# Patient Record
Sex: Male | Born: 1955 | Race: White | Hispanic: No | Marital: Single | Smoking: Former smoker
Health system: Southern US, Community
[De-identification: ages and names within clinical notes are randomized; demographics above are authoritative.]

## PROBLEM LIST (undated history)

## (undated) DIAGNOSIS — M25569 Pain in unspecified knee: Secondary | ICD-10-CM

## (undated) DIAGNOSIS — F410 Panic disorder [episodic paroxysmal anxiety] without agoraphobia: Secondary | ICD-10-CM

## (undated) DIAGNOSIS — T783XXA Angioneurotic edema, initial encounter: Secondary | ICD-10-CM

## (undated) DIAGNOSIS — J069 Acute upper respiratory infection, unspecified: Secondary | ICD-10-CM

## (undated) DIAGNOSIS — F329 Major depressive disorder, single episode, unspecified: Secondary | ICD-10-CM

## (undated) DIAGNOSIS — F32A Depression, unspecified: Secondary | ICD-10-CM

## (undated) DIAGNOSIS — L309 Dermatitis, unspecified: Secondary | ICD-10-CM

## (undated) DIAGNOSIS — M549 Dorsalgia, unspecified: Secondary | ICD-10-CM

## (undated) DIAGNOSIS — J45909 Unspecified asthma, uncomplicated: Secondary | ICD-10-CM

## (undated) DIAGNOSIS — M1990 Unspecified osteoarthritis, unspecified site: Secondary | ICD-10-CM

## (undated) DIAGNOSIS — F988 Other specified behavioral and emotional disorders with onset usually occurring in childhood and adolescence: Secondary | ICD-10-CM

## (undated) DIAGNOSIS — Z87442 Personal history of urinary calculi: Secondary | ICD-10-CM

## (undated) DIAGNOSIS — G4700 Insomnia, unspecified: Secondary | ICD-10-CM

## (undated) DIAGNOSIS — K649 Unspecified hemorrhoids: Secondary | ICD-10-CM

## (undated) DIAGNOSIS — I38 Endocarditis, valve unspecified: Secondary | ICD-10-CM

## (undated) DIAGNOSIS — J449 Chronic obstructive pulmonary disease, unspecified: Secondary | ICD-10-CM

## (undated) HISTORY — DX: Unspecified osteoarthritis, unspecified site: M19.90

## (undated) HISTORY — DX: Dermatitis, unspecified: L30.9

## (undated) HISTORY — DX: Panic disorder (episodic paroxysmal anxiety): F41.0

## (undated) HISTORY — DX: Unspecified hemorrhoids: K64.9

## (undated) HISTORY — DX: Endocarditis, valve unspecified: I38

## (undated) HISTORY — DX: Insomnia, unspecified: G47.00

## (undated) HISTORY — DX: Other specified behavioral and emotional disorders with onset usually occurring in childhood and adolescence: F98.8

## (undated) HISTORY — DX: Dorsalgia, unspecified: M54.9

## (undated) HISTORY — DX: Acute upper respiratory infection, unspecified: J06.9

## (undated) HISTORY — DX: Chronic obstructive pulmonary disease, unspecified: J44.9

## (undated) HISTORY — DX: Pain in unspecified knee: M25.569

## (undated) HISTORY — DX: Depression, unspecified: F32.A

## (undated) HISTORY — DX: Major depressive disorder, single episode, unspecified: F32.9

## (undated) HISTORY — DX: Angioneurotic edema, initial encounter: T78.3XXA

---

## 2014-02-10 ENCOUNTER — Ambulatory Visit (HOSPITAL_COMMUNITY)
Admission: RE | Admit: 2014-02-10 | Discharge: 2014-02-10 | Disposition: A | Discharge status: Home / Self Care | Payer: Medicaid Other | Source: Ambulatory Visit | Attending: Internal Medicine | Admitting: Internal Medicine

## 2014-02-10 ENCOUNTER — Other Ambulatory Visit (HOSPITAL_COMMUNITY): Payer: Self-pay | Admitting: Internal Medicine

## 2014-02-10 DIAGNOSIS — F17200 Nicotine dependence, unspecified, uncomplicated: Secondary | ICD-10-CM | POA: Insufficient documentation

## 2014-02-10 DIAGNOSIS — J449 Chronic obstructive pulmonary disease, unspecified: Secondary | ICD-10-CM

## 2014-02-10 DIAGNOSIS — J4489 Other specified chronic obstructive pulmonary disease: Secondary | ICD-10-CM | POA: Insufficient documentation

## 2015-09-26 ENCOUNTER — Telehealth: Payer: Self-pay

## 2015-09-26 NOTE — Telephone Encounter (Signed)
Tried to call pt to make appt. He was referred by Dr. Legrand Rams for colonoscopy. See med list, needs OV.

## 2015-09-27 NOTE — Telephone Encounter (Signed)
Pt called and he does have some problems with hemorrhoids and also needs ov due to med list. OV with Walden Field, NP for 10/16/2015 at 8:30 Am.

## 2015-10-16 ENCOUNTER — Ambulatory Visit (INDEPENDENT_AMBULATORY_CARE_PROVIDER_SITE_OTHER): Payer: Medicaid Other | Admitting: Nurse Practitioner

## 2015-10-16 ENCOUNTER — Other Ambulatory Visit: Payer: Self-pay

## 2015-10-16 ENCOUNTER — Encounter: Payer: Self-pay | Admitting: Nurse Practitioner

## 2015-10-16 VITALS — BP 109/72 | HR 76 | Temp 97.6°F | Ht 73.0 in | Wt 266.0 lb

## 2015-10-16 DIAGNOSIS — Z1211 Encounter for screening for malignant neoplasm of colon: Secondary | ICD-10-CM

## 2015-10-16 DIAGNOSIS — K649 Unspecified hemorrhoids: Secondary | ICD-10-CM | POA: Diagnosis not present

## 2015-10-16 HISTORY — PX: OTHER SURGICAL HISTORY: SHX169

## 2015-10-16 MED ORDER — PEG 3350-KCL-NA BICARB-NACL 420 G PO SOLR: 4000.0000 mL | ORAL | Status: DC

## 2015-10-16 MED ORDER — HYDROCORTISONE 2.5 % RE CREA: 1.0000 "application " | TOPICAL_CREAM | Freq: Two times a day (BID) | RECTAL | Status: DC

## 2015-10-16 NOTE — Patient Instructions (Signed)
1. We will schedule your procedure for you. 2. I sent in a prescription to your pharmacy for Anusol cream. Apply this twice a day for hemorrhoid symptoms for up to 10 days at a time. 3. Return for follow-up as needed for any new or worsening stomach or colon symptoms.

## 2015-10-16 NOTE — Progress Notes (Signed)
Primary Care Physician:  [name], MD Primary Gastroenterologist:  Dr. Gala Romney  Chief Complaint  Patient presents with  . set up TCS    HPI:   59 year old male presents for evaluation for screening colonoscopy. PCP notes reviewed. Patient brought in for office visit due to hemorrhoids symptoms as well as medication list which may necessitate additional sedation for his procedure. No record of colonoscopy found in our system nor provided by PCP. Today he states he has never had a colonoscopy before. Denies abdominal pain, N/V. Has hemorrhoids without hematochezia. Denies melena. Has some anal leakage with gas for the past 8-10 years. Denies fever, chills, unintentional weight loss. Has hemorrhoids which bother him occasionally with rectal irritation/pain occasionally. Denies chest pain, dyspnea, dizziness, lightheadedness, syncope, near syncope. Denies any other upper or lower GI symptoms.  Past Medical History  Diagnosis Date  . Hemorrhoids   . COPD (chronic obstructive pulmonary disease) ([place])   . ADD (attention deficit disorder)   . Arthritis   . Back pain   . Knee pain   . 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 Depression   . Panic attacks   . Insomnia     Past Surgical History  Procedure Laterality Date  . None to date  10/16/15    Current Outpatient Prescriptions  Medication Sig Dispense Refill  . albuterol (PROVENTIL) (2.5 MG/3ML) 0.083% nebulizer solution Take 2.5 mg by nebulization every 6 (six) hours as needed for wheezing or shortness of breath.    . citalopram (CELEXA) 20 MG tablet Take 20 mg by mouth daily.    . cyclobenzaprine (FLEXERIL) 10 MG tablet Take 10 mg by mouth 3 (three) times daily.    . fluticasone (FLONASE) 50 MCG/ACT nasal spray Place 1 spray into both nostrils daily.    . Fluticasone-Salmeterol (ADVAIR) 250-50 MCG/DOSE AEPB Inhale 2 puffs into the lungs 2 (two) times daily.    . Influenza Virus Vacc  Split PF (AFLURIA PRESERVATIVE FREE) 0.5 ML SUSY Inject into the muscle.    . Influenza Virus Vaccine Split (AFLURIA) SUSP Inject 0.5 mLs into the muscle.    . loratadine (CLARITIN) 10 MG tablet Take 10 mg by mouth daily.    . meloxicam (MOBIC) 15 MG tablet Take 15 mg by mouth daily.    Marland Kitchen tiotropium (SPIRIVA) 18 MCG inhalation capsule Place 18 mcg into inhaler and inhale daily.    . traZODone (DESYREL) 150 MG tablet Take 100 mg by mouth at bedtime as needed for sleep.     No current facility-administered medications for this visit.    Allergies as of 10/16/2015 - Review Complete 10/16/2015  Allergen Reaction Noted  . Iodine  10/16/2015  . Shellfish allergy Swelling 10/16/2015    Family History  Problem Relation Age of Onset  . Colon cancer Neg Hx     Social History   Social History  . Marital Status: Single    Spouse Name: N/A  . Number of Children: N/A  . Years of Education: N/A   Occupational History  . Not on file.   Social History Main Topics  . Smoking status: Current Every Day Smoker -- 1.00 packs/day for 45 years    Types: Cigarettes  . Smokeless tobacco: Never Used  . 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 Drug  Use: No  . Sexual Activity: Not on file   Other Topics Concern  . Not on file   Social History Narrative  . No narrative on file    Review of Systems: General: Negative for anorexia, weight loss, fever, chills, fatigue, weakness. Eyes: Negative for vision changes.  ENT: Negative for hoarseness, difficulty swallowing. CV: Negative for chest pain, angina, palpitations, peripheral edema.  Respiratory: Negative for dyspnea at rest, worsening dyspnea on exertion, cough, sputum, wheezing (has baseline COPD).  GI: See history of present illness. Derm: Negative for rash or itching.  Endo: Negative for unusual weight change.  Heme:  Negative for bruising or bleeding. Allergy: Negative for rash or hives.    Physical Exam: BP 109/72 mmHg  Pulse 76  Temp(Src) 97.6 F (36.4 C)  Ht 6\' 1"  (1.854 m)  Wt 266 lb (120.657 kg)  BMI 35.10 kg/m2 General:   Morbidly obese male, alert and oriented. Pleasant and cooperative. Well-nourished and well-developed.  Head:  Normocephalic and atraumatic. Eyes:  Without icterus, sclera clear and conjunctiva pink.  Ears:  Normal auditory acuity. Cardiovascular:  S1, S2 present without murmurs appreciated. Extremities without clubbing or edema. Respiratory:  Clear to auscultation bilaterally. Bilateral wheezes, no rales or rhonchi. No distress.  Gastrointestinal:  +BS, large but soft, non-tender and non-distended. No HSM noted. No guarding or rebound. No masses appreciated.  Rectal:  Deferred  Neurologic:  Alert and oriented x4;  grossly normal neurologically. Psych:  Alert and cooperative. Normal mood and affect. Heme/Lymph/Immune: No excessive bruising noted.    10/16/2015 9:04 AM

## 2015-10-17 NOTE — Progress Notes (Signed)
cc'ed to pcp °

## 2015-10-17 NOTE — Assessment & Plan Note (Signed)
Patient with hemorrhoids and occasional flareups. We'll provide for Anusol cream to use as needed for symptoms. Return for follow-up as needed.

## 2015-10-17 NOTE — Assessment & Plan Note (Signed)
Patient due for colonoscopy, is generally asymptomatic from a GI standpoint. We'll move forward with screening colonoscopy in the OR/propofol due to polypharmacy  Proceed with TCS in the OR with propofol/MAC with Dr. Gala Romney in near future: the risks, benefits, and alternatives have been discussed with the patient in detail. The patient states understanding and desires to proceed.  Patient is not on any anticoagulants. The patient is on trazodone, Flexeril, and Celexa. We'll provide for the procedure and the OR with propofol/MAC to promote adequate sedation.

## 2015-10-23 NOTE — Patient Instructions (Signed)
Stephen Warren  10/23/2015     @PREFPERIOPPHARMACY @   Your procedure is scheduled on 10/30/15.  Report to [facility] at 8:30 A.M.  Call this number if you have problems the morning of surgery:  [phone]   Remember:  Do not eat food or drink liquids after midnight.  Take these medicines the morning of surgery with A SIP OF WATER Albuterol (bring to hospital), Celexa, Flexeril, Flonase, Ativan, Claritin, Mobic, Spiriva   Do not wear jewelry, make-up or nail polish.  Do not wear lotions, powders, or perfumes.  You may wear deodorant.  Do not shave 48 hours prior to surgery.  Men may shave face and neck.  Do not bring valuables to the hospital.  [facility] is not responsible for any belongings or valuables.  Contacts, dentures or bridgework may not be worn into surgery.  Leave your suitcase in the car.  After surgery it may be brought to your room.  For patients admitted to the hospital, discharge time will be determined by your treatment team.  Patients discharged the day of surgery will not be allowed to drive home.    Please read over the following fact sheets that you were given. Anesthesia Post-op Instructions     PATIENT INSTRUCTIONS POST-ANESTHESIA  IMMEDIATELY FOLLOWING SURGERY:  Do not drive or operate machinery for the first twenty four hours after surgery.  Do not make any important decisions for twenty four hours after surgery or while taking narcotic pain medications or sedatives.  If you develop intractable nausea and vomiting or a severe headache please notify your doctor immediately.  FOLLOW-UP:  Please make an appointment with your surgeon as instructed. You do not need to follow up with anesthesia unless specifically instructed to do so.  WOUND CARE INSTRUCTIONS (if applicable):  Keep a dry clean dressing on the anesthesia/puncture wound site if there is drainage.  Once the wound has quit draining you may leave it open to air.  Generally you should leave  the bandage intact for twenty four hours unless there is drainage.  If the epidural site drains for more than 36-48 hours please call the anesthesia department.  QUESTIONS?:  Please feel free to call your physician or the hospital operator if you have any questions, and they will be happy to assist you.      Colonoscopy A colonoscopy is an exam to look at the entire large intestine (colon). This exam can help find problems such as tumors, polyps, inflammation, and areas of bleeding. The exam takes about 1 hour.  LET [facility] CARE PROVIDER KNOW ABOUT:   Any allergies you have.  All medicines you are taking, including vitamins, herbs, eye drops, creams, and over-the-counter medicines.  Previous problems you or members of your family have had with the use of anesthetics.  Any blood disorders you have.  Previous surgeries you have had.  Medical conditions you have. RISKS AND COMPLICATIONS  Generally, this is a safe procedure. However, as with any procedure, complications can occur. Possible complications include:  Bleeding.  Tearing or rupture of the colon wall.  Reaction to medicines given during the exam.  Infection (rare). BEFORE THE PROCEDURE   Ask your health care provider about changing or stopping your regular medicines.  You may be prescribed an oral bowel prep. This involves drinking a large amount of medicated liquid, starting the day before your procedure. The liquid will cause you to have multiple loose stools until your stool is almost clear or light  green. This cleans out your colon in preparation for the procedure.  Do not eat or drink anything else once you have started the bowel prep, unless your health care provider tells you it is safe to do so.  Arrange for someone to drive you home after the procedure. PROCEDURE   You will be given medicine to help you relax (sedative).  You will lie on your side with your knees bent.  A long, flexible tube with a  light and camera on the end (colonoscope) will be inserted through the rectum and into the colon. The camera sends video back to a computer screen as it moves through the colon. The colonoscope also releases carbon dioxide gas to inflate the colon. This helps your health care provider see the area better.  During the exam, your health care provider may take a small tissue sample (biopsy) to be examined under a microscope if any abnormalities are found.  The exam is finished when the entire colon has been viewed. AFTER THE PROCEDURE   Do not drive for 24 hours after the exam.  You may have a small amount of blood in your stool.  You may pass moderate amounts of gas and have mild abdominal cramping or bloating. This is caused by the gas used to inflate your colon during the exam.  Ask when your test results will be ready and how you will get your results. Make sure you get your test results.   This information is not intended to replace advice given to you by your health care provider. Make sure you discuss any questions you have with your health care provider.   Document Released: 10/25/2000 Document Revised: 08/18/2013 Document Reviewed: 07/05/2013 Elsevier Interactive Patient Education [employer].

## 2015-10-25 ENCOUNTER — Encounter (HOSPITAL_COMMUNITY): Payer: Self-pay

## 2015-10-25 ENCOUNTER — Encounter (HOSPITAL_COMMUNITY)
Admission: RE | Admit: 2015-10-25 | Discharge: 2015-10-25 | Disposition: A | Discharge status: Home / Self Care | Payer: Medicaid Other | Source: Ambulatory Visit | Attending: Internal Medicine | Admitting: Internal Medicine

## 2015-10-25 ENCOUNTER — Other Ambulatory Visit: Payer: Self-pay

## 2015-10-25 DIAGNOSIS — Z01818 Encounter for other preprocedural examination: Secondary | ICD-10-CM | POA: Insufficient documentation

## 2015-10-25 LAB — BASIC METABOLIC PANEL
ANION GAP: 7 (ref 5–15)
BUN: 19 mg/dL (ref 6–20)
CO2: 26 mmol/L (ref 22–32)
Calcium: 8.9 mg/dL (ref 8.9–10.3)
Chloride: 103 mmol/L (ref 101–111)
Creatinine, Ser: 0.88 mg/dL (ref 0.61–1.24)
GFR calc Af Amer: >60 mL/min (ref >60)
GFR calc non Af Amer: >60 mL/min (ref >60)
GLUCOSE: 129 mg/dL — AB (ref 65–99)
Potassium: 3.9 mmol/L (ref 3.5–5.1)
Sodium: 136 mmol/L (ref 135–145)

## 2015-10-25 LAB — CBC
HEMATOCRIT: 40.8 % (ref 39.0–52.0)
Hemoglobin: 13.9 g/dL (ref 13.0–17.0)
MCH: 31.9 pg (ref 26.0–34.0)
MCHC: 34.1 g/dL (ref 30.0–36.0)
MCV: 93.6 fL (ref 78.0–100.0)
Platelets: 223 10*3/uL (ref 150–400)
RBC: 4.36 MIL/uL (ref 4.22–5.81)
RDW: 12 % (ref 11.5–15.5)
WBC: 8.7 10*3/uL (ref 4.0–10.5)

## 2015-10-30 ENCOUNTER — Ambulatory Visit (HOSPITAL_COMMUNITY): Payer: Medicaid Other | Admitting: Anesthesiology

## 2015-10-30 ENCOUNTER — Ambulatory Visit (HOSPITAL_COMMUNITY)
Admission: RE | Admit: 2015-10-30 | Discharge: 2015-10-30 | Disposition: A | Discharge status: Home / Self Care | Payer: Medicaid Other | Source: Ambulatory Visit | Attending: Internal Medicine | Admitting: Internal Medicine

## 2015-10-30 ENCOUNTER — Encounter (HOSPITAL_COMMUNITY)
Admission: RE | Disposition: A | Discharge status: Home / Self Care | Payer: Self-pay | Source: Ambulatory Visit | Attending: Internal Medicine

## 2015-10-30 ENCOUNTER — Encounter (HOSPITAL_COMMUNITY): Payer: Self-pay | Admitting: *Deleted

## 2015-10-30 DIAGNOSIS — M545 Low back pain: Secondary | ICD-10-CM | POA: Insufficient documentation

## 2015-10-30 DIAGNOSIS — K6389 Other specified diseases of intestine: Secondary | ICD-10-CM | POA: Insufficient documentation

## 2015-10-30 DIAGNOSIS — D125 Benign neoplasm of sigmoid colon: Secondary | ICD-10-CM | POA: Insufficient documentation

## 2015-10-30 DIAGNOSIS — F410 Panic disorder [episodic paroxysmal anxiety] without agoraphobia: Secondary | ICD-10-CM | POA: Diagnosis not present

## 2015-10-30 DIAGNOSIS — Z1211 Encounter for screening for malignant neoplasm of colon: Secondary | ICD-10-CM | POA: Diagnosis present

## 2015-10-30 DIAGNOSIS — G4700 Insomnia, unspecified: Secondary | ICD-10-CM | POA: Diagnosis not present

## 2015-10-30 DIAGNOSIS — Z91041 Radiographic dye allergy status: Secondary | ICD-10-CM | POA: Diagnosis not present

## 2015-10-30 DIAGNOSIS — K633 Ulcer of intestine: Secondary | ICD-10-CM | POA: Diagnosis not present

## 2015-10-30 DIAGNOSIS — F988 Other specified behavioral and emotional disorders with onset usually occurring in childhood and adolescence: Secondary | ICD-10-CM | POA: Diagnosis not present

## 2015-10-30 DIAGNOSIS — K5730 Diverticulosis of large intestine without perforation or abscess without bleeding: Secondary | ICD-10-CM | POA: Diagnosis not present

## 2015-10-30 DIAGNOSIS — K639 Disease of intestine, unspecified: Secondary | ICD-10-CM | POA: Diagnosis not present

## 2015-10-30 DIAGNOSIS — F17210 Nicotine dependence, cigarettes, uncomplicated: Secondary | ICD-10-CM | POA: Diagnosis not present

## 2015-10-30 DIAGNOSIS — Z860100 Personal history of colon polyps, unspecified: Secondary | ICD-10-CM | POA: Insufficient documentation

## 2015-10-30 DIAGNOSIS — J449 Chronic obstructive pulmonary disease, unspecified: Secondary | ICD-10-CM | POA: Diagnosis not present

## 2015-10-30 DIAGNOSIS — Z800 Family history of malignant neoplasm of digestive organs: Secondary | ICD-10-CM | POA: Diagnosis not present

## 2015-10-30 DIAGNOSIS — Z91013 Allergy to seafood: Secondary | ICD-10-CM | POA: Insufficient documentation

## 2015-10-30 DIAGNOSIS — I38 Endocarditis, valve unspecified: Secondary | ICD-10-CM | POA: Diagnosis not present

## 2015-10-30 DIAGNOSIS — K648 Other hemorrhoids: Secondary | ICD-10-CM | POA: Diagnosis not present

## 2015-10-30 DIAGNOSIS — Q438 Other specified congenital malformations of intestine: Secondary | ICD-10-CM | POA: Insufficient documentation

## 2015-10-30 DIAGNOSIS — Z79899 Other long term (current) drug therapy: Secondary | ICD-10-CM | POA: Insufficient documentation

## 2015-10-30 DIAGNOSIS — M25569 Pain in unspecified knee: Secondary | ICD-10-CM | POA: Insufficient documentation

## 2015-10-30 DIAGNOSIS — F329 Major depressive disorder, single episode, unspecified: Secondary | ICD-10-CM | POA: Diagnosis not present

## 2015-10-30 DIAGNOSIS — Z7951 Long term (current) use of inhaled steroids: Secondary | ICD-10-CM | POA: Insufficient documentation

## 2015-10-30 HISTORY — PX: POLYPECTOMY: SHX5525

## 2015-10-30 HISTORY — PX: BIOPSY: SHX5522

## 2015-10-30 HISTORY — PX: COLONOSCOPY WITH PROPOFOL: SHX5780

## 2015-10-30 SURGERY — COLONOSCOPY WITH PROPOFOL
Anesthesia: Monitor Anesthesia Care

## 2015-10-30 MED ORDER — DEXAMETHASONE SODIUM PHOSPHATE 4 MG/ML IJ SOLN
4.0000 mg | Freq: Once | INTRAMUSCULAR | Status: AC
  Administered 2015-10-30: 4 mg via INTRAVENOUS

## 2015-10-30 MED ORDER — PROPOFOL 10 MG/ML IV BOLUS
INTRAVENOUS | Status: AC
  Filled 2015-10-30: qty 40

## 2015-10-30 MED ORDER — FENTANYL CITRATE (PF) 100 MCG/2ML IJ SOLN
25.0000 ug | INTRAMUSCULAR | Status: AC
  Administered 2015-10-30 (×2): 25 ug via INTRAVENOUS

## 2015-10-30 MED ORDER — MIDAZOLAM HCL 2 MG/2ML IJ SOLN
1.0000 mg | INTRAMUSCULAR | Status: DC | PRN
  Administered 2015-10-30: 2 mg via INTRAVENOUS

## 2015-10-30 MED ORDER — MIDAZOLAM HCL 2 MG/2ML IJ SOLN
INTRAMUSCULAR | Status: AC
  Filled 2015-10-30: qty 2

## 2015-10-30 MED ORDER — MIDAZOLAM HCL 5 MG/5ML IJ SOLN
INTRAMUSCULAR | Status: DC | PRN
  Administered 2015-10-30: 2 mg via INTRAVENOUS

## 2015-10-30 MED ORDER — DEXAMETHASONE SODIUM PHOSPHATE 4 MG/ML IJ SOLN
INTRAMUSCULAR | Status: AC
  Filled 2015-10-30: qty 1

## 2015-10-30 MED ORDER — METOPROLOL TARTRATE 1 MG/ML IV SOLN
INTRAVENOUS | Status: DC | PRN
  Administered 2015-10-30: 2 mg via INTRAVENOUS

## 2015-10-30 MED ORDER — ONDANSETRON HCL 4 MG/2ML IJ SOLN: 4.0000 mg | Freq: Once | INTRAMUSCULAR | Status: DC | PRN

## 2015-10-30 MED ORDER — METOPROLOL TARTRATE 1 MG/ML IV SOLN
INTRAVENOUS | Status: AC
  Filled 2015-10-30: qty 5

## 2015-10-30 MED ORDER — LACTATED RINGERS IV SOLN
INTRAVENOUS | Status: DC
  Administered 2015-10-30: 10:00:00 via INTRAVENOUS

## 2015-10-30 MED ORDER — FENTANYL CITRATE (PF) 100 MCG/2ML IJ SOLN: 25.0000 ug | INTRAMUSCULAR | Status: DC | PRN

## 2015-10-30 MED ORDER — FENTANYL CITRATE (PF) 100 MCG/2ML IJ SOLN
INTRAMUSCULAR | Status: AC
  Filled 2015-10-30: qty 2

## 2015-10-30 MED ORDER — PROPOFOL 500 MG/50ML IV EMUL
INTRAVENOUS | Status: DC | PRN
  Administered 2015-10-30: 125 ug/kg/min via INTRAVENOUS

## 2015-10-30 NOTE — H&P (View-Only) (Signed)
Primary Care Physician:  [name], MD Primary Gastroenterologist:  Dr. Gala Romney  Chief Complaint  Patient presents with  . set up TCS    HPI:   59 year old male presents for evaluation for screening colonoscopy. PCP notes reviewed. Patient brought in for office visit due to hemorrhoids symptoms as well as medication list which may necessitate additional sedation for his procedure. No record of colonoscopy found in our system nor provided by PCP. Today he states he has never had a colonoscopy before. Denies abdominal pain, N/V. Has hemorrhoids without hematochezia. Denies melena. Has some anal leakage with gas for the past 8-10 years. Denies fever, chills, unintentional weight loss. Has hemorrhoids which bother him occasionally with rectal irritation/pain occasionally. Denies chest pain, dyspnea, dizziness, lightheadedness, syncope, near syncope. Denies any other upper or lower GI symptoms.  Past Medical History  Diagnosis Date  . Hemorrhoids   . COPD (chronic obstructive pulmonary disease) ([place])   . ADD (attention deficit disorder)   . Arthritis   . Back pain   . Knee pain   . 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 Depression   . Panic attacks   . Insomnia     Past Surgical History  Procedure Laterality Date  . None to date  10/16/15    Current Outpatient Prescriptions  Medication Sig Dispense Refill  . albuterol (PROVENTIL) (2.5 MG/3ML) 0.083% nebulizer solution Take 2.5 mg by nebulization every 6 (six) hours as needed for wheezing or shortness of breath.    . citalopram (CELEXA) 20 MG tablet Take 20 mg by mouth daily.    . cyclobenzaprine (FLEXERIL) 10 MG tablet Take 10 mg by mouth 3 (three) times daily.    . fluticasone (FLONASE) 50 MCG/ACT nasal spray Place 1 spray into both nostrils daily.    . Fluticasone-Salmeterol (ADVAIR) 250-50 MCG/DOSE AEPB Inhale 2 puffs into the lungs 2 (two) times daily.    . Influenza Virus Vacc  Split PF (AFLURIA PRESERVATIVE FREE) 0.5 ML SUSY Inject into the muscle.    . Influenza Virus Vaccine Split (AFLURIA) SUSP Inject 0.5 mLs into the muscle.    . loratadine (CLARITIN) 10 MG tablet Take 10 mg by mouth daily.    . meloxicam (MOBIC) 15 MG tablet Take 15 mg by mouth daily.    Marland Kitchen tiotropium (SPIRIVA) 18 MCG inhalation capsule Place 18 mcg into inhaler and inhale daily.    . traZODone (DESYREL) 150 MG tablet Take 100 mg by mouth at bedtime as needed for sleep.     No current facility-administered medications for this visit.    Allergies as of 10/16/2015 - Review Complete 10/16/2015  Allergen Reaction Noted  . Iodine  10/16/2015  . Shellfish allergy Swelling 10/16/2015    Family History  Problem Relation Age of Onset  . Colon cancer Neg Hx     Social History   Social History  . Marital Status: Single    Spouse Name: N/A  . Number of Children: N/A  . Years of Education: N/A   Occupational History  . Not on file.   Social History Main Topics  . Smoking status: Current Every Day Smoker -- 1.00 packs/day for 45 years    Types: Cigarettes  . Smokeless tobacco: Never Used  . 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 Drug  Use: No  . Sexual Activity: Not on file   Other Topics Concern  . Not on file   Social History Narrative  . No narrative on file    Review of Systems: General: Negative for anorexia, weight loss, fever, chills, fatigue, weakness. Eyes: Negative for vision changes.  ENT: Negative for hoarseness, difficulty swallowing. CV: Negative for chest pain, angina, palpitations, peripheral edema.  Respiratory: Negative for dyspnea at rest, worsening dyspnea on exertion, cough, sputum, wheezing (has baseline COPD).  GI: See history of present illness. Derm: Negative for rash or itching.  Endo: Negative for unusual weight change.  Heme:  Negative for bruising or bleeding. Allergy: Negative for rash or hives.    Physical Exam: BP 109/72 mmHg  Pulse 76  Temp(Src) 97.6 F (36.4 C)  Ht 6\' 1"  (1.854 m)  Wt 266 lb (120.657 kg)  BMI 35.10 kg/m2 General:   Morbidly obese male, alert and oriented. Pleasant and cooperative. Well-nourished and well-developed.  Head:  Normocephalic and atraumatic. Eyes:  Without icterus, sclera clear and conjunctiva pink.  Ears:  Normal auditory acuity. Cardiovascular:  S1, S2 present without murmurs appreciated. Extremities without clubbing or edema. Respiratory:  Clear to auscultation bilaterally. Bilateral wheezes, no rales or rhonchi. No distress.  Gastrointestinal:  +BS, large but soft, non-tender and non-distended. No HSM noted. No guarding or rebound. No masses appreciated.  Rectal:  Deferred  Neurologic:  Alert and oriented x4;  grossly normal neurologically. Psych:  Alert and cooperative. Normal mood and affect. Heme/Lymph/Immune: No excessive bruising noted.    10/16/2015 9:04 AM

## 2015-10-30 NOTE — Anesthesia Preprocedure Evaluation (Signed)
Anesthesia Evaluation  Patient identified by MRN, date of birth, ID band Patient awake    Reviewed: Allergy & Precautions, NPO status , Patient's Chart, lab work & pertinent test results  Airway Mallampati: II  TM Distance: >3 FB     Dental  (+) Edentulous Upper, Partial Lower   Pulmonary COPD, Current Smoker,    breath sounds clear to auscultation       Cardiovascular + Valvular Problems/Murmurs  Rhythm:Regular Rate:Normal     Neuro/Psych PSYCHIATRIC DISORDERS (ADD, Panic attacks) Anxiety Depression    GI/Hepatic   Endo/Other    Renal/GU      Musculoskeletal   Abdominal   Peds  Hematology   Anesthesia Other Findings   Reproductive/Obstetrics                             Anesthesia Physical Anesthesia Plan  ASA: III  Anesthesia Plan: MAC   Post-op Pain Management:    Induction: Intravenous  Airway Management Planned: Simple Face Mask  Additional Equipment:   Intra-op Plan:   Post-operative Plan:   Informed Consent: I have reviewed the patients History and Physical, chart, labs and discussed the procedure including the risks, benefits and alternatives for the proposed anesthesia with the patient or authorized representative who has indicated his/her understanding and acceptance.     Plan Discussed with:   Anesthesia Plan Comments:         Anesthesia Quick Evaluation

## 2015-10-30 NOTE — Anesthesia Postprocedure Evaluation (Signed)
Anesthesia Post Note  Patient: Stephen Warren  Procedure(s) Performed: Procedure(s) (LRB): COLONOSCOPY WITH PROPOFOL (N/A) POLYPECTOMY BIOPSY  Patient location during evaluation: PACU Anesthesia Type: MAC Level of consciousness: awake and patient cooperative Pain management: pain level controlled Vital Signs Assessment: post-procedure vital signs reviewed and stable Respiratory status: nonlabored ventilation and patient connected to face mask oxygen Cardiovascular status: blood pressure returned to baseline Postop Assessment: no signs of nausea or vomiting Anesthetic complications: no    Last Vitals:  Filed Vitals:   10/30/15 1015 10/30/15 1020  BP: 118/74 117/61  Temp:    Resp: 20 0    Last Pain: There were no vitals filed for this visit.               Eldwin Volkov J

## 2015-10-30 NOTE — Interval H&P Note (Signed)
History and Physical Interval Note:  10/30/2015 10:20 AM  Stephen Warren  has presented today for surgery, with the diagnosis of SCREENING  The various methods of treatment have been discussed with the patient and family. After consideration of risks, benefits and other options for treatment, the patient has consented to  Procedure(s) with comments: COLONOSCOPY WITH PROPOFOL (N/A) - 1000  as a surgical intervention .  The patient's history has been reviewed, patient examined, no change in status, stable for surgery.  I have reviewed the patient's chart and labs.  Questions were answered to the patient's satisfaction.     Peg Fifer  No change. First ever screening colonoscopy per plan.  The risks, benefits, limitations, alternatives and imponderables have been reviewed with the patient. Questions have been answered. All parties are agreeable.

## 2015-10-30 NOTE — Transfer of Care (Signed)
Immediate Anesthesia Transfer of Care Note  Patient: Stephen Warren  Procedure(s) Performed: Procedure(s) with comments: COLONOSCOPY WITH PROPOFOL (N/A) - 1000  POLYPECTOMY - sigmoid polypectomy  x3 BIOPSY - ileocecal valve ulcer  Patient Location: PACU  Anesthesia Type:MAC  Level of Consciousness: awake and patient cooperative  Airway & Oxygen Therapy: Patient Spontanous Breathing and Patient connected to face mask oxygen  Post-op Assessment: Report given to RN, Post -op Vital signs reviewed and stable and Patient moving all extremities  Post vital signs: Reviewed and stable  Last Vitals:  Filed Vitals:   10/30/15 1015 10/30/15 1020  BP: 118/74 117/61  Temp:    Resp: 20 0    Complications: No apparent anesthesia complications

## 2015-10-30 NOTE — Discharge Instructions (Signed)
°Colonoscopy °Discharge Instructions ° °Read the instructions outlined below and refer to this sheet in the next few weeks. These discharge instructions provide you with general information on caring for yourself after you leave the hospital. Your doctor may also give you specific instructions. While your treatment has been planned according to the most current medical practices available, unavoidable complications occasionally occur. If you have any problems or questions after discharge, call Dr. Rourk at [phone]. °ACTIVITY °· You may resume your regular activity, but move at a slower pace for the next 24 hours.  °· Take frequent rest periods for the next 24 hours.  °· Walking will help get rid of the air and reduce the bloated feeling in your belly (abdomen).  °· No driving for 24 hours (because of the medicine (anesthesia) used during the test).   °· Do not sign any important legal documents or operate any machinery for 24 hours (because of the anesthesia used during the test).  °NUTRITION °· Drink plenty of fluids.  °· You may resume your normal diet as instructed by your doctor.  °· Begin with a light meal and progress to your normal diet. Heavy or fried foods are harder to digest and may make you feel sick to your stomach (nauseated).  °· Avoid alcoholic beverages for 24 hours or as instructed.  °MEDICATIONS °· You may resume your normal medications unless your doctor tells you otherwise.  °WHAT YOU CAN EXPECT TODAY °· Some feelings of bloating in the abdomen.  °· Passage of more gas than usual.  °· Spotting of blood in your stool or on the toilet paper.  °IF YOU HAD POLYPS REMOVED DURING THE COLONOSCOPY: °· No aspirin products for 7 days or as instructed.  °· No alcohol for 7 days or as instructed.  °· Eat a soft diet for the next 24 hours.  °FINDING OUT THE RESULTS OF YOUR TEST °Not all test results are available during your visit. If your test results are not back during the visit, make an appointment  with your caregiver to find out the results. Do not assume everything is normal if you have not heard from your caregiver or the medical facility. It is important for you to follow up on all of your test results.  °SEEK IMMEDIATE MEDICAL ATTENTION IF: °· You have more than a spotting of blood in your stool.  °· Your belly is swollen (abdominal distention).  °· You are nauseated or vomiting.  °· You have a temperature over 101.  °· You have abdominal pain or discomfort that is severe or gets worse throughout the day.  ° ° °Diverticulosis and colon polyp information provided ° °Further recommendations to follow pending review of pathology report ° ° °Diverticulosis °Diverticulosis is the condition that develops when small pouches (diverticula) form in the wall of your colon. Your colon, or large intestine, is where water is absorbed and stool is formed. The pouches form when the inside layer of your colon pushes through weak spots in the outer layers of your colon. °CAUSES  °No one knows exactly what causes diverticulosis. °RISK FACTORS °· Being older than 50. Your risk for this condition increases with age. Diverticulosis is rare in people younger than 40 years. By age 80, almost everyone has it. °· Eating a low-fiber diet. °· Being frequently constipated. °· Being overweight. °· Not getting enough exercise. °· Smoking. °· Taking over-the-counter pain medicines, like aspirin and ibuprofen. °SYMPTOMS  °Most people with diverticulosis do not have symptoms. °DIAGNOSIS  °  Because diverticulosis often has no symptoms, health care providers often discover the condition during an exam for other colon problems. In many cases, a health care provider will diagnose diverticulosis while using a flexible scope to examine the colon (colonoscopy). °TREATMENT  °If you have never developed an infection related to diverticulosis, you may not need treatment. If you have had an infection before, treatment may include: °· Eating more  fruits, vegetables, and grains. °· Taking a fiber supplement. °· Taking a live bacteria supplement (probiotic). °· Taking medicine to relax your colon. °HOME CARE INSTRUCTIONS  °· Drink at least 6-8 glasses of water each day to prevent constipation. °· Try not to strain when you have a bowel movement. °· Keep all follow-up appointments. °If you have had an infection before:  °· Increase the fiber in your diet as directed by your health care provider or dietitian. °· Take a dietary fiber supplement if your health care provider approves. °· Only take medicines as directed by your health care provider. °SEEK MEDICAL CARE IF:  °· You have abdominal pain. °· You have bloating. °· You have cramps. °· You have not gone to the bathroom in 3 days. °SEEK IMMEDIATE MEDICAL CARE IF:  °· Your pain gets worse. °· Your bloating becomes very bad. °· You have a fever or chills, and your symptoms suddenly get worse. °· You begin vomiting. °· You have bowel movements that are bloody or black. °MAKE SURE YOU: °· Understand these instructions. °· Will watch your condition. °· Will get help right away if you are not doing well or get worse. °  °This information is not intended to replace advice given to you by your health care provider. Make sure you discuss any questions you have with your health care provider. °  °Document Released: 07/25/2004 Document Revised: 11/02/2013 Document Reviewed: 09/22/2013 °Elsevier Interactive Patient Education ©2016 Elsevier Inc[employer] °Colon Polyps °Polyps are lumps of extra tissue growing inside the body. Polyps can grow in the large intestine (colon). Most colon polyps are noncancerous (benign). However, some colon polyps can become cancerous over time. Polyps that are larger than a pea may be harmful. To be safe, caregivers remove and test all polyps. °CAUSES  °Polyps form when mutations in the genes cause your cells to grow and divide even though no more tissue is needed. °RISK FACTORS °There are a number  of risk factors that can increase your chances of getting colon polyps. They include: °· Being older than 50 years. °· Family history of colon polyps or colon cancer. °· Long-term colon diseases, such as colitis or Crohn disease. °· Being overweight. °· Smoking. °· Being inactive. °· Drinking too much alcohol. °SYMPTOMS  °Most small polyps do not cause symptoms. If symptoms are present, they may include: °· Blood in the stool. The stool may look dark red or black. °· Constipation or diarrhea that lasts longer than 1 week. °DIAGNOSIS °People often do not know they have polyps until their caregiver finds them during a regular checkup. Your caregiver can use 4 tests to check for polyps: °· Digital rectal exam. The caregiver wears gloves and feels inside the rectum. This test would find polyps only in the rectum. °· Barium enema. The caregiver puts a liquid called barium into your rectum before taking X-rays of your colon. Barium makes your colon look white. Polyps are dark, so they are easy to see in the X-ray pictures. °· Sigmoidoscopy. A thin, flexible tube (sigmoidoscope) is placed into your rectum. The sigmoidoscope has a   light and tiny camera in it. The caregiver uses the sigmoidoscope to look at the last third of your colon. °· Colonoscopy. This test is like sigmoidoscopy, but the caregiver looks at the entire colon. This is the most common method for finding and removing polyps. °TREATMENT  °Any polyps will be removed during a sigmoidoscopy or colonoscopy. The polyps are then tested for cancer. °PREVENTION  °To help lower your risk of getting more colon polyps: °· Eat plenty of fruits and vegetables. Avoid eating fatty foods. °· Do not smoke. °· Avoid drinking alcohol. °· Exercise every day. °· Lose weight if recommended by your caregiver. °· Eat plenty of calcium and folate. Foods that are rich in calcium include milk, cheese, and broccoli. Foods that are rich in folate include chickpeas, kidney beans, and  spinach. °HOME CARE INSTRUCTIONS °Keep all follow-up appointments as directed by your caregiver. You may need periodic exams to check for polyps. °SEEK MEDICAL CARE IF: °You notice bleeding during a bowel movement. °  °This information is not intended to replace advice given to you by your health care provider. Make sure you discuss any questions you have with your health care provider. °  °Document Released: 07/24/2004 Document Revised: 11/18/2014 Document Reviewed: 01/07/2012 °Elsevier Interactive Patient Education ©2016 Elsevier Inc. ° °

## 2015-10-30 NOTE — Op Note (Signed)
[facility] [address] [place], [address]   COLONOSCOPY PROCEDURE REPORT  PATIENT: Stephen Warren, Stephen Warren  MR#: [id number] BIRTHDATE: Mar 05, 1956 , 86  yrs. old GENDER: male ENDOSCOPIST: R.  Garfield Cornea, MD FACP [facility] REFERRED [id number] Legrand Rams, M.D. PROCEDURE DATE:  2015/11/04 PROCEDURE:   Ileo-colonoscopy with snare polypectomy and with biopsy  INDICATIONS:First ever average risk screening colonoscopy. MEDICATIONS: Deep sedation per Dr.  [facility] ASA CLASS:       Class II  CONSENT: The risks, benefits, alternatives and imponderables including but not limited to bleeding, perforation as well as the possibility of a missed lesion have been reviewed.  The potential for biopsy, lesion removal, etc. have also been discussed. Questions have been answered.  All parties agreeable.  Please see the history and physical in the medical record for more information.  DESCRIPTION OF PROCEDURE:   After the risks benefits and alternatives of the procedure were thoroughly explained, informed consent was obtained.  The digital rectal exam revealed no abnormalities of the rectum.   The [id number]  endoscope was introduced through the anus and advanced to the terminal ileum which was intubated for a short distance. No adverse events experienced.   The quality of the prep was adequate  The instrument was then slowly withdrawn as the colon was fully examined. Estimated blood loss is zero unless otherwise noted in this procedure report.      COLON FINDINGS: Prominent internal hemorrhoids; otherwise, normal-appearing rectal mucosa.  Extensive left-sided diverticula Redundant colon.  (1) 1.25 cm angry appearing pedunculated polyp in the proximal sigmoid segment and (2) 5 mm polyps in the distal sigmoid segment; 1 area of ulceration on the distal side of the ileocecal valve.otherwise, the remainder of the colonic mucosa appeared normal except for melanosis  coli.  The distal 10 cm of terminal ileal mucosa appeared normal.  The above-mentioned polyps were hot and cold snare removed, respectively.  Retroflexion was performed. .  Withdrawal time=25 minutes 0 seconds.  The scope was withdrawn and the procedure completed. COMPLICATIONS: EBL 2 mL ENDOSCOPIC IMPRESSION: Internal hemorrhoids. Multiple colonic polyps?"removed as described above. Redundant colon. Colonic diverticulosis. Melanosis coli.      Ulcer on ileocecal valve?"biopsy. Likely NSAID effect.  RECOMMENDATIONS: Follow-up pathology. Further recommendations to follow.  eSigned:  R. Garfield Cornea, MD Rosalita Chessman [facility] 04-Nov-2015 11:20 AM   cc:  CPT CODES: ICD CODES:  The ICD and CPT codes recommended by this software are interpretations from the data that the clinical staff has captured with the software.  The verification of the translation of this report to the ICD and CPT codes and modifiers is the sole responsibility of the health care institution and practicing physician where this report was generated.  [employer] will not be held responsible for the validity of the ICD and CPT codes included on this report.  AMA assumes no liability for data contained or not contained herein. CPT is a Designer, television/film set of the [employer].  PATIENT NAME:  Stephen Warren, Stephen Warren MR#: [id number]

## 2015-11-02 ENCOUNTER — Encounter: Payer: Self-pay | Admitting: Internal Medicine

## 2015-11-08 ENCOUNTER — Encounter (HOSPITAL_COMMUNITY): Payer: Self-pay | Admitting: Internal Medicine

## 2017-01-07 ENCOUNTER — Other Ambulatory Visit (HOSPITAL_COMMUNITY): Payer: Self-pay | Admitting: Internal Medicine

## 2017-01-07 DIAGNOSIS — F17200 Nicotine dependence, unspecified, uncomplicated: Secondary | ICD-10-CM

## 2017-01-16 ENCOUNTER — Ambulatory Visit (HOSPITAL_COMMUNITY)
Admission: RE | Admit: 2017-01-16 | Discharge: 2017-01-16 | Disposition: A | Discharge status: Home / Self Care | Payer: Medicaid Other | Source: Ambulatory Visit | Attending: Internal Medicine | Admitting: Internal Medicine

## 2017-01-16 DIAGNOSIS — F17210 Nicotine dependence, cigarettes, uncomplicated: Secondary | ICD-10-CM | POA: Insufficient documentation

## 2017-01-16 DIAGNOSIS — F17200 Nicotine dependence, unspecified, uncomplicated: Secondary | ICD-10-CM

## 2018-04-01 ENCOUNTER — Other Ambulatory Visit (HOSPITAL_COMMUNITY): Payer: Self-pay | Admitting: Internal Medicine

## 2018-04-01 DIAGNOSIS — R911 Solitary pulmonary nodule: Secondary | ICD-10-CM

## 2018-04-22 ENCOUNTER — Ambulatory Visit (HOSPITAL_COMMUNITY)
Admission: RE | Admit: 2018-04-22 | Discharge: 2018-04-22 | Disposition: A | Discharge status: Home / Self Care | Payer: Medicaid Other | Source: Ambulatory Visit | Attending: Internal Medicine | Admitting: Internal Medicine

## 2018-04-22 DIAGNOSIS — R911 Solitary pulmonary nodule: Secondary | ICD-10-CM | POA: Insufficient documentation

## 2018-04-22 DIAGNOSIS — J439 Emphysema, unspecified: Secondary | ICD-10-CM | POA: Diagnosis not present

## 2018-05-07 ENCOUNTER — Ambulatory Visit (INDEPENDENT_AMBULATORY_CARE_PROVIDER_SITE_OTHER): Payer: Medicaid Other | Admitting: Otolaryngology

## 2018-05-07 DIAGNOSIS — J310 Chronic rhinitis: Secondary | ICD-10-CM

## 2018-05-07 DIAGNOSIS — J342 Deviated nasal septum: Secondary | ICD-10-CM | POA: Diagnosis not present

## 2018-05-07 DIAGNOSIS — J343 Hypertrophy of nasal turbinates: Secondary | ICD-10-CM | POA: Diagnosis not present

## 2018-05-12 ENCOUNTER — Other Ambulatory Visit: Payer: Self-pay | Admitting: Otolaryngology

## 2018-05-20 ENCOUNTER — Telehealth: Payer: Self-pay | Admitting: Acute Care

## 2018-05-20 NOTE — Telephone Encounter (Signed)
This patient is being screened independently of the screening program by Dr. Legrand Rams, [facility]nick in [place],  [place]. While reviewing abnormal scans for the month of June 2019, I did not see telephone notification to the patient of results, or an order placed for  6 month follow up Lung Cancer Screening. I have called the office. I spoke with Morrison. I asked her to make sure the patient is  called with the results, and that an order be placed for 6 month follow up scan per radiology recommendations. I did ask to speak with a nurse, but there was not one available at the time of the call.Doroteo Bradford documented the information, and the need for 6 month follow up. Marland Kitchen She will make sure this information is relayed to the physician and that the patient is called with the results, as well as insuring the order is placed.  This phone call was made as a courtesy as this patient is not followed by the [facility], but is being Independently screened by Dr. Legrand Rams.

## 2018-06-22 NOTE — Pre-Procedure Instructions (Signed)
Stephen Warren  06/22/2018      [name] - [place], [place], [place] [address] [name] [address] Phone: [phone] Fax: [phone]    Your procedure is scheduled on July 01, 2018.  Report to [facility] Admitting at 630 AM.  Call this number if you have problems the morning of surgery:  [phone]   Remember:  Do not eat or drink after midnight.    Take these medicines the morning of surgery with A SIP OF WATER  Baclofen (lioresal) citalpram (celexa) flonase nasal Spray PROAIR inhaler-if needed (bring inhaler with you) Symbicort inhaler Spiriva inhaler  7 days prior to surgery STOP taking any Aspirin (unless otherwise instructed by your surgeon), Aleve, Naproxen, Ibuprofen, Motrin, Advil, Goody's, BC's, all herbal medications, fish oil, and all vitamins   Do not wear jewelry  Do not wear lotions, powders, or colognes, or deodorant.  Men may shave face and neck.  Do not bring valuables to the hospital.  [facility] is not responsible for any belongings or valuables.  Contacts, dentures or bridgework may not be worn into surgery.  Leave your suitcase in the car.  After surgery it may be brought to your room.  For patients admitted to the hospital, discharge time will be determined by your treatment team.  Patients discharged the day of surgery will not be allowed to drive home.   Stephen Warren- Preparing For Surgery  Before surgery, you can play an important role. Because skin is not sterile, your skin needs to be as free of germs as possible. You can reduce the number of germs on your skin by washing with CHG (chlorahexidine gluconate) Soap before surgery.  CHG is an antiseptic cleaner which kills germs and bonds with the skin to continue killing germs even after washing.    Oral Hygiene is also important to reduce your risk of infection.  Remember - BRUSH YOUR TEETH THE MORNING OF SURGERY WITH YOUR REGULAR  TOOTHPASTE  Please do not use if you have an allergy to CHG or antibacterial soaps. If your skin becomes reddened/irritated stop using the CHG.  Do not shave (including legs and underarms) for at least 48 hours prior to first CHG shower. It is OK to shave your face.  Please follow these instructions carefully.   1. Shower the NIGHT BEFORE SURGERY and the MORNING OF SURGERY with CHG.   2. If you chose to wash your hair, wash your hair first as usual with your normal shampoo.  3. After you shampoo, rinse your hair and body thoroughly to remove the shampoo.  4. Use CHG as you would any other liquid soap. You can apply CHG directly to the skin and wash gently with a scrungie or a clean washcloth.   5. Apply the CHG Soap to your body ONLY FROM THE NECK DOWN.  Do not use on open wounds or open sores. Avoid contact with your eyes, ears, mouth and genitals (private parts). Wash Face and genitals (private parts)  with your normal soap.  6. Wash thoroughly, paying special attention to the area where your surgery will be performed.  7. Thoroughly rinse your body with warm water from the neck down.  8. DO NOT shower/wash with your normal soap after using and rinsing off the CHG Soap.  9. Pat yourself dry with a CLEAN TOWEL.  10. Wear CLEAN PAJAMAS to bed the night before surgery, wear comfortable clothes the  morning of surgery  11. Place CLEAN SHEETS on your bed the night of your first shower and DO NOT SLEEP WITH PETS.  Day of Surgery:  Do not apply any deodorants/lotions.  Please wear clean clothes to the hospital/surgery center.   Remember to brush your teeth WITH YOUR REGULAR TOOTHPASTE.  Please read over the following fact sheets that you were given.

## 2018-06-23 ENCOUNTER — Encounter (HOSPITAL_COMMUNITY)
Admission: RE | Admit: 2018-06-23 | Discharge: 2018-06-23 | Disposition: A | Discharge status: Home / Self Care | Payer: Medicaid Other | Source: Ambulatory Visit | Attending: Otolaryngology | Admitting: Otolaryngology

## 2018-06-23 ENCOUNTER — Other Ambulatory Visit: Payer: Self-pay

## 2018-06-23 ENCOUNTER — Encounter (HOSPITAL_COMMUNITY): Payer: Self-pay

## 2018-06-23 DIAGNOSIS — Z01812 Encounter for preprocedural laboratory examination: Secondary | ICD-10-CM | POA: Insufficient documentation

## 2018-06-23 HISTORY — DX: Unspecified asthma, uncomplicated: J45.909

## 2018-06-23 LAB — CBC
HEMATOCRIT: 40.9 % (ref 39.0–52.0)
Hemoglobin: 13.1 g/dL (ref 13.0–17.0)
MCH: 30.7 pg (ref 26.0–34.0)
MCHC: 32 g/dL (ref 30.0–36.0)
MCV: 95.8 fL (ref 78.0–100.0)
PLATELETS: 218 10*3/uL (ref 150–400)
RBC: 4.27 MIL/uL (ref 4.22–5.81)
RDW: 12.1 % (ref 11.5–15.5)
WBC: 10.9 10*3/uL — AB (ref 4.0–10.5)

## 2018-06-23 NOTE — Progress Notes (Signed)
PCP - [name], MD Cardiologist -denies   Chest x-ray - N/A EKG -N/A Stress Test -denies  ECHO - denies Cardiac Cath - denies  Sleep Study - denies  Aspirin Instructions: N/A  Anesthesia review: No  Pt wears 1.5L of oxygen continuously. Pt is a former smoker, quit when he had to be placed on oxygen. SpO2 96%. RR 22.    Patient denies shortness of breath, fever, cough and chest pain at PAT appointment   Patient verbalized understanding of instructions that were given to them at the PAT appointment. Patient was also instructed that they will need to review over the PAT instructions again at home before surgery.

## 2018-06-30 NOTE — Anesthesia Preprocedure Evaluation (Addendum)
Anesthesia Evaluation  Patient identified by MRN, date of birth, ID band Patient awake    Reviewed: Allergy & Precautions, NPO status , Patient's Chart, lab work & pertinent test results  History of Anesthesia Complications Negative for: history of anesthetic complications  Airway Mallampati: II  TM Distance: >3 FB Neck ROM: Full    Dental  (+) Edentulous Upper, Edentulous Lower, Dental Advisory Given   Pulmonary asthma , COPD,  COPD inhaler and oxygen dependent, former smoker,    breath sounds clear to auscultation       Cardiovascular (-) anginanegative cardio ROS   Rhythm:Regular Rate:Normal     Neuro/Psych Anxiety Depression negative neurological ROS     GI/Hepatic negative GI ROS, Neg liver ROS,   Endo/Other  Morbid obesity  Renal/GU negative Renal ROS  negative genitourinary   Musculoskeletal  (+) Arthritis ,   Abdominal (+) + obese,   Peds  (+) ATTENTION DEFICIT DISORDER WITHOUT HYPERACTIVITY Hematology negative hematology ROS (+)   Anesthesia Other Findings   Reproductive/Obstetrics                           Anesthesia Physical Anesthesia Plan  ASA: II  Anesthesia Plan: General   Post-op Pain Management:    Induction: Intravenous  PONV Risk Score and Plan: 3 and Treatment may vary due to age or medical condition, Ondansetron, Dexamethasone and Midazolam  Airway Management Planned: Oral ETT  Additional Equipment: None  Intra-op Plan:   Post-operative Plan: Extubation in OR  Informed Consent: I have reviewed the patients History and Physical, chart, labs and discussed the procedure including the risks, benefits and alternatives for the proposed anesthesia with the patient or authorized representative who has indicated his/her understanding and acceptance.   Dental advisory given  Plan Discussed with: CRNA and Anesthesiologist  Anesthesia Plan Comments:         Anesthesia Quick Evaluation

## 2018-07-01 ENCOUNTER — Other Ambulatory Visit: Payer: Self-pay

## 2018-07-01 ENCOUNTER — Ambulatory Visit (HOSPITAL_COMMUNITY): Payer: Medicaid Other | Admitting: Anesthesiology

## 2018-07-01 ENCOUNTER — Encounter (HOSPITAL_COMMUNITY)
Admission: RE | Disposition: A | Discharge status: Home / Self Care | Payer: Self-pay | Source: Ambulatory Visit | Attending: Otolaryngology

## 2018-07-01 ENCOUNTER — Ambulatory Visit (HOSPITAL_COMMUNITY)
Admission: RE | Admit: 2018-07-01 | Discharge: 2018-07-02 | Disposition: A | Discharge status: Home / Self Care | Payer: Medicaid Other | Source: Ambulatory Visit | Attending: Otolaryngology | Admitting: Otolaryngology

## 2018-07-01 ENCOUNTER — Encounter (HOSPITAL_COMMUNITY): Payer: Self-pay | Admitting: Certified Registered Nurse Anesthetist

## 2018-07-01 DIAGNOSIS — J3489 Other specified disorders of nose and nasal sinuses: Secondary | ICD-10-CM | POA: Diagnosis not present

## 2018-07-01 DIAGNOSIS — J343 Hypertrophy of nasal turbinates: Secondary | ICD-10-CM | POA: Diagnosis not present

## 2018-07-01 DIAGNOSIS — Z6841 Body Mass Index (BMI) 40.0 and over, adult: Secondary | ICD-10-CM | POA: Diagnosis not present

## 2018-07-01 DIAGNOSIS — Z9981 Dependence on supplemental oxygen: Secondary | ICD-10-CM | POA: Insufficient documentation

## 2018-07-01 DIAGNOSIS — J342 Deviated nasal septum: Secondary | ICD-10-CM | POA: Insufficient documentation

## 2018-07-01 DIAGNOSIS — Z7951 Long term (current) use of inhaled steroids: Secondary | ICD-10-CM | POA: Diagnosis not present

## 2018-07-01 DIAGNOSIS — F419 Anxiety disorder, unspecified: Secondary | ICD-10-CM | POA: Diagnosis not present

## 2018-07-01 DIAGNOSIS — J439 Emphysema, unspecified: Secondary | ICD-10-CM | POA: Diagnosis not present

## 2018-07-01 DIAGNOSIS — Z87891 Personal history of nicotine dependence: Secondary | ICD-10-CM | POA: Diagnosis not present

## 2018-07-01 DIAGNOSIS — F329 Major depressive disorder, single episode, unspecified: Secondary | ICD-10-CM | POA: Diagnosis not present

## 2018-07-01 DIAGNOSIS — Z9889 Other specified postprocedural states: Secondary | ICD-10-CM

## 2018-07-01 HISTORY — PX: NASAL SEPTOPLASTY W/ TURBINOPLASTY: SHX2070

## 2018-07-01 SURGERY — SEPTOPLASTY, NOSE, WITH NASAL TURBINATE REDUCTION
Anesthesia: General | Site: Nose | Laterality: Bilateral

## 2018-07-01 MED ORDER — ACETAMINOPHEN 10 MG/ML IV SOLN
INTRAVENOUS | Status: AC
  Filled 2018-07-01: qty 100

## 2018-07-01 MED ORDER — ONDANSETRON HCL 4 MG/2ML IJ SOLN
INTRAMUSCULAR | Status: DC | PRN
  Administered 2018-07-01: 4 mg via INTRAVENOUS

## 2018-07-01 MED ORDER — SODIUM CHLORIDE 0.9 % IV SOLN
INTRAVENOUS | Status: DC | PRN
  Administered 2018-07-01: 20 ug/min via INTRAVENOUS

## 2018-07-01 MED ORDER — LIDOCAINE-EPINEPHRINE 1 %-1:100000 IJ SOLN
INTRAMUSCULAR | Status: AC
  Filled 2018-07-01: qty 1

## 2018-07-01 MED ORDER — AMOXICILLIN 875 MG PO TABS: 875.0000 mg | ORAL_TABLET | Freq: Two times a day (BID) | ORAL | 0 refills | Status: AC

## 2018-07-01 MED ORDER — PNEUMOCOCCAL VAC POLYVALENT 25 MCG/0.5ML IJ INJ
0.5000 mL | INJECTION | INTRAMUSCULAR | Status: DC
  Filled 2018-07-01: qty 0.5

## 2018-07-01 MED ORDER — BACITRACIN ZINC 500 UNIT/GM EX OINT
TOPICAL_OINTMENT | CUTANEOUS | Status: DC | PRN
  Administered 2018-07-01: 1 via TOPICAL

## 2018-07-01 MED ORDER — BACITRACIN ZINC 500 UNIT/GM EX OINT
TOPICAL_OINTMENT | CUTANEOUS | Status: AC
  Filled 2018-07-01: qty 28.35

## 2018-07-01 MED ORDER — MIDAZOLAM HCL 5 MG/5ML IJ SOLN
INTRAMUSCULAR | Status: DC | PRN
  Administered 2018-07-01: 2 mg via INTRAVENOUS

## 2018-07-01 MED ORDER — BACLOFEN 20 MG PO TABS
20.0000 mg | ORAL_TABLET | Freq: Three times a day (TID) | ORAL | Status: DC
  Administered 2018-07-01 (×2): 20 mg via ORAL
  Filled 2018-07-01 (×3): qty 1

## 2018-07-01 MED ORDER — EPHEDRINE SULFATE-NACL 50-0.9 MG/10ML-% IV SOSY
PREFILLED_SYRINGE | INTRAVENOUS | Status: DC | PRN
  Administered 2018-07-01: 10 mg via INTRAVENOUS
  Administered 2018-07-01: 5 mg via INTRAVENOUS

## 2018-07-01 MED ORDER — SUGAMMADEX SODIUM 500 MG/5ML IV SOLN
INTRAVENOUS | Status: AC
  Filled 2018-07-01: qty 5

## 2018-07-01 MED ORDER — ROCURONIUM BROMIDE 10 MG/ML (PF) SYRINGE
PREFILLED_SYRINGE | INTRAVENOUS | Status: DC | PRN
  Administered 2018-07-01: 10 mg via INTRAVENOUS
  Administered 2018-07-01: 40 mg via INTRAVENOUS

## 2018-07-01 MED ORDER — OXYCODONE-ACETAMINOPHEN 5-325 MG PO TABS: 1.0000 | ORAL_TABLET | ORAL | 0 refills | Status: DC | PRN

## 2018-07-01 MED ORDER — LACTATED RINGERS IV SOLN
INTRAVENOUS | Status: DC | PRN
  Administered 2018-07-01: 08:00:00 via INTRAVENOUS

## 2018-07-01 MED ORDER — DEXAMETHASONE SODIUM PHOSPHATE 10 MG/ML IJ SOLN
INTRAMUSCULAR | Status: AC
  Filled 2018-07-01: qty 1

## 2018-07-01 MED ORDER — LIDOCAINE-EPINEPHRINE 1 %-1:100000 IJ SOLN
INTRAMUSCULAR | Status: DC | PRN
  Administered 2018-07-01: 2 mL

## 2018-07-01 MED ORDER — PROMETHAZINE HCL 25 MG/ML IJ SOLN: 6.2500 mg | INTRAMUSCULAR | Status: DC | PRN

## 2018-07-01 MED ORDER — FENTANYL CITRATE (PF) 100 MCG/2ML IJ SOLN: 25.0000 ug | INTRAMUSCULAR | Status: DC | PRN

## 2018-07-01 MED ORDER — MORPHINE SULFATE (PF) 2 MG/ML IV SOLN: 2.0000 mg | INTRAVENOUS | Status: DC | PRN

## 2018-07-01 MED ORDER — OXYCODONE HCL 5 MG PO TABS: 5.0000 mg | ORAL_TABLET | Freq: Once | ORAL | Status: DC | PRN

## 2018-07-01 MED ORDER — LIDOCAINE 2% (20 MG/ML) 5 ML SYRINGE
INTRAMUSCULAR | Status: AC
  Filled 2018-07-01: qty 5

## 2018-07-01 MED ORDER — DEXAMETHASONE SODIUM PHOSPHATE 10 MG/ML IJ SOLN
INTRAMUSCULAR | Status: DC | PRN
  Administered 2018-07-01: 10 mg via INTRAVENOUS

## 2018-07-01 MED ORDER — EPHEDRINE 5 MG/ML INJ
INTRAVENOUS | Status: AC
  Filled 2018-07-01: qty 10

## 2018-07-01 MED ORDER — 0.9 % SODIUM CHLORIDE (POUR BTL) OPTIME
TOPICAL | Status: DC | PRN
  Administered 2018-07-01: 1000 mL

## 2018-07-01 MED ORDER — TRAZODONE HCL 100 MG PO TABS
200.0000 mg | ORAL_TABLET | Freq: Every day | ORAL | Status: DC
  Administered 2018-07-01: 200 mg via ORAL
  Filled 2018-07-01: qty 2

## 2018-07-01 MED ORDER — FENTANYL CITRATE (PF) 250 MCG/5ML IJ SOLN
INTRAMUSCULAR | Status: AC
  Filled 2018-07-01: qty 5

## 2018-07-01 MED ORDER — FENTANYL CITRATE (PF) 250 MCG/5ML IJ SOLN
INTRAMUSCULAR | Status: DC | PRN
  Administered 2018-07-01 (×2): 50 ug via INTRAVENOUS

## 2018-07-01 MED ORDER — DEXTROSE 5 % IV SOLN
INTRAVENOUS | Status: DC | PRN
  Administered 2018-07-01: 3 g via INTRAVENOUS

## 2018-07-01 MED ORDER — TIOTROPIUM BROMIDE MONOHYDRATE 18 MCG IN CAPS
18.0000 ug | ORAL_CAPSULE | Freq: Every day | RESPIRATORY_TRACT | Status: DC
  Administered 2018-07-02: 18 ug via RESPIRATORY_TRACT
  Filled 2018-07-01: qty 5

## 2018-07-01 MED ORDER — ONDANSETRON HCL 4 MG/2ML IJ SOLN
INTRAMUSCULAR | Status: AC
  Filled 2018-07-01: qty 2

## 2018-07-01 MED ORDER — ALBUTEROL SULFATE (2.5 MG/3ML) 0.083% IN NEBU: 3.0000 mL | INHALATION_SOLUTION | Freq: Four times a day (QID) | RESPIRATORY_TRACT | Status: DC | PRN

## 2018-07-01 MED ORDER — CITALOPRAM HYDROBROMIDE 20 MG PO TABS
20.0000 mg | ORAL_TABLET | Freq: Every day | ORAL | Status: DC
  Filled 2018-07-01: qty 1

## 2018-07-01 MED ORDER — LIDOCAINE 2% (20 MG/ML) 5 ML SYRINGE
INTRAMUSCULAR | Status: DC | PRN
  Administered 2018-07-01: 100 mg via INTRAVENOUS

## 2018-07-01 MED ORDER — ACETAMINOPHEN 10 MG/ML IV SOLN
INTRAVENOUS | Status: DC | PRN
  Administered 2018-07-01: 1000 mg via INTRAVENOUS

## 2018-07-01 MED ORDER — ONDANSETRON HCL 4 MG/2ML IJ SOLN: 4.0000 mg | INTRAMUSCULAR | Status: DC | PRN

## 2018-07-01 MED ORDER — MIDAZOLAM HCL 2 MG/2ML IJ SOLN
INTRAMUSCULAR | Status: AC
  Filled 2018-07-01: qty 2

## 2018-07-01 MED ORDER — OXYCODONE HCL 5 MG/5ML PO SOLN: 5.0000 mg | Freq: Once | ORAL | Status: DC | PRN

## 2018-07-01 MED ORDER — PROPOFOL 10 MG/ML IV BOLUS
INTRAVENOUS | Status: DC | PRN
  Administered 2018-07-01: 160 mg via INTRAVENOUS

## 2018-07-01 MED ORDER — ONDANSETRON HCL 4 MG PO TABS: 4.0000 mg | ORAL_TABLET | ORAL | Status: DC | PRN

## 2018-07-01 MED ORDER — SUGAMMADEX SODIUM 500 MG/5ML IV SOLN
INTRAVENOUS | Status: DC | PRN
  Administered 2018-07-01: 300 mg via INTRAVENOUS

## 2018-07-01 MED ORDER — OXYCODONE-ACETAMINOPHEN 5-325 MG PO TABS: 1.0000 | ORAL_TABLET | ORAL | Status: DC | PRN

## 2018-07-01 MED ORDER — OXYMETAZOLINE HCL 0.05 % NA SOLN
NASAL | Status: AC
  Filled 2018-07-01: qty 15

## 2018-07-01 MED ORDER — MOMETASONE FURO-FORMOTEROL FUM 200-5 MCG/ACT IN AERO
2.0000 | INHALATION_SPRAY | Freq: Two times a day (BID) | RESPIRATORY_TRACT | Status: DC
  Administered 2018-07-01 – 2018-07-02 (×2): 2 via RESPIRATORY_TRACT
  Filled 2018-07-01: qty 8.8

## 2018-07-01 MED ORDER — PROPOFOL 10 MG/ML IV BOLUS
INTRAVENOUS | Status: AC
  Filled 2018-07-01: qty 20

## 2018-07-01 MED ORDER — OXYMETAZOLINE HCL 0.05 % NA SOLN
NASAL | Status: DC | PRN
  Administered 2018-07-01: 1

## 2018-07-01 MED ORDER — KCL IN DEXTROSE-NACL 20-5-0.45 MEQ/L-%-% IV SOLN
INTRAVENOUS | Status: DC
  Administered 2018-07-01: 12:00:00 via INTRAVENOUS
  Filled 2018-07-01: qty 1000

## 2018-07-01 MED ORDER — ROCURONIUM BROMIDE 50 MG/5ML IV SOSY
PREFILLED_SYRINGE | INTRAVENOUS | Status: AC
  Filled 2018-07-01: qty 5

## 2018-07-01 MED ORDER — CEFAZOLIN SODIUM 1 G IJ SOLR
INTRAMUSCULAR | Status: AC
  Filled 2018-07-01: qty 30

## 2018-07-01 SURGICAL SUPPLY — 27 items
CANISTER SUCT 3000ML PPV (MISCELLANEOUS) ×3 IMPLANT
COAGULATOR SUCT 6 FR SWTCH (ELECTROSURGICAL) ×1
COAGULATOR SUCT SWTCH 10FR 6 (ELECTROSURGICAL) ×2 IMPLANT
COVER SURGICAL LIGHT HANDLE (MISCELLANEOUS) IMPLANT
DRAPE HALF SHEET 40X57 (DRAPES) IMPLANT
ELECT REM PT RETURN 9FT ADLT (ELECTROSURGICAL) ×3
ELECTRODE REM PT RTRN 9FT ADLT (ELECTROSURGICAL) ×1 IMPLANT
GAUZE SPONGE 2X2 8PLY STRL LF (GAUZE/BANDAGES/DRESSINGS) ×1 IMPLANT
GLOVE ECLIPSE 7.5 STRL STRAW (GLOVE) ×3 IMPLANT
GOWN STRL REUS W/ TWL LRG LVL3 (GOWN DISPOSABLE) ×2 IMPLANT
GOWN STRL REUS W/TWL LRG LVL3 (GOWN DISPOSABLE) ×4
KIT BASIN OR (CUSTOM PROCEDURE TRAY) ×3 IMPLANT
KIT TURNOVER KIT B (KITS) ×3 IMPLANT
NEEDLE HYPO 25GX1X1/2 BEV (NEEDLE) ×3 IMPLANT
NS IRRIG 1000ML POUR BTL (IV SOLUTION) ×3 IMPLANT
PAD ARMBOARD 7.5X6 YLW CONV (MISCELLANEOUS) ×6 IMPLANT
SPLINT NASAL DOYLE BI-VL (GAUZE/BANDAGES/DRESSINGS) ×3 IMPLANT
SPONGE GAUZE 2X2 STER 10/PKG (GAUZE/BANDAGES/DRESSINGS) ×2
SPONGE NEURO XRAY DETECT 1X3 (DISPOSABLE) ×3 IMPLANT
SUT CHROMIC 3 0 SH 27 (SUTURE) ×3 IMPLANT
SUT CHROMIC 4 0 SH 27 (SUTURE) ×3 IMPLANT
SUT PLAIN 4 0 ~~LOC~~ 1 (SUTURE) ×3 IMPLANT
SUT PROLENE 2 0 FS (SUTURE) ×3 IMPLANT
SUT PROLENE 3 0 PS 2 (SUTURE) ×3 IMPLANT
TRAY ENT MC OR (CUSTOM PROCEDURE TRAY) ×3 IMPLANT
TUBE SALEM SUMP 16 FR W/ARV (TUBING) IMPLANT
TUBING EXTENTION W/L.L. (IV SETS) IMPLANT

## 2018-07-01 NOTE — Plan of Care (Signed)
  Problem: Education: Goal: Knowledge of General Education information will improve Description Including pain rating scale, medication(s)/side effects and non-pharmacologic comfort measures 07/01/2018 1734 by Candida Peeling, RN Outcome: Progressing 07/01/2018 1150 by Candida Peeling, RN Outcome: Progressing   Problem: Health Behavior/Discharge Planning: Goal: Ability to manage health-related needs will improve 07/01/2018 1734 by Candida Peeling, RN Outcome: Progressing 07/01/2018 1150 by Candida Peeling, RN Outcome: Progressing   Problem: Clinical Measurements: Goal: Ability to maintain clinical measurements within normal limits will improve 07/01/2018 1734 by Candida Peeling, RN Outcome: Progressing 07/01/2018 1150 by Candida Peeling, RN Outcome: Progressing

## 2018-07-01 NOTE — Transfer of Care (Signed)
Immediate Anesthesia Transfer of Care Note  Patient: Stephen Warren  Procedure(s) Performed: NASAL SEPTOPLASTY WITH BILATERAL TURBINATE REDUCTION (Bilateral Nose)  Patient Location: PACU  Anesthesia Type:General  Level of Consciousness: awake, alert  and oriented  Airway & Oxygen Therapy: Patient Spontanous Breathing and Patient connected to face mask oxygen  Post-op Assessment: Report given to RN, Post -op Vital signs reviewed and stable and Patient moving all extremities X 4  Post vital signs: Reviewed and stable  Last Vitals:  Vitals Value Taken Time  BP    Temp    Pulse 81 07/01/2018  9:54 AM  Resp 22 07/01/2018  9:54 AM  SpO2 100 % 07/01/2018  9:54 AM  Vitals shown include unvalidated device data.  Last Pain:  Vitals:   07/01/18 0727  TempSrc:   PainSc: 0-No pain      Patients Stated Pain Goal: 2 (84/78/41 2820)  Complications: No apparent anesthesia complications

## 2018-07-01 NOTE — Plan of Care (Signed)

## 2018-07-01 NOTE — Discharge Instructions (Signed)

## 2018-07-01 NOTE — Progress Notes (Signed)
Patient states that his transportation home is with [facility] and that he does not have anyone to stay with him until this evening.  Dr. Benjamine Mola aware and states that he will have the patient stay overnight.

## 2018-07-01 NOTE — H&P (Signed)
Cc: Chronic nasal obstruction  HPI: The patient is a 62 year old male who presents today complaining of chronic nasal obstruction for 3+ years.  He was previously treated with Flonase nasal spray without improvement in his symptoms.  The patient has a history of COPD.  He has been smoking cigarettes most of his life.  He finally quit in 12/2017.  The patient is currently on oxygen due to his COPD.  The patient currently denies any facial pain, fever, nasal drainage or visual change.  He has a history of multiple nasal traumas when he was younger.  He denies any history of sinonasal surgery.   The patient's review of systems (constitutional, eyes, ENT, cardiovascular, respiratory, GI, musculoskeletal, skin, neurologic, psychiatric, endocrine, hematologic, allergic) is noted in the ROS questionnaire.  It is reviewed with the patient.  Family health history: None.   Major events: Polypectomy.   Ongoing medical problems: Asthma, emphysema, COPD, chronic bronchitis, depression, anxiety, allergies, hay fever, home oxygen use, ADD, panic attacks.   Social history: The patient is single. He is a former smoker. He denies the use of alcohol or illegal drugs.    Exam:  General: Communicates without difficulty, well nourished, no acute distress. Head: Normocephalic, no evidence injury, no tenderness, facial buttresses intact without stepoff. Face/sinus: No tenderness to palpation and percussion. Facial movement is normal and symmetric. Eyes: PERRL, EOMI. No scleral icterus, conjunctivae clear. Neuro: CN II exam reveals vision grossly intact.  No nystagmus at any point of gaze. Ears: Auricles well formed without lesions.  Ear canals are intact without mass or lesion.  No erythema or edema is appreciated.  The TMs are intact without fluid. Nose: External evaluation reveals normal support and skin without lesions.  Dorsum is intact.  Anterior rhinoscopy reveals congested mucosa over anterior aspect of inferior  turbinates and deviated septum.  No purulence noted. Oral:  Oral cavity and oropharynx are intact, symmetric, without erythema or edema.  Mucosa is moist without lesions. Neck: Full range of motion without pain.  There is no significant lymphadenopathy.  No masses palpable.  Thyroid bed within normal limits to palpation.  Parotid glands and submandibular glands equal bilaterally without mass.  Trachea is midline. Neuro:  CN 2-12 otherwise grossly intact.   Procedure:  Flexible Nasal Endoscopy: Risks, benefits, and alternatives of flexible endoscopy were explained to the patient.  Specific mention was made of the risk of throat numbness with difficulty swallowing, possible bleeding from the nose and mouth, and pain from the procedure.  The patient gave oral consent to proceed.  The nasal cavities were decongested and anesthetised with a combination of oxymetazoline and 4% lidocaine solution.  The flexible scope was inserted into the right nasal cavity.  Endoscopy of the inferior and middle meatus was performed.  The edematous mucosa was as described above.  NSD noted. No polyp, mass, or lesion was appreciated.  Olfactory cleft was clear.  Nasopharynx was clear.  Turbinates were hypertrophied but without mass.  Incomplete response to decongestion.  The procedure was repeated on the contralateral side with similar findings.  The patient tolerated the procedure well.  Instructions were given to avoid eating or drinking for 2 hours.   Assessment  1.  Chronic nasal obstruction, secondary to nasal mucosal congestion, nasal septal deviation and bilateral inferior turbinate hypertrophy.  No polyps, mass, or lesion is noted on today's nasal endoscopy evaluation.  2.  History of COPD, currently on oxygen by nasal cannula.   3.  The patient has not  responded to medical treatment with Flonase nasal spray.    Plan: 1.  The physical exam and nasal endoscopy findings are reviewed with the patient.  2.  The patient should  continue with the Flonase nasal spray.   3.  Nasal saline irrigation prn.  4.  The option of septoplasty and turbinate reduction to improve his nasal passageways is extensively discussed.  The risks, benefits, and details of the procedures are reviewed with the patient.  Questions are invited and answered.  5.  The patient would like to proceed with the procedures.

## 2018-07-01 NOTE — Op Note (Signed)
DATE OF PROCEDURE: 07/01/2018  OPERATIVE REPORT   SURGEON: Leta Baptist, MD   PREOPERATIVE DIAGNOSES:  1. Severe nasal septal deviation.  2. Bilateral inferior turbinate hypertrophy.  3. Chronic nasal obstruction.  POSTOPERATIVE DIAGNOSES:  1. Severe nasal septal deviation.  2. Bilateral inferior turbinate hypertrophy.  3. Chronic nasal obstruction.  PROCEDURE PERFORMED:  1. Septoplasty.  2. Bilateral partial inferior turbinate resection.   ANESTHESIA: General endotracheal tube anesthesia.   COMPLICATIONS: None.   ESTIMATED BLOOD LOSS: 150 mL.   INDICATION FOR PROCEDURE: Stephen Warren is a 62 y.o. male with a history of chronic nasal obstruction. The patient was treated with OTC allergy medications and steroid nasal spray. However, the patient continued to be symptomatic. On examination, the patient was noted to have bilateral inferior turbinate hypertrophy and significant nasal septal deviation, causing significant nasal obstruction. Based on the above findings, the decision was made for the patient to undergo the above-stated procedures. The risks, benefits, alternatives, and details of the procedures were discussed with the patient. Questions were invited and answered. Informed consent was obtained.   DESCRIPTION OF PROCEDURE: The patient was taken to the operating room and placed supine on the operating table. General endotracheal tube anesthesia was administered by the anesthesiologist. The patient was positioned, and prepped and draped in the standard fashion for nasal surgery. Pledgets soaked with Afrin were placed in both nasal cavities for decongestion. The pledgets were subsequently removed. The above mentioned severe septal deviation was again noted. 1% lidocaine with 1:100,000 epinephrine was injected onto the nasal septum bilaterally. A hemitransfixion incision was made on the left side. The mucosal flap was carefully elevated on the left side. A cartilaginous incision was made 1  cm superior to the caudal margin of the nasal septum. Mucosal flap was also elevated on the right side in the similar fashion. It should be noted that due to the severe septal deviation, the deviated portion of the cartilaginous and bony septum had to be removed in piecemeal fashion. Once the deviated portions were removed, a straight midline septum was achieved. The septum was then quilted with 4-0 plain gut sutures. The hemitransfixion incision was closed with interrupted 4-0 chromic sutures. Doyle splints were applied.   Prior to the [facility] splint application, the inferior one half of both hypertrophied inferior turbinate was crossclamped with a Kelly clamp. The inferior one half of each inferior turbinate was then resected with a pair of cross cutting scissors. Hemostasis was achieved with a suction cautery device.   The care of the patient was turned over to the anesthesiologist. The patient was awakened from anesthesia without difficulty. The patient was extubated and transferred to the recovery room in good condition.   OPERATIVE FINDINGS: Severe nasal septal deviation and bilateral inferior turbinate hypertrophy.   SPECIMEN: None.   FOLLOWUP CARE: The patient be observed overnight The patient will be placed on Percocet 1 tablets p.o. q.4 hours p.r.n. pain, and amoxicillin 875 mg p.o. b.i.d. for 5 days. The patient will follow up in my office in approximately 1 week for splint removal.   Kathey Simer Raynelle Bring, MD

## 2018-07-01 NOTE — Anesthesia Postprocedure Evaluation (Signed)
Anesthesia Post Note  Patient: Stephen Warren  Procedure(s) Performed: NASAL SEPTOPLASTY WITH BILATERAL TURBINATE REDUCTION (Bilateral Nose)     Patient location during evaluation: PACU Anesthesia Type: General Level of consciousness: awake and alert Pain management: pain level controlled Vital Signs Assessment: post-procedure vital signs reviewed and stable Respiratory status: spontaneous breathing, nonlabored ventilation and respiratory function stable Cardiovascular status: blood pressure returned to baseline and stable Postop Assessment: no apparent nausea or vomiting Anesthetic complications: no    Last Vitals:  Vitals:   07/01/18 1039 07/01/18 1100  BP: 118/75   Pulse: 75   Resp: 15   Temp:  (!) 36.3 C  SpO2: 100%     Last Pain:  Vitals:   07/01/18 1030  TempSrc:   PainSc: 0-No pain                 Audry Pili

## 2018-07-01 NOTE — Anesthesia Procedure Notes (Signed)
Procedure Name: Intubation Date/Time: 07/01/2018 8:44 AM Performed by: Harden Mo, CRNA Pre-anesthesia Checklist: Patient identified, Emergency Drugs available, Suction available and Patient being monitored Patient Re-evaluated:Patient Re-evaluated prior to induction Oxygen Delivery Method: Circle System Utilized Preoxygenation: Pre-oxygenation with 100% oxygen Induction Type: IV induction Ventilation: Mask ventilation without difficulty and Oral airway inserted - appropriate to patient size Laryngoscope Size: Sabra Heck and 2 Grade View: Grade I Tube type: Oral Tube size: 7.5 mm Number of attempts: 1 Airway Equipment and Method: Stylet and Oral airway Placement Confirmation: ETT inserted through vocal cords under direct vision,  positive ETCO2 and breath sounds checked- equal and bilateral Secured at: 22 cm Tube secured with: Tape Dental Injury: Teeth and Oropharynx as per pre-operative assessment

## 2018-07-02 ENCOUNTER — Encounter (HOSPITAL_COMMUNITY): Payer: Self-pay | Admitting: Otolaryngology

## 2018-07-02 DIAGNOSIS — J3489 Other specified disorders of nose and nasal sinuses: Secondary | ICD-10-CM | POA: Diagnosis not present

## 2018-07-02 NOTE — Progress Notes (Signed)
Patient discharged home via [facility] transport. Discharged instructions,personal belongings given to patient. Verbalized understanding of instructions

## 2018-07-02 NOTE — Discharge Summary (Signed)
Physician Discharge Summary  Patient ID: Stephen Warren MRN: [id number] DOB/AGE: 1956-08-19 62 y.o.  Admit date: 07/01/2018 Discharge date: 07/02/2018  Admission Diagnoses: Chronic nasal obstruction  Discharge Diagnoses: Chronic nasal obstruction Active Problems:   S/P nasal septoplasty   Discharged Condition: good  Hospital Course: Pt had an uneventful overnight stay. Pt tolerated po well. No breathing difficulty.  Consults: None  Significant Diagnostic Studies: None  Treatments: surgery: Septoplasty and turbinate reduction  Discharge Exam: Blood pressure 135/80, pulse 84, temperature 97.7 F (36.5 C), temperature source Oral, resp. rate 16, height 6\' 1"  (1.854 m), weight (!) 145.2 kg, SpO2 94 %.    Disposition: Discharge disposition: 01-Home or Self Care       Discharge Instructions    Activity as tolerated - No restrictions   Complete by:  As directed    Diet general   Complete by:  As directed      Allergies as of 07/02/2018      Reactions   Other Anaphylaxis   C[employer]pillar    Iodine    Not sure of reaction   Shellfish Allergy Swelling      Medication List    STOP taking these medications   fluticasone 50 MCG/ACT nasal spray Commonly known as:  FLONASE   naproxen 500 MG tablet Commonly known as:  NAPROSYN     TAKE these medications   amoxicillin 875 MG tablet Commonly known as:  AMOXIL Take 1 tablet (875 mg total) by mouth 2 (two) times daily for 5 days.   baclofen 20 MG tablet Commonly known as:  LIORESAL Take 20 mg by mouth 3 (three) times daily.   citalopram 20 MG tablet Commonly known as:  CELEXA Take 20 mg by mouth daily.   hydrocortisone cream 1 % Apply 1 application topically 2 (two) times daily as needed for itching.   multivitamin with minerals Tabs tablet Take 1 tablet by mouth 3 (three) times a week.   oxyCODONE-acetaminophen 5-325 MG tablet Commonly known as:  PERCOCET/ROXICET Take 1 tablet by mouth every 4 (four) hours as  needed for severe pain.   PROAIR HFA 108 (90 Base) MCG/ACT inhaler Generic drug:  albuterol Inhale 1 puff into the lungs 4 (four) times daily as needed for wheezing or shortness of breath.   SYMBICORT 160-4.5 MCG/ACT inhaler Generic drug:  budesonide-formoterol Inhale 1 puff into the lungs 2 (two) times daily.   tiotropium 18 MCG inhalation capsule Commonly known as:  SPIRIVA Place 18 mcg into inhaler and inhale daily.   traZODone 100 MG tablet Commonly known as:  DESYREL Take 200 mg by mouth at bedtime.   vitamin C 500 MG tablet Commonly known as:  ASCORBIC ACID Take 500 mg by mouth 3 (three) times a week.      Follow-up Information    Leta Baptist, MD Follow up on 07/06/2018.   Specialty:  Otolaryngology Why:  at [employer] information: [address] [place] [address] [phone]           Signed: Burley Saver 07/02/2018, 7:24 AM

## 2018-07-09 ENCOUNTER — Ambulatory Visit (INDEPENDENT_AMBULATORY_CARE_PROVIDER_SITE_OTHER): Payer: Medicaid Other | Admitting: Otolaryngology

## 2018-07-23 ENCOUNTER — Ambulatory Visit (INDEPENDENT_AMBULATORY_CARE_PROVIDER_SITE_OTHER): Payer: Medicaid Other | Admitting: Otolaryngology

## 2018-07-24 ENCOUNTER — Other Ambulatory Visit (HOSPITAL_COMMUNITY): Payer: Self-pay | Admitting: Internal Medicine

## 2018-07-24 DIAGNOSIS — R911 Solitary pulmonary nodule: Secondary | ICD-10-CM

## 2018-09-10 ENCOUNTER — Encounter: Payer: Self-pay | Admitting: Internal Medicine

## 2018-10-22 ENCOUNTER — Ambulatory Visit (HOSPITAL_COMMUNITY)
Admission: RE | Admit: 2018-10-22 | Discharge: 2018-10-22 | Disposition: A | Discharge status: Home / Self Care | Payer: Medicaid Other | Source: Ambulatory Visit | Attending: Internal Medicine | Admitting: Internal Medicine

## 2018-10-22 ENCOUNTER — Other Ambulatory Visit (HOSPITAL_COMMUNITY): Payer: Self-pay | Admitting: Internal Medicine

## 2018-10-22 DIAGNOSIS — R911 Solitary pulmonary nodule: Secondary | ICD-10-CM

## 2018-12-29 ENCOUNTER — Encounter: Payer: Self-pay | Admitting: Nurse Practitioner

## 2018-12-29 ENCOUNTER — Telehealth: Payer: Self-pay

## 2018-12-29 ENCOUNTER — Ambulatory Visit: Payer: Medicaid Other | Admitting: Nurse Practitioner

## 2018-12-29 ENCOUNTER — Other Ambulatory Visit: Payer: Self-pay

## 2018-12-29 VITALS — BP 129/81 | HR 94 | Temp 97.5°F | Ht 73.0 in | Wt 318.6 lb

## 2018-12-29 DIAGNOSIS — Z86010 Personal history of colonic polyps: Secondary | ICD-10-CM

## 2018-12-29 MED ORDER — CLENPIQ 10-3.5-12 MG-GM -GM/160ML PO SOLN: 1.0000 | Freq: Once | ORAL | 0 refills | Status: AC

## 2018-12-29 NOTE — Telephone Encounter (Signed)
Called and informed pt of pre-op appt 02/18/19 at 11:00am. Letter mailed.

## 2018-12-29 NOTE — Progress Notes (Signed)
Primary Care Physician:  [name], MD Primary Gastroenterologist:  Dr. Gala Romney  Chief Complaint  Patient presents with  . Consult    TCS. last had done 2016    HPI:   Stephen Warren is a 63 y.o. male who presents for recall colonoscopy.  Nurse/phone triage was deferred office visit due to medications likely necessitating augmented sedation.  He was last seen in our office 10/16/2015 to schedule a colonoscopy and for noted hemorrhoids.  Moderate symptoms including anal leakage, no hematochezia.  Occasional rectal irritation/pain as well.  Recommended colonoscopy.  Last colonoscopy was completed 10/30/2015 on propofol/MAC which found internal hemorrhoids, multiple colon polyps including a 1.25 cm "angry" appearing pedunculated polyp in the proximal sigmoid colon.  Redundant colon.  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  Recommended repeat colonoscopy in 3 years (2019).  He is currently due.  Today he states he's doing well overall. He quit smoking a year ago, has put on some weight since. He tried weight loss pills which he couldn't tolerate. Is trying to lose weight now. Denies abdominal pain, N/V, hematochezia, melena, fever, chills, unintentional weight loss. Denies chest pain, dyspnea, dizziness, lightheadedness, syncope, near syncope. Denies any other upper or lower GI symptoms.  Past Medical History:  Diagnosis Date  . ADD (attention deficit disorder)   . Arthritis   . Asthma   . Back pain   . COPD (chronic obstructive pulmonary disease) ([place])   . Depression   . 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 Hemorrhoids   . Insomnia   . Knee pain   . Panic attacks     Past Surgical History:  Procedure Laterality Date  . BIOPSY  10/30/2015   Procedure: BIOPSY;  Surgeon: Daneil Dolin, MD;  Location: [facility]  ENDO SUITE;  Service: Endoscopy;;  ileocecal valve ulcer  . COLONOSCOPY WITH PROPOFOL N/A 10/30/2015   Procedure: COLONOSCOPY WITH PROPOFOL;  Surgeon: Daneil Dolin, MD;  Location: [facility] ENDO SUITE;  Service: Endoscopy;  Laterality: N/A;  1000   . NASAL SEPTOPLASTY W/ TURBINOPLASTY  07/01/2018  . NASAL SEPTOPLASTY W/ TURBINOPLASTY Bilateral 07/01/2018   Procedure: NASAL SEPTOPLASTY WITH BILATERAL TURBINATE REDUCTION;  Surgeon: Leta Baptist, MD;  Location: [place];  Service: ENT;  Laterality: Bilateral;  . None to date  10/16/15  . POLYPECTOMY  10/30/2015   Procedure: POLYPECTOMY;  Surgeon: Daneil Dolin, MD;  Location: [facility] END[facility];  Service: Endoscopy;;  sigmoid polypectomy  x3    Current Outpatient Medications  Medication Sig Dispense Refill  . albuterol (PROVENTIL) (2.5 MG/3ML) 0.083% nebulizer solution Take 2.5 mg by nebulization every 6 (six) hours as needed for wheezing or shortness of breath.    . baclofen (LIORESAL) 20 MG tablet Take 20 mg by mouth 3 (three) times daily.  4  . citalopram (CELEXA) 20 MG tablet Take 20 mg by mouth daily.    . hydrocortisone cream 1 % Apply 1 application topically 2 (two) times daily as needed for itching.    . Multiple Vitamin (MULTIVITAMIN WITH MINERALS) TABS tablet Take 1 tablet by mouth daily.     Marland Kitchen PROAIR HFA 108 (90 Base) MCG/ACT inhaler Inhale 1 puff into the lungs 4 (four) times daily as needed for wheezing or shortness of breath.  3  . SYMBICORT 160-4.5 MCG/ACT inhaler Inhale  1 puff into the lungs 2 (two) times daily.  3  . tiotropium (SPIRIVA) 18 MCG inhalation capsule Place 18 mcg into inhaler and inhale daily.    . traZODone (DESYREL) 100 MG tablet Take 200 mg by mouth as needed.   5   No current facility-administered medications for this visit.     Allergies as of 12/29/2018 - Review Complete 12/29/2018  Allergen Reaction Noted  . Other Anaphylaxis 10/25/2015  . Iodine  10/16/2015  . Shellfish allergy Swelling 10/16/2015    Family History    Problem Relation Age of Onset  . Colon cancer Neg Hx     Social History   Socioeconomic History  . Marital status: Single    Spouse name: Not on file  . Number of children: Not on file  . Years of education: Not on file  . Highest education level: Not on file  Occupational History  . Not on file  Social Needs  . Financial resource strain: Not on file  . Food insecurity:    Worry: Not on file    Inability: Not on file  . Transportation needs:    Medical: Not on file    Non-medical: Not on file  Tobacco Use  . Smoking status: Former Smoker    Packs/day: 1.00    Years: 45.00    Pack years: 45.00    Types: Cigarettes    Last attempt to quit: 12/25/2017    Years since quitting: 1.0  . Smokeless tobacco: Never Used  . Tobacco comment: Quit this year, now on oxygen.   Substance and Sexual Activity  . 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 Drug use: No  . Sexual activity: Not on file  Lifestyle  . Physical activity:    Days per week: Not on file    Minutes per session: Not on file  . Stress: Not on file  Relationships  . Social connections:    Talks on phone: Not on file    Gets together: Not on file    Attends religious service: Not on file    Active member of club or organization: Not on file    Attends meetings of clubs or organizations: Not on file    Relationship status: Not on file  . Intimate partner violence:    Fear of current or ex partner: Not on file    Emotionally abused: Not on file    Physically abused: Not on file    Forced sexual activity: Not on file  Other Topics Concern  . Not on file  Social History Narrative  . Not on file    Review of Systems: General: Negative for anorexia, weight loss, fever, chills, fatigue, weakness. ENT: Negative for hoarseness, difficulty swallowing. CV: Negative for chest pain, angina, palpitations, peripheral  edema.  Respiratory: Negative for dyspnea at rest, cough, sputum, wheezing.  GI: See history of present illness. MS: Negative for joint pain, low back pain.  Derm: Negative for rash or itching.   Endo: Negative for unusual weight change.  Heme: Negative for bruising or bleeding. Allergy: Negative for rash or hives.    Physical Exam: BP 129/81   Pulse 94   Temp (!) 97.5 F (36.4 C) (Oral)   Ht _0  (1.854 m)   Wt (!) 318 lb 9.6 oz (144.5 kg)   BMI 42.03 kg/m  General:   Alert  and oriented. Pleasant and cooperative. Well-nourished and well-developed.  Head:  Normocephalic and atraumatic. Eyes:  Without icterus, sclera clear and conjunctiva pink.  Ears:  Normal auditory acuity. Cardiovascular:  S1, S2 present without murmurs appreciated. Extremities without clubbing or edema. Respiratory:  Clear to auscultation bilaterally. No wheezes, rales, or rhonchi. No distress.  Gastrointestinal:  +BS, soft, non-tender and non-distended. No HSM noted. No guarding or rebound. No masses appreciated.  Rectal:  Deferred  Musculoskalatal:  Symmetrical without gross deformities. Neurologic:  Alert and oriented x4;  grossly normal neurologically. Psych:  Alert and cooperative. Normal mood and affect. Heme/Lymph/Immune: No excessive bruising noted.    12/29/2018 11:22 AM   Disclaimer: This note was dictated with voice recognition software. Similar sounding words can inadvertently be transcribed and may not be corrected upon review.

## 2018-12-29 NOTE — Progress Notes (Signed)
cc'ed to pcp °

## 2018-12-29 NOTE — Patient Instructions (Signed)
Your health issues we discussed today were:   Need for colonoscopy: 1. We will schedule your colonoscopy for you 2. Further recommendations will be made after your colonoscopy  Overall I recommend:  1. Follow-up based on recommendations made after your colonoscopy 2. Call us if you have any questions or concerns 3. Keep your excellent sense of humor!!!!  At [facility] we value your feedback. You may receive a survey about your visit today. Please share your experience as we strive to create trusting relationships with our patients to provide genuine, compassionate, quality care.  We appreciate your understanding and patience as we review any laboratory studies, imaging, and other diagnostic tests that are ordered as we care for you. Our office policy is 5 business days for review of these results, and any emergent or urgent results are addressed in a timely manner for your best interest. If you do not hear from our office in 1 week, please contact us.   We also encourage the use of MyChart, which contains your medical information for your review as well. If you are not enrolled in this feature, an access code is on this after visit summary for your convenience. Thank you for allowing [place] to be involved in your care.  It was great to see you today!  I hope you have a great day and keep laughing!!

## 2018-12-29 NOTE — Assessment & Plan Note (Signed)
History of colon polyps and recommended 3-year repeat colonoscopy.  This included 1.25 cm "angry" appearing pedunculated polyp which was tubular adenoma.  He is currently due for his repeat colonoscopy.  Denies any overt GI symptoms at this time.  Will likely need propofol/MAC due to medications.  We will proceed with scheduling of his colonoscopy at this time.  Proceed with TCS on propofol/MAC with Dr. Gala Romney in near future: the risks, benefits, and alternatives have been discussed with the patient in detail. The patient states understanding and desires to proceed.  The patient is currently on Celexa, Desyrel.  No other anticoagulants, anxiolytics, chronic pain medications, or antidepressants.  We will plan for the procedure on propofol/MAC to promote adequate sedation.

## 2019-02-01 ENCOUNTER — Telehealth: Payer: Self-pay

## 2019-02-01 NOTE — Telephone Encounter (Signed)
Pt called and said he needs to reschedule his procedure that is scheduled in April. He will need an appointment earlier in the day due to [facility] transportation. His call back number is [phone].  Forwarding to [place] who scheduled him.

## 2019-02-01 NOTE — Telephone Encounter (Signed)
Called pt, TCS w/Propofol w/RMR rescheduled to 03/29/19 at 10:00am. Endo scheduler informed. Pre-op appt rescheduled to 03/25/19 at 12:45pm. Letter mailed with new procedure instructions.

## 2019-02-18 ENCOUNTER — Other Ambulatory Visit (HOSPITAL_COMMUNITY): Payer: Medicaid Other

## 2019-03-18 ENCOUNTER — Telehealth: Payer: Self-pay | Admitting: *Deleted

## 2019-03-18 NOTE — Telephone Encounter (Signed)
Called patient and is aware d/t COVID-19 restrictions procedure needs to br r/s'd. Patient is now scheduled for 7/9 at 10:45am. Patient aware will mail instructions and new pre-op appointment (confirmed address). Called endo and is aware of change.

## 2019-03-25 ENCOUNTER — Other Ambulatory Visit (HOSPITAL_COMMUNITY): Payer: Medicaid Other

## 2019-05-05 ENCOUNTER — Other Ambulatory Visit (HOSPITAL_COMMUNITY): Payer: Self-pay | Admitting: Respiratory Therapy

## 2019-05-05 DIAGNOSIS — J441 Chronic obstructive pulmonary disease with (acute) exacerbation: Secondary | ICD-10-CM

## 2019-05-10 ENCOUNTER — Telehealth: Payer: Self-pay | Admitting: Internal Medicine

## 2019-05-10 NOTE — Telephone Encounter (Signed)
Called patient. He states he has been having a lot of breathing issues related to his COPD. He wants to cancel procedure for now. He did not want to r/s yet as he states his breathing has not been doing well for a while. Called endo and LMOVM to cancel. FYI to EG

## 2019-05-10 NOTE — Telephone Encounter (Signed)
PATIENT CALLED TWICE AND LEFT MESSAGES THAT HE WANTED TO CANCEL HIS PROCEDURE DUE TO HIS BREATHING ISSUES

## 2019-05-13 ENCOUNTER — Inpatient Hospital Stay (HOSPITAL_COMMUNITY): Admission: RE | Admit: 2019-05-13 | Payer: Medicaid Other | Source: Ambulatory Visit

## 2019-05-20 ENCOUNTER — Encounter (HOSPITAL_COMMUNITY): Admission: RE | Payer: Self-pay | Source: Home / Self Care

## 2019-05-20 ENCOUNTER — Ambulatory Visit (HOSPITAL_COMMUNITY): Admission: RE | Admit: 2019-05-20 | Payer: Medicaid Other | Source: Home / Self Care | Admitting: Internal Medicine

## 2019-05-20 SURGERY — COLONOSCOPY WITH PROPOFOL
Anesthesia: Monitor Anesthesia Care

## 2019-05-25 ENCOUNTER — Other Ambulatory Visit: Payer: Self-pay

## 2019-05-25 ENCOUNTER — Other Ambulatory Visit (HOSPITAL_COMMUNITY)
Admission: RE | Admit: 2019-05-25 | Discharge: 2019-05-25 | Disposition: A | Discharge status: Home / Self Care | Payer: Medicaid Other | Source: Ambulatory Visit | Attending: Pulmonary Disease | Admitting: Pulmonary Disease

## 2019-05-26 ENCOUNTER — Ambulatory Visit (HOSPITAL_COMMUNITY)
Admission: RE | Admit: 2019-05-26 | Discharge: 2019-05-26 | Disposition: A | Discharge status: Home / Self Care | Payer: Medicaid Other | Source: Ambulatory Visit | Attending: Pulmonary Disease | Admitting: Pulmonary Disease

## 2019-05-26 ENCOUNTER — Other Ambulatory Visit: Payer: Self-pay

## 2019-05-26 DIAGNOSIS — J441 Chronic obstructive pulmonary disease with (acute) exacerbation: Secondary | ICD-10-CM

## 2019-05-26 LAB — PULMONARY FUNCTION TEST
DL/VA % pred: 83 %
DL/VA: 3.43 ml/min/mmHg/L
DLCO unc % pred: 69 %
DLCO unc: 20.57 ml/min/mmHg
FEF 25-75 Post: 0.68 L/sec
FEF 25-75 Pre: 0.34 L/sec
FEF2575-%Change-Post: 99 %
FEF2575-%Pred-Post: 21 %
FEF2575-%Pred-Pre: 10 %
FEV1-%Change-Post: 25 %
FEV1-%Pred-Post: 33 %
FEV1-%Pred-Pre: 26 %
FEV1-Post: 1.32 L
FEV1-Pre: 1.05 L
FEV1FVC-%Change-Post: -3 %
FEV1FVC-%Pred-Pre: 57 %
FEV6-%Change-Post: 24 %
FEV6-%Pred-Post: 52 %
FEV6-%Pred-Pre: 42 %
FEV6-Post: 2.59 L
FEV6-Pre: 2.09 L
FEV6FVC-%Change-Post: -4 %
FEV6FVC-%Pred-Post: 86 %
FEV6FVC-%Pred-Pre: 91 %
FVC-%Change-Post: 29 %
FVC-%Pred-Post: 60 %
FVC-%Pred-Pre: 46 %
FVC-Post: 3.14 L
FVC-Pre: 2.43 L
Post FEV1/FVC ratio: 42 %
Post FEV6/FVC ratio: 83 %
Pre FEV1/FVC ratio: 43 %
Pre FEV6/FVC Ratio: 87 %

## 2019-05-26 MED ORDER — ALBUTEROL SULFATE (2.5 MG/3ML) 0.083% IN NEBU
2.5000 mg | INHALATION_SOLUTION | Freq: Once | RESPIRATORY_TRACT | Status: AC
  Administered 2019-05-26: 2.5 mg via RESPIRATORY_TRACT

## 2019-07-03 ENCOUNTER — Other Ambulatory Visit: Payer: Self-pay

## 2019-07-03 ENCOUNTER — Inpatient Hospital Stay (HOSPITAL_COMMUNITY)
Admission: EM | Admit: 2019-07-03 | Discharge: 2019-07-07 | DRG: 377 | Disposition: A | Discharge status: Home / Self Care | Payer: Medicaid Other | Attending: Internal Medicine | Admitting: Internal Medicine

## 2019-07-03 ENCOUNTER — Emergency Department (HOSPITAL_COMMUNITY): Payer: Medicaid Other

## 2019-07-03 ENCOUNTER — Encounter (HOSPITAL_COMMUNITY): Payer: Self-pay

## 2019-07-03 DIAGNOSIS — Z6841 Body Mass Index (BMI) 40.0 and over, adult: Secondary | ICD-10-CM | POA: Diagnosis not present

## 2019-07-03 DIAGNOSIS — D649 Anemia, unspecified: Secondary | ICD-10-CM | POA: Diagnosis present

## 2019-07-03 DIAGNOSIS — F32A Depression, unspecified: Secondary | ICD-10-CM | POA: Diagnosis present

## 2019-07-03 DIAGNOSIS — G4700 Insomnia, unspecified: Secondary | ICD-10-CM | POA: Diagnosis present

## 2019-07-03 DIAGNOSIS — Z713 Dietary counseling and surveillance: Secondary | ICD-10-CM | POA: Diagnosis not present

## 2019-07-03 DIAGNOSIS — F3289 Other specified depressive episodes: Secondary | ICD-10-CM | POA: Diagnosis not present

## 2019-07-03 DIAGNOSIS — J449 Chronic obstructive pulmonary disease, unspecified: Secondary | ICD-10-CM | POA: Diagnosis present

## 2019-07-03 DIAGNOSIS — K269 Duodenal ulcer, unspecified as acute or chronic, without hemorrhage or perforation: Secondary | ICD-10-CM | POA: Diagnosis not present

## 2019-07-03 DIAGNOSIS — K922 Gastrointestinal hemorrhage, unspecified: Secondary | ICD-10-CM | POA: Diagnosis not present

## 2019-07-03 DIAGNOSIS — H5350 Unspecified color vision deficiencies: Secondary | ICD-10-CM | POA: Diagnosis present

## 2019-07-03 DIAGNOSIS — Z79891 Long term (current) use of opiate analgesic: Secondary | ICD-10-CM

## 2019-07-03 DIAGNOSIS — D62 Acute posthemorrhagic anemia: Secondary | ICD-10-CM | POA: Diagnosis present

## 2019-07-03 DIAGNOSIS — F329 Major depressive disorder, single episode, unspecified: Secondary | ICD-10-CM | POA: Diagnosis present

## 2019-07-03 DIAGNOSIS — Z91013 Allergy to seafood: Secondary | ICD-10-CM | POA: Diagnosis not present

## 2019-07-03 DIAGNOSIS — F909 Attention-deficit hyperactivity disorder, unspecified type: Secondary | ICD-10-CM | POA: Diagnosis present

## 2019-07-03 DIAGNOSIS — Z79899 Other long term (current) drug therapy: Secondary | ICD-10-CM

## 2019-07-03 DIAGNOSIS — K264 Chronic or unspecified duodenal ulcer with hemorrhage: Secondary | ICD-10-CM | POA: Diagnosis present

## 2019-07-03 DIAGNOSIS — K449 Diaphragmatic hernia without obstruction or gangrene: Secondary | ICD-10-CM | POA: Diagnosis present

## 2019-07-03 DIAGNOSIS — Z9981 Dependence on supplemental oxygen: Secondary | ICD-10-CM | POA: Diagnosis not present

## 2019-07-03 DIAGNOSIS — M47816 Spondylosis without myelopathy or radiculopathy, lumbar region: Secondary | ICD-10-CM | POA: Diagnosis present

## 2019-07-03 DIAGNOSIS — J9621 Acute and chronic respiratory failure with hypoxia: Secondary | ICD-10-CM | POA: Diagnosis present

## 2019-07-03 DIAGNOSIS — Z7951 Long term (current) use of inhaled steroids: Secondary | ICD-10-CM

## 2019-07-03 DIAGNOSIS — K2970 Gastritis, unspecified, without bleeding: Secondary | ICD-10-CM | POA: Diagnosis present

## 2019-07-03 DIAGNOSIS — R0602 Shortness of breath: Secondary | ICD-10-CM

## 2019-07-03 DIAGNOSIS — G4730 Sleep apnea, unspecified: Secondary | ICD-10-CM | POA: Diagnosis present

## 2019-07-03 DIAGNOSIS — Z87891 Personal history of nicotine dependence: Secondary | ICD-10-CM

## 2019-07-03 DIAGNOSIS — Z20828 Contact with and (suspected) exposure to other viral communicable diseases: Secondary | ICD-10-CM | POA: Diagnosis present

## 2019-07-03 DIAGNOSIS — Z888 Allergy status to other drugs, medicaments and biological substances status: Secondary | ICD-10-CM

## 2019-07-03 DIAGNOSIS — J42 Unspecified chronic bronchitis: Secondary | ICD-10-CM | POA: Diagnosis not present

## 2019-07-03 DIAGNOSIS — K921 Melena: Secondary | ICD-10-CM | POA: Diagnosis not present

## 2019-07-03 DIAGNOSIS — F410 Panic disorder [episodic paroxysmal anxiety] without agoraphobia: Secondary | ICD-10-CM | POA: Diagnosis present

## 2019-07-03 LAB — HEPATIC FUNCTION PANEL
ALT: 23 U/L (ref 0–44)
AST: 20 U/L (ref 15–41)
Albumin: 3.6 g/dL (ref 3.5–5.0)
Alkaline Phosphatase: 58 U/L (ref 38–126)
Bilirubin, Direct: 0.1 mg/dL (ref 0.0–0.2)
Indirect Bilirubin: 0.3 mg/dL (ref 0.3–0.9)
Total Bilirubin: 0.4 mg/dL (ref 0.3–1.2)
Total Protein: 6.5 g/dL (ref 6.5–8.1)

## 2019-07-03 LAB — BRAIN NATRIURETIC PEPTIDE: B Natriuretic Peptide: 71 pg/mL (ref 0.0–100.0)

## 2019-07-03 LAB — PROTIME-INR
INR: 1 (ref 0.8–1.2)
Prothrombin Time: 13.3 seconds (ref 11.4–15.2)

## 2019-07-03 LAB — BASIC METABOLIC PANEL
Anion gap: 7 (ref 5–15)
BUN: 34 mg/dL — ABNORMAL HIGH (ref 8–23)
CO2: 25 mmol/L (ref 22–32)
Calcium: 8.5 mg/dL — ABNORMAL LOW (ref 8.9–10.3)
Chloride: 105 mmol/L (ref 98–111)
Creatinine, Ser: 0.95 mg/dL (ref 0.61–1.24)
GFR calc Af Amer: >60 mL/min (ref >60)
GFR calc non Af Amer: >60 mL/min (ref >60)
Glucose, Bld: 156 mg/dL — ABNORMAL HIGH (ref 70–99)
Potassium: 4.7 mmol/L (ref 3.5–5.1)
Sodium: 137 mmol/L (ref 135–145)

## 2019-07-03 LAB — ABO/RH: ABO/RH(D): AB POS

## 2019-07-03 LAB — LACTIC ACID, PLASMA
Lactic Acid, Venous: 2 mmol/L (ref 0.5–1.9)
Lactic Acid, Venous: 1.2 mmol/L (ref 0.5–1.9)

## 2019-07-03 LAB — CBC
HCT: 24.6 % — ABNORMAL LOW (ref 39.0–52.0)
Hemoglobin: 7.8 g/dL — ABNORMAL LOW (ref 13.0–17.0)
MCH: 31.1 pg (ref 26.0–34.0)
MCHC: 31.7 g/dL (ref 30.0–36.0)
MCV: 98 fL (ref 80.0–100.0)
Platelets: 270 10*3/uL (ref 150–400)
RBC: 2.51 MIL/uL — ABNORMAL LOW (ref 4.22–5.81)
RDW: 13.4 % (ref 11.5–15.5)
WBC: 19.1 10*3/uL — ABNORMAL HIGH (ref 4.0–10.5)
nRBC: 0.6 % — ABNORMAL HIGH (ref 0.0–0.2)

## 2019-07-03 LAB — SARS CORONAVIRUS 2 BY RT PCR (HOSPITAL ORDER, PERFORMED IN ~~LOC~~ HOSPITAL LAB): SARS Coronavirus 2: NEGATIVE

## 2019-07-03 LAB — PREPARE RBC (CROSSMATCH)

## 2019-07-03 LAB — OCCULT BLOOD, POC DEVICE: Fecal Occult Bld: POSITIVE — AB

## 2019-07-03 MED ORDER — SODIUM CHLORIDE 0.9 % IV SOLN
80.0000 mg | Freq: Once | INTRAVENOUS | Status: AC
  Administered 2019-07-03: 80 mg via INTRAVENOUS
  Filled 2019-07-03: qty 40

## 2019-07-03 MED ORDER — BACLOFEN 10 MG PO TABS
20.0000 mg | ORAL_TABLET | Freq: Three times a day (TID) | ORAL | Status: DC
  Administered 2019-07-04 – 2019-07-07 (×10): 20 mg via ORAL
  Filled 2019-07-03 (×11): qty 2

## 2019-07-03 MED ORDER — TRAZODONE HCL 50 MG PO TABS: 200.0000 mg | ORAL_TABLET | Freq: Every evening | ORAL | Status: DC | PRN

## 2019-07-03 MED ORDER — SODIUM CHLORIDE 0.9 % IV SOLN
INTRAVENOUS | Status: DC
  Administered 2019-07-03: 15:00:00 via INTRAVENOUS

## 2019-07-03 MED ORDER — ALBUTEROL SULFATE (2.5 MG/3ML) 0.083% IN NEBU
2.5000 mg | INHALATION_SOLUTION | Freq: Four times a day (QID) | RESPIRATORY_TRACT | Status: DC | PRN
  Administered 2019-07-03 – 2019-07-06 (×4): 2.5 mg via RESPIRATORY_TRACT
  Filled 2019-07-03 (×6): qty 3

## 2019-07-03 MED ORDER — ORAL CARE MOUTH RINSE
15.0000 mL | Freq: Two times a day (BID) | OROMUCOSAL | Status: DC
  Administered 2019-07-04: 15 mL via OROMUCOSAL

## 2019-07-03 MED ORDER — SODIUM CHLORIDE 0.9 % IV BOLUS
500.0000 mL | Freq: Once | INTRAVENOUS | Status: AC
  Administered 2019-07-03: 15:00:00 500 mL via INTRAVENOUS

## 2019-07-03 MED ORDER — SODIUM CHLORIDE 0.9% IV SOLUTION: Freq: Once | INTRAVENOUS | Status: DC

## 2019-07-03 MED ORDER — UMECLIDINIUM BROMIDE 62.5 MCG/INH IN AEPB
1.0000 | INHALATION_SPRAY | Freq: Every day | RESPIRATORY_TRACT | Status: DC
  Administered 2019-07-05 – 2019-07-07 (×3): 1 via RESPIRATORY_TRACT
  Filled 2019-07-03: qty 7

## 2019-07-03 MED ORDER — MOMETASONE FURO-FORMOTEROL FUM 200-5 MCG/ACT IN AERO
2.0000 | INHALATION_SPRAY | Freq: Two times a day (BID) | RESPIRATORY_TRACT | Status: DC
  Administered 2019-07-03 – 2019-07-07 (×8): 2 via RESPIRATORY_TRACT
  Filled 2019-07-03 (×2): qty 8.8

## 2019-07-03 MED ORDER — SODIUM CHLORIDE 0.9 % IV SOLN
8.0000 mg/h | INTRAVENOUS | Status: AC
  Administered 2019-07-03 – 2019-07-07 (×8): 8 mg/h via INTRAVENOUS
  Filled 2019-07-03 (×11): qty 80

## 2019-07-03 MED ORDER — TIOTROPIUM BROMIDE MONOHYDRATE 18 MCG IN CAPS: 18.0000 ug | ORAL_CAPSULE | Freq: Every day | RESPIRATORY_TRACT | Status: DC

## 2019-07-03 MED ORDER — SODIUM CHLORIDE 0.9 % IV SOLN: 10.0000 mL/h | Freq: Once | INTRAVENOUS | Status: DC

## 2019-07-03 MED ORDER — CITALOPRAM HYDROBROMIDE 20 MG PO TABS
20.0000 mg | ORAL_TABLET | Freq: Every day | ORAL | Status: DC
  Administered 2019-07-04 – 2019-07-07 (×4): 20 mg via ORAL
  Filled 2019-07-03 (×5): qty 1

## 2019-07-03 NOTE — ED Notes (Signed)
CRITICAL VALUE ALERT  Critical Value:  Lactic Acid 2.0  Date & Time Notied: 07/03/2019 @ [id number]  Provider Notified: Dr Rogene Houston  Orders Received/Actions taken: Orders to be given

## 2019-07-03 NOTE — ED Notes (Signed)
ED TO INPATIENT HANDOFF REPORT  ED Nurse Name and Phone #: [phone]  S Name/Age/Gender [name] 63 y.o. male Room/Bed: APA09/APA09  Code Status   Code Status: Prior  Home/SNF/Other Home Patient oriented to: self Is this baseline? Yes   Triage Complete: Triage complete  Chief Complaint shortness of Breath  Triage Note Pt has been having increasing SOB as well as sweating profusely. Has a history of COPD. Is also complaining of having black stools. Hasn't had a normal BM in 4 days. Pt is also feeling faint.    Allergies Allergies  Allergen Reactions  . Other Anaphylaxis    Caterpillar   . Iodine     Not sure of reaction  . Shellfish Allergy Swelling    Level of Care/Admitting Diagnosis ED Disposition    ED Disposition Condition [facility] [facility] Area: [facility] [id number]  Level of Care: Med-Surg [16]  Covid Evaluation: Asymptomatic Screening Protocol (No Symptoms)  Diagnosis: Symptomatic anemia [id number]  Admitting Physician: Truett Mainland [[id number]]  Attending Physician: Truett Mainland [[id number]]  Estimated length of stay: inpatient only procedure  Certification:: I certify this patient will need inpatient services for at least 2 midnights  PT Class (Do Not Modify): Inpatient [101]  PT Acc Code (Do Not Modify): Private [1]       B Medical/Surgery History Past Medical History:  Diagnosis Date  . ADD (attention deficit disorder)   . Arthritis   . Asthma   . Back pain   . COPD (chronic obstructive pulmonary disease) ([place])   . Depression   . 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 Hemorrhoids   . Insomnia   . Knee pain   . Panic attacks    Past Surgical History:  Procedure Laterality Date  . BIOPSY  10/30/2015   Procedure: BIOPSY;  Surgeon: Daneil Dolin, MD;  [facility];  Service: Endoscopy;;  ileocecal valve ulcer  . COLONOSCOPY WITH PROPOFOL N/A 10/30/2015   Procedure:  COLONOSCOPY WITH PROPOFOL;  Surgeon: Daneil Dolin, MD;  [facility];  Service: Endoscopy;  Laterality: N/A;  1000   . NASAL SEPTOPLASTY W/ TURBINOPLASTY  07/01/2018  . NASAL SEPTOPLASTY W/ TURBINOPLASTY Bilateral 07/01/2018   Procedure: NASAL SEPTOPLASTY WITH BILATERAL TURBINATE REDUCTION;  Surgeon: Leta Baptist, MD;  Location: [facility];  Service: ENT;  Laterality: Bilateral;  . None to date  10/16/15  . POLYPECTOMY  10/30/2015   Procedure: POLYPECTOMY;  Surgeon: Daneil Dolin, MD;  [facility];  Service: Endoscopy;;  sigmoid polypectomy  x3     A IV Location/Drains/Wounds Patient Lines/Drains/Airways Status   Active Line/Drains/Airways    Name:   Placement date:   Placement time:   Site:   Days:   Peripheral IV 07/03/19 Right Antecubital   07/03/19    1334    Antecubital   less than 1   Peripheral IV 07/03/19 Right Forearm   07/03/19    1511    Forearm   less than 1   Incision (Closed) 07/01/18 Nose Other (Comment)   07/01/18    0857     367          Intake/Output Last 24 hours  Intake/Output Summary (Last 24 hours) at 07/03/2019 1730 Last data filed at 07/03/2019 1545 Gross per 24 hour  Intake 602.26 ml  Output -  Net 602.26 ml    Labs/Imaging Results for orders placed  or performed during the hospital encounter of 07/03/19 (from the past 48 hour(s))  Basic metabolic panel     Status: Abnormal   Collection Time: 07/03/19  1:32 PM  Result Value Ref Range   Sodium 137 135 - 145 mmol/L   Potassium 4.7 3.5 - 5.1 mmol/L   Chloride 105 98 - 111 mmol/L   CO2 25 22 - 32 mmol/L   Glucose, Bld 156 (H) 70 - 99 mg/dL   BUN 34 (H) 8 - 23 mg/dL   Creatinine, Ser 0.95 0.61 - 1.24 mg/dL   Calcium 8.5 (L) 8.9 - 10.3 mg/dL   GFR calc non Af Amer >60 >60 mL/min   GFR calc Af Amer >60 >60 mL/min   Anion gap 7 5 - 15    Comment: Performed at [facility], [address], [place], [place] [address]  CBC     Status: Abnormal   Collection Time: 07/03/19  1:32 PM  Result  Value Ref Range   WBC 19.1 (H) 4.0 - 10.5 K/uL   RBC 2.51 (L) 4.22 - 5.81 MIL/uL   Hemoglobin 7.8 (L) 13.0 - 17.0 g/dL   HCT 24.6 (L) 39.0 - 52.0 %   MCV 98.0 80.0 - 100.0 fL   MCH 31.1 26.0 - 34.0 pg   MCHC 31.7 30.0 - 36.0 g/dL   RDW 13.4 11.5 - 15.5 %   Platelets 270 150 - 400 K/uL   nRBC 0.6 (H) 0.0 - 0.2 %    Comment: Performed at [facility], [address] [place], [place] [address]  Protime-INR     Status: None   Collection Time: 07/03/19  1:32 PM  Result Value Ref Range   Prothrombin Time 13.3 11.4 - 15.2 seconds   INR 1.0 0.8 - 1.2    Comment: (NOTE) INR goal varies based on device and disease states. Performed at [facility], [address] [place], [place] [address]   Hepatic function panel     Status: None   Collection Time: 07/03/19  1:32 PM  Result Value Ref Range   Total Protein 6.5 6.5 - 8.1 g/dL   Albumin 3.6 3.5 - 5.0 g/dL   AST 20 15 - 41 U/L   ALT 23 0 - 44 U/L   Alkaline Phosphatase 58 38 - 126 U/L   Total Bilirubin 0.4 0.3 - 1.2 mg/dL   Bilirubin, Direct 0.1 0.0 - 0.2 mg/dL   Indirect Bilirubin 0.3 0.3 - 0.9 mg/dL    Comment: Performed at [facility], [address] [place], [place] [address]  Brain natriuretic peptide     Status: None   Collection Time: 07/03/19  1:32 PM  Result Value Ref Range   B Natriuretic Peptide 71.0 0.0 - 100.0 pg/mL    Comment: Performed at [facility], [address] [place], [place] [address]  SARS Coronavirus 2 [facility] order, Performed in [facility] hospital lab) Nasopharyngeal Nasopharyngeal Swab     Status: None   Collection Time: 07/03/19  2:06 PM   Specimen: Nasopharyngeal Swab  Result Value Ref Range   SARS Coronavirus 2 NEGATIVE NEGATIVE    Comment: (NOTE) If result is NEGATIVE SARS-CoV-2 target nucleic acids are NOT DETECTED. The SARS-CoV-2 RNA is generally detectable in upper and lower  respiratory specimens during the acute phase of infection. The lowest  concentration of SARS-CoV-2 viral copies this  assay can detect is 250  copies / mL. A negative result does not preclude SARS-CoV-2 infection  and should not be  used as the sole basis for treatment or other  patient management decisions.  A negative result may occur with  improper specimen collection / handling, submission of specimen other  than nasopharyngeal swab, presence of viral mutation(s) within the  areas targeted by this assay, and inadequate number of viral copies  (<250 copies / mL). A negative result must be combined with clinical  observations, patient history, and epidemiological information. If result is POSITIVE SARS-CoV-2 target nucleic acids are DETECTED. The SARS-CoV-2 RNA is generally detectable in upper and lower  respiratory specimens dur ing the acute phase of infection.  Positive  results are indicative of active infection with SARS-CoV-2.  Clinical  correlation with patient history and other diagnostic information is  necessary to determine patient infection status.  Positive results do  not rule out bacterial infection or co-infection with other viruses. If result is PRESUMPTIVE POSTIVE SARS-CoV-2 nucleic acids MAY BE PRESENT.   A presumptive positive result was obtained on the submitted specimen  and confirmed on repeat testing.  While 2019 novel coronavirus  (SARS-CoV-2) nucleic acids may be present in the submitted sample  additional confirmatory testing may be necessary for epidemiological  and / or clinical management purposes  to differentiate between  SARS-CoV-2 and other Sarbecovirus currently known to infect humans.  If clinically indicated additional testing with an alternate test  methodology [phone]) is advised. The SARS-CoV-2 RNA is generally  detectable in upper and lower respiratory sp ecimens during the acute  phase of infection. The expected result is Negative. Fact Sheet for Patients:  StrictlyIdeas.no Fact Sheet for Healthcare  Providers: BankingDealers.co.za This test is not yet approved or cleared by the [place] FDA and has been authorized for detection and/or diagnosis of SARS-CoV-2 by FDA under an Emergency Use Authorization (EUA).  This EUA will remain in effect (meaning this test can be used) for the duration of the COVID-19 declaration under Section 564(b)(1) of the Act, 21 U.S.C. section 360bbb-3(b)(1), unless the authorization is terminated or revoked sooner. Performed at [facility], [address], [place], [place] [address]   Occult blood, poc device     Status: Abnormal   Collection Time: 07/03/19  3:02 PM  Result Value Ref Range   Fecal Occult Bld POSITIVE (A) NEGATIVE  ABO/Rh     Status: None   Collection Time: 07/03/19  3:10 PM  Result Value Ref Range   ABO/RH(D)      AB POS Performed at [facility], [address], [place], [place] [address]   Type and screen [facility]     Status: None (Preliminary result)   Collection Time: 07/03/19  3:14 PM  Result Value Ref Range   ABO/RH(D) AB POS    Antibody Screen NEG    Sample Expiration 07/06/2019,2359    Unit Number [id number]    Blood Component Type RED CELLS,LR    Unit division 00    Status of Unit REL FROM [facility]    Transfusion Status OK TO TRANSFUSE    Crossmatch Result Compatible    Unit Number [id number]    Blood Component Type RED CELLS,LR    Unit division 00    Status of Unit REL FROM [facility]    Transfusion Status OK TO TRANSFUSE    Crossmatch Result Compatible    Unit Number [id number]    Blood Component Type RBC LR PHER2    Unit division 00    Status of Unit ALLOCATED    Transfusion Status OK TO TRANSFUSE  Crossmatch Result      Compatible Performed at [facility], [address], [place], [place] [address]    Unit Number [id number]    Blood Component Type RED CELLS,LR    Unit division 00    Status of Unit ALLOCATED    Transfusion Status OK TO TRANSFUSE    Crossmatch  Result Compatible   Lactic acid, plasma     Status: Abnormal   Collection Time: 07/03/19  3:14 PM  Result Value Ref Range   Lactic Acid, Venous 2.0 (HH) 0.5 - 1.9 mmol/L    Comment: CRITICAL RESULT CALLED TO, READ BACK BY AND VERIFIED WITH: E.WILEY @ [id number] ON 8.22.20 BY LBB Performed at [facility], [address] [place], [place] [address]   Prepare RBC     Status: None   Collection Time: 07/03/19  3:14 PM  Result Value Ref Range   Order Confirmation      ORDER PROCESSED BY BLOOD BANK Performed at [facility], [address], [place], [place] [address]    Dg Chest 2 View  Result Date: 07/03/2019 CLINICAL DATA:  Dyspnea, COPD EXAM: CHEST - 2 VIEW COMPARISON:  02/10/2014 chest radiograph. FINDINGS: Stable cardiomediastinal silhouette with normal heart size. No pneumothorax. No pleural effusion. Hyperinflated lungs. No pulmonary edema. No acute consolidative airspace disease. IMPRESSION: 1. Hyperinflated lungs, compatible with the reported history of COPD. 2. No acute cardiopulmonary disease. Electronically Signed   By: Ilona Sorrel M.D.   On: 07/03/2019 12:32    Pending Labs Unresulted Labs (From admission, onward)    Start     Ordered   07/03/19 1619  Prepare RBC  (Adult Blood Administration - Red Blood Cells)  Once,   R    Question Answer Comment  # of Units 2 units   Transfusion Indications Symptomatic Anemia   Transfusion Indications Actively Bleeding / GI Bleed   If emergent release call blood bank Not emergent release      07/03/19 1618   07/03/19 1435  Lactic acid, plasma  Now then every 2 hours,   STAT     07/03/19 1434   07/03/19 1402  Culture, blood (routine x 2)  BLOOD CULTURE X 2,   STAT     07/03/19 1404   Signed and Held  CBC  Tomorrow morning,   R     Signed and Held   Signed and Held  Basic metabolic panel  Tomorrow morning,   R     Signed and Held          Vitals/Pain Today's Vitals   07/03/19 1430 07/03/19 1505 07/03/19 1545 07/03/19 1546  BP: 121/63   (!) 141/59   Pulse: 93  98   Resp: 12  20   Temp:  98.2 F (36.8 C)    TempSrc:  Rectal    SpO2: 100%  100%   Weight:      Height:      PainSc:    0-No pain    Isolation Precautions Airborne and Contact precautions  Medications Medications  pantoprazole (PROTONIX) 80 mg in sodium chloride 0.9 % 250 mL (0.32 mg/mL) infusion (8 mg/hr Intravenous New Bag/Given 07/03/19 1546)  0.9 %  sodium chloride infusion ( Intravenous New Bag/Given 07/03/19 1515)  0.9 %  sodium chloride infusion (has no administration in time range)  0.9 %  sodium chloride infusion (Manually program via Guardrails IV Fluids) (has no administration in time range)  pantoprazole (PROTONIX) 80 mg in sodium chloride 0.9 % 100  mL IVPB (0 mg Intravenous Stopped 07/03/19 1545)  sodium chloride 0.9 % bolus 500 mL (0 mLs Intravenous Stopped 07/03/19 1545)    Mobility walks Low fall risk   Focused Assessments    R Recommendations: See Admitting Provider Note  Report given to:   Additional Notes:

## 2019-07-03 NOTE — ED Provider Notes (Signed)
[facility] EMERGENCY DEPARTMENT Provider Note   CSN: [id number] Arrival date & time: 07/03/19  1130     History   Chief Complaint Chief Complaint  Patient presents with  . Shortness of Breath    HPI Stephen Warren is a 63 y.o. male with a past medical history of COPD on 2.5 L/min at baseline, asthma, diverticulosis, obesity, who presents today for evaluation of generally feeling unwell with shortness of breath and dark stools.  Reports that over the past 4 days he has had progressive worsening shortness of breath.  He states that he is okay if he lays still however once he gets up and moves he gets significantly short of breath.  He denies any fevers.  He reports that he has midline lower abdominal pain that started during this time.  Over the past 4 days he is having multiple bowel movements that are dark stools.  He denies any dysuria.  He states that he did take Pepto once however that was over a day ago.  His dark stools started before he took Pepto.  He does not take any blood thinning medications.       HPI  Past Medical History:  Diagnosis Date  . ADD (attention deficit disorder)   . Arthritis   . Asthma   . Back pain   . COPD (chronic obstructive pulmonary disease) ([place])   . Depression   . 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 Hemorrhoids   . Insomnia   . Knee pain   . Panic attacks     Patient Active Problem List   Diagnosis Date Noted  . S/P nasal septoplasty 07/01/2018  . History of colonic polyps   . Diverticulosis of colon without hemorrhage   . Mucosal abnormality of colon   . Encounter for screening colonoscopy 10/16/2015  . Hemorrhoids 10/16/2015    Past Surgical History:  Procedure Laterality Date  . BIOPSY  10/30/2015   Procedure: BIOPSY;  Surgeon: Daneil Dolin, MD;  [facility];  Service: Endoscopy;;  ileocecal valve ulcer  . COLONOSCOPY WITH PROPOFOL N/A 10/30/2015   Procedure: COLONOSCOPY  WITH PROPOFOL;  Surgeon: Daneil Dolin, MD;  [facility];  Service: Endoscopy;  Laterality: N/A;  1000   . NASAL SEPTOPLASTY W/ TURBINOPLASTY  07/01/2018  . NASAL SEPTOPLASTY W/ TURBINOPLASTY Bilateral 07/01/2018   Procedure: NASAL SEPTOPLASTY WITH BILATERAL TURBINATE REDUCTION;  Surgeon: Leta Baptist, MD;  Location: [facility];  Service: ENT;  Laterality: Bilateral;  . None to date  10/16/15  . POLYPECTOMY  10/30/2015   Procedure: POLYPECTOMY;  Surgeon: Daneil Dolin, MD;  [facility];  Service: Endoscopy;;  sigmoid polypectomy  x3        Home Medications    Prior to Admission medications   Medication Sig Start Date End Date Taking? Authorizing Provider  albuterol (PROVENTIL) (2.5 MG/3ML) 0.083% nebulizer solution Take 2.5 mg by nebulization every 6 (six) hours as needed for wheezing or shortness of breath.   Yes [provider]  baclofen (LIORESAL) 20 MG tablet Take 20 mg by mouth 3 (three) times daily. 06/10/18  Yes [provider]  citalopram (CELEXA) 20 MG tablet Take 20 mg by mouth daily.   Yes [provider]  Multiple Vitamin (MULTIVITAMIN WITH MINERALS) TABS tablet Take 1 tablet by mouth daily.    Yes [provider]  PROAIR HFA 108 (90 Base) MCG/ACT inhaler Inhale 1 puff  into the lungs 4 (four) times daily as needed for wheezing or shortness of breath. 06/10/18  Yes [provider]  SYMBICORT 160-4.5 MCG/ACT inhaler Inhale 1 puff into the lungs 2 (two) times daily. 06/11/18  Yes [provider]  tiotropium (SPIRIVA) 18 MCG inhalation capsule Place 18 mcg into inhaler and inhale daily.   Yes [provider]  traZODone (DESYREL) 100 MG tablet Take 200 mg by mouth as needed.  03/30/18  Yes [provider]  hydrocortisone cream 1 % Apply 1 application topically 2 (two) times daily as needed for itching.    [provider]  naproxen (NAPROSYN) 500 MG tablet Take 500 mg by mouth 2 (two) times  daily as needed. for pain 06/24/19   [provider]    Family History Family History  Problem Relation Age of Onset  . Colon cancer Neg Hx     Social History Social History   Tobacco Use  . Smoking status: Former Smoker    Packs/day: 1.00    Years: 45.00    Pack years: 45.00    Types: Cigarettes    Quit date: 12/25/2017    Years since quitting: 1.5  . Smokeless tobacco: Never Used  . Tobacco comment: Quit this year, now on oxygen.   Substance Use Topics  . 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 Drug use: No     Allergies   Other, Iodine, and Shellfish allergy   Review of Systems Review of Systems  Constitutional: Positive for fatigue. Negative for chills and fever.  Respiratory: Positive for chest tightness and shortness of breath. Negative for cough and wheezing.   Cardiovascular: Negative for chest pain, palpitations and leg swelling.  Gastrointestinal: Positive for abdominal pain, blood in stool, diarrhea and nausea. Negative for vomiting.  Endocrine: Negative for polyuria.  Genitourinary: Negative for dysuria and urgency.  Musculoskeletal: Negative for back pain and neck pain.  Neurological: Negative for weakness and headaches.  All other systems reviewed and are negative.    Physical Exam Updated Vital Signs BP (!) 141/59   Pulse 98   Temp 98.2 F (36.8 C) (Rectal)   Resp 20   Ht 6\' 1"  (1.854 m)   Wt (!) 145.2 kg   SpO2 100%   BMI 42.22 kg/m   Physical Exam Vitals signs and nursing note reviewed.  Constitutional:      Appearance: He is well-developed. He is obese. He is ill-appearing.  HENT:     Head: Normocephalic and atraumatic.  Eyes:     Conjunctiva/sclera: Conjunctivae normal.  Neck:     Musculoskeletal: Neck supple.  Cardiovascular:     Rate and Rhythm: Regular rhythm. Tachycardia present.     Heart sounds: No murmur.   Pulmonary:     Effort: Pulmonary effort is normal. Tachypnea present. No respiratory distress.     Breath sounds: Normal breath sounds. No decreased breath sounds.  Chest:     Chest wall: No tenderness.  Abdominal:     Palpations: Abdomen is soft.     Tenderness: There is abdominal tenderness (Midline lower abd).  Genitourinary:    Rectum: Guaiac result positive.  Musculoskeletal:     Right lower leg: He exhibits no tenderness. No edema.     Left lower leg: He exhibits no tenderness. No edema.  Skin:    General: Skin is warm and dry.  Neurological:  General: No focal deficit present.     Mental Status: He is alert.     Cranial Nerves: No cranial nerve deficit.  Psychiatric:        Mood and Affect: Mood normal.        Behavior: Behavior normal.      ED Treatments / Results  Labs (all labs ordered are listed, but only abnormal results are displayed) Labs Reviewed  BASIC METABOLIC PANEL - Abnormal; Notable for the following components:      Result Value   Glucose, Bld 156 (*)    BUN 34 (*)    Calcium 8.5 (*)    All other components within normal limits  CBC - Abnormal; Notable for the following components:   WBC 19.1 (*)    RBC 2.51 (*)    Hemoglobin 7.8 (*)    HCT 24.6 (*)    nRBC 0.6 (*)    All other components within normal limits  LACTIC ACID, PLASMA - Abnormal; Notable for the following components:   Lactic Acid, Venous 2.0 (*)    All other components within normal limits  OCCULT BLOOD, POC DEVICE - Abnormal; Notable for the following components:   Fecal Occult Bld POSITIVE (*)    All other components within normal limits  SARS CORONAVIRUS 2 (HOSPITAL ORDER, [place] [facility])  CULTURE, BLOOD (ROUTINE X 2)  CULTURE, BLOOD (ROUTINE X 2)  PROTIME-INR  HEPATIC FUNCTION PANEL  BRAIN NATRIURETIC PEPTIDE  LACTIC ACID, PLASMA  POC OCCULT BLOOD, ED  TYPE AND SCREEN  PREPARE RBC (CROSSMATCH)  ABO/RH  PREPARE RBC (CROSSMATCH)    EKG None   Radiology Dg Chest 2 View  Result Date: 07/03/2019 CLINICAL DATA:  Dyspnea, COPD EXAM: CHEST - 2 VIEW COMPARISON:  02/10/2014 chest radiograph. FINDINGS: Stable cardiomediastinal silhouette with normal heart size. No pneumothorax. No pleural effusion. Hyperinflated lungs. No pulmonary edema. No acute consolidative airspace disease. IMPRESSION: 1. Hyperinflated lungs, compatible with the reported history of COPD. 2. No acute cardiopulmonary disease. Electronically Signed   By: Ilona Sorrel M.D.   On: 07/03/2019 12:32    Procedures .Critical Care Performed by: Lorin Glass, PA-C Authorized by: Lorin Glass, PA-C   Critical care provider statement:    Critical care time (minutes):  45   Critical care time was exclusive of:  Separately billable procedures and treating other patients and teaching time   Critical care was necessary to treat or prevent imminent or life-threatening deterioration of the following conditions:  Shock   Critical care was time spent personally by me on the following activities:  Discussions with consultants, evaluation of patient's response to treatment, examination of patient, ordering and performing treatments and interventions, ordering and review of laboratory studies, ordering and review of radiographic studies, pulse oximetry, re-evaluation of patient's condition, obtaining history from patient or surrogate and review of old charts   (including critical care time)  Medications Ordered in ED Medications  pantoprazole (PROTONIX) 80 mg in sodium chloride 0.9 % 250 mL (0.32 mg/mL) infusion (8 mg/hr Intravenous New Bag/Given 07/03/19 1546)  0.9 %  sodium chloride infusion ( Intravenous New Bag/Given 07/03/19 1515)  0.9 %  sodium chloride infusion (has no administration in time range)  0.9 %  sodium chloride infusion (Manually program via Guardrails IV Fluids) (has no administration in time range)  pantoprazole (PROTONIX) 80 mg in sodium chloride 0.9 %  100 mL IVPB (0 mg Intravenous Stopped 07/03/19 1545)  sodium chloride 0.9 % bolus 500  mL (0 mLs Intravenous Stopped 07/03/19 1545)  1 unit PRBC ordered   Initial Impression / Assessment and Plan / ED Course  I have reviewed the triage vital signs and the nursing notes.  Pertinent labs & imaging results that were available during my care of the patient were reviewed by me and considered in my medical decision making (see chart for details).  Clinical Course as of Jul 03 1631  Sat Jul 03, 2019  1451 Spoke with Dr. Oneida Alar who states that they will see patient in consult, request hospitalist admission.  Is okay with transfusing 1 unit.    [EH]  [id number] Spoke with hospitalist who will admit patient.   [EH]    Clinical Course User Index [EH] Lorin Glass, PA-C      Presents today for evaluation of shortness of breath and abdominal pain.  On exam he is frank melena.  He has been having rectal bleeding over the past 4 days with midline lower abdominal pain.  With this his shortness of breath has been getting worse.  He is normally on 2.5 L of oxygen at baseline and continues to need that.  On arrival he was initially tachycardic and tachypneic therefore with concern of abdominal pain and possibly infection lactic acid and blood cultures were ordered.    His hemoglobin came back at 7.6 which is a significant change from his baseline of 13.1.  I suspect that with this based on his COPD and general condition that his shortness of breath is related to the hemoglobin.  1 unit PRBCs ordered.  His labs show an elevated BUN consistent with upper GI bleed.  His creatinine is not elevated.  His white count is slightly higher at 19.1.  PT/INR is not elevated.  LFTs are normal.  I spoke with GI Dr. Oneida Alar who will see the patient in consult, request hospitalist admission, stated that patient should only need one unit of blood to start.    Patient was started on IV protonix.    This patient was discussed  with Dr. Roderic Palau.    Based on shortness of breath covid test was ordered prior to admission.  His chest x-ray did not show evidence of consolidation or other abnormalities, does show lung changes consistent with chronic COPD without acute findings, therefore I suspect his shortness of breath is related to his anemia.   Patient remained hemodynamically stable while in my care.   Final Clinical Impressions(s) / ED Diagnoses   Final diagnoses:  Acute upper GI bleed  Anemia, unspecified type  Shortness of breath    ED Discharge Orders    None       Ollen Gross 07/03/19 1637    Milton Ferguson, MD 07/05/19 1622

## 2019-07-03 NOTE — H&P (Signed)
History and Physical  Stephen Warren [id number] DOB: 10-13-1956 DOA: 07/03/2019  Referring physician: Wyn Quaker, PA-C, ED provider PCP: [name], MD  Outpatient Specialists:   Patient Coming From: home  Chief Complaint: SOB,   HPI: Stephen Warren is a 63 y.o. male with a history of asthma with COPD, Depression/anxiety, knee pain. Patient seen for 5 days of melena that has become increasingly worse. No palliating or provoking factors. Having SOB that is worse with activity. Felt like he was going to pass out today.  Emergency Department Course: Hg 7.3. Melena observed in ED.  Review of Systems:   Pt denies any fevers, chills, nausea, vomiting, diarrhea, constipation, abdominal pain, dyspnea on exertion, orthopnea, cough, wheezing, palpitations, headache, vision changes, lightheadedness, dizziness, rectal bleeding.  Review of systems are otherwise negative  Past Medical History:  Diagnosis Date  . ADD (attention deficit disorder)   . Arthritis   . Asthma   . Back pain   . COPD (chronic obstructive pulmonary disease) ([place])   . Depression   . 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 Hemorrhoids   . Insomnia   . Knee pain   . Panic attacks    Past Surgical History:  Procedure Laterality Date  . BIOPSY  10/30/2015   Procedure: BIOPSY;  Surgeon: Daneil Dolin, MD;  [facility];  Service: Endoscopy;;  ileocecal valve ulcer  . COLONOSCOPY WITH PROPOFOL N/A 10/30/2015   Procedure: COLONOSCOPY WITH PROPOFOL;  Surgeon: Daneil Dolin, MD;  [facility];  Service: Endoscopy;  Laterality: N/A;  1000   . NASAL SEPTOPLASTY W/ TURBINOPLASTY  07/01/2018  . NASAL SEPTOPLASTY W/ TURBINOPLASTY Bilateral 07/01/2018   Procedure: NASAL SEPTOPLASTY WITH BILATERAL TURBINATE REDUCTION;  Surgeon: Leta Baptist, MD;  Location: [facility];  Service: ENT;  Laterality: Bilateral;  . None to date  10/16/15  . POLYPECTOMY  10/30/2015   Procedure: POLYPECTOMY;  Surgeon: Daneil Dolin, MD;  [facility];  Service: Endoscopy;;  sigmoid polypectomy  x3   Social History:  reports that he quit smoking about 18 months ago. His smoking use included cigarettes. He has a 45.00 pack-year smoking history. He has never used smokeless tobacco. He reports current alcohol use. He reports that he does not use drugs. Patient lives at home  Allergies  Allergen Reactions  . Other Anaphylaxis    Caterpillar   . Iodine     Not sure of reaction  . Shellfish Allergy Swelling    Family History  Problem Relation Age of Onset  . Colon cancer Neg Hx       Prior to Admission medications   Medication Sig Start Date End Date Taking? Authorizing Provider  albuterol (PROVENTIL) (2.5 MG/3ML) 0.083% nebulizer solution Take 2.5 mg by nebulization every 6 (six) hours as needed for wheezing or shortness of breath.   Yes [provider]  baclofen (LIORESAL) 20 MG tablet Take 20 mg by mouth 3 (three) times daily. 06/10/18  Yes [provider]  citalopram (CELEXA) 20 MG tablet Take 20 mg by mouth daily.   Yes [provider]  Multiple Vitamin (MULTIVITAMIN WITH MINERALS) TABS tablet Take 1 tablet by mouth daily.    Yes [provider]  PROAIR HFA 108 (90 Base) MCG/ACT inhaler Inhale 1 puff into the lungs 4 (four) times daily as needed for wheezing or shortness of breath. 06/10/18  Yes [provider]  SYMBICORT 160-4.5 MCG/ACT inhaler Inhale  1 puff into the lungs 2 (two) times daily. 06/11/18  Yes [provider]  tiotropium (SPIRIVA) 18 MCG inhalation capsule Place 18 mcg into inhaler and inhale daily.   Yes [provider]  traZODone (DESYREL) 100 MG tablet Take 200 mg by mouth as needed.  03/30/18  Yes [provider]  hydrocortisone cream 1 % Apply 1 application topically 2 (two) times daily as needed for itching.    [provider]  naproxen (NAPROSYN) 500 MG tablet  Take 500 mg by mouth 2 (two) times daily as needed. for pain 06/24/19   [provider]    Physical Exam: BP (!) 141/59   Pulse 98   Temp 98.2 F (36.8 C) (Rectal)   Resp 20   Ht 6\' 1"  (1.854 m)   Wt (!) 145.2 kg   SpO2 100%   BMI 42.22 kg/m   . General: Elderly male. Awake and alert and oriented x3. No acute cardiopulmonary distress.  Marland Kitchen HEENT: Normocephalic atraumatic.  Right and left ears normal in appearance.  Pupils equal, round, reactive to light. Extraocular muscles are intact. Sclerae anicteric and noninjected.  Moist mucosal membranes. No mucosal lesions.  . Neck: Neck supple without lymphadenopathy. No carotid bruits. No masses palpated.  . Cardiovascular: Regular rate with normal S1-S2 sounds. No murmurs, rubs, gallops auscultated. No JVD.  Marland Kitchen Respiratory: Good respiratory effort with no wheezes, rales, rhonchi. Lungs clear to auscultation bilaterally.  No accessory muscle use. . Abdomen: Soft, nontender, nondistended. Active bowel sounds. No masses or hepatosplenomegaly  . Skin: No rashes, lesions, or ulcerations.  Dry, warm to touch. 2+ dorsalis pedis and radial pulses. . Musculoskeletal: No calf or leg pain. All major joints not erythematous nontender.  No upper or lower joint deformation.  Good ROM.  No contractures  . Psychiatric: Intact judgment and insight. Pleasant and cooperative. . Neurologic: No focal neurological deficits. Strength is 5/5 and symmetric in upper and lower extremities.  Cranial nerves II through XII are grossly intact.           Labs on Admission: I have personally reviewed following labs and imaging studies  CBC: Recent Labs  Lab 07/03/19 1332  WBC 19.1*  HGB 7.8*  HCT 24.6*  MCV 98.0  PLT [id number]   Basic Metabolic Panel: Recent Labs  Lab 07/03/19 1332  NA 137  K 4.7  CL 105  CO2 25  GLUCOSE 156*  BUN 34*  CREATININE 0.95  CALCIUM 8.5*   GFR: Estimated Creatinine Clearance: 119.3 mL/min (by C-G formula based on SCr of  0.95 mg/dL). Liver Function Tests: Recent Labs  Lab 07/03/19 1332  AST 20  ALT 23  ALKPHOS 58  BILITOT 0.4  PROT 6.5  ALBUMIN 3.6   No results for input(s): LIPASE, AMYLASE in the last 168 hours. No results for input(s): AMMONIA in the last 168 hours. Coagulation Profile: Recent Labs  Lab 07/03/19 1332  INR 1.0   Cardiac Enzymes: No results for input(s): CKTOTAL, CKMB, CKMBINDEX, TROPONINI in the last 168 hours. BNP (last 3 results) No results for input(s): PROBNP in the last 8760 hours. HbA1C: No results for input(s): HGBA1C in the last 72 hours. CBG: No results for input(s): GLUCAP in the last 168 hours. Lipid Profile: No results for input(s): CHOL, HDL, LDLCALC, TRIG, CHOLHDL, LDLDIRECT in the last 72 hours. Thyroid Function Tests: No results for input(s): TSH, T4TOTAL, FREET4, T3FREE, THYROIDAB in the last 72 hours. Anemia Panel: No results for input(s): VITAMINB12, FOLATE, FERRITIN, TIBC,  IRON, RETICCTPCT in the last 72 hours. Urine analysis: No results found for: COLORURINE, APPEARANCEUR, LABSPEC, PHURINE, GLUCOSEU, HGBUR, BILIRUBINUR, KETONESUR, PROTEINUR, UROBILINOGEN, NITRITE, LEUKOCYTESUR Sepsis Labs: @LABRCNTIP (procalcitonin:4,lacticidven:4) ) Recent Results (from the past 240 hour(s))  SARS Coronavirus 2 [facility] order, Performed in [facility] hospital lab) Nasopharyngeal Nasopharyngeal Swab     Status: None   Collection Time: 07/03/19  2:06 PM   Specimen: Nasopharyngeal Swab  Result Value Ref Range Status   SARS Coronavirus 2 NEGATIVE NEGATIVE Final    Comment: (NOTE) If result is NEGATIVE SARS-CoV-2 target nucleic acids are NOT DETECTED. The SARS-CoV-2 RNA is generally detectable in upper and lower  respiratory specimens during the acute phase of infection. The lowest  concentration of SARS-CoV-2 viral copies this assay can detect is 250  copies / mL. A negative result does not preclude SARS-CoV-2 infection  and should not be used as the sole basis  for treatment or other  patient management decisions.  A negative result may occur with  improper specimen collection / handling, submission of specimen other  than nasopharyngeal swab, presence of viral mutation(s) within the  areas targeted by this assay, and inadequate number of viral copies  (<250 copies / mL). A negative result must be combined with clinical  observations, patient history, and epidemiological information. If result is POSITIVE SARS-CoV-2 target nucleic acids are DETECTED. The SARS-CoV-2 RNA is generally detectable in upper and lower  respiratory specimens dur ing the acute phase of infection.  Positive  results are indicative of active infection with SARS-CoV-2.  Clinical  correlation with patient history and other diagnostic information is  necessary to determine patient infection status.  Positive results do  not rule out bacterial infection or co-infection with other viruses. If result is PRESUMPTIVE POSTIVE SARS-CoV-2 nucleic acids MAY BE PRESENT.   A presumptive positive result was obtained on the submitted specimen  and confirmed on repeat testing.  While 2019 novel coronavirus  (SARS-CoV-2) nucleic acids may be present in the submitted sample  additional confirmatory testing may be necessary for epidemiological  and / or clinical management purposes  to differentiate between  SARS-CoV-2 and other Sarbecovirus currently known to infect humans.  If clinically indicated additional testing with an alternate test  methodology [phone]) is advised. The SARS-CoV-2 RNA is generally  detectable in upper and lower respiratory sp ecimens during the acute  phase of infection. The expected result is Negative. Fact Sheet for Patients:  StrictlyIdeas.no Fact Sheet for Healthcare Providers: BankingDealers.co.za This test is not yet approved or cleared by the [place] FDA and has been authorized for detection and/or  diagnosis of SARS-CoV-2 by FDA under an Emergency Use Authorization (EUA).  This EUA will remain in effect (meaning this test can be used) for the duration of the COVID-19 declaration under Section 564(b)(1) of the Act, 21 U.S.C. section 360bbb-3(b)(1), unless the authorization is terminated or revoked sooner. Performed at [facility], [address] [place], [place] [address]      Radiological Exams on Admission: Dg Chest 2 View  Result Date: 07/03/2019 CLINICAL DATA:  Dyspnea, COPD EXAM: CHEST - 2 VIEW COMPARISON:  02/10/2014 chest radiograph. FINDINGS: Stable cardiomediastinal silhouette with normal heart size. No pneumothorax. No pleural effusion. Hyperinflated lungs. No pulmonary edema. No acute consolidative airspace disease. IMPRESSION: 1. Hyperinflated lungs, compatible with the reported history of COPD. 2. No acute cardiopulmonary disease. Electronically Signed   By: Ilona Sorrel M.D.   On: 07/03/2019 12:32      Assessment/Plan: Principal Problem:  Symptomatic anemia Active Problems:   Upper GI bleed   COPD (chronic obstructive pulmonary disease) ([facility])   Depression    This patient was discussed with the ED physician, including pertinent vitals, physical exam findings, labs, and imaging.  We also discussed care given by the ED provider.  1. Symptomatic anemia a. Admit b. Transfuse 2 units - discussed risks of transfusion reaction, hemolytic syndrome, infectious risks. c. CBC in AM. 2. Upper GI bleed a. D/c NSAIDs b. Protonix drip c. Consult GI 3. COPD a. Continue inhalers 4. Depression/anxiety a. Continue home meds  DVT prophylaxis: SCds Consultants: GI Code Status: full Family Communication: none  Disposition Plan: patient to return home after improvement.   Truett Mainland, DO

## 2019-07-03 NOTE — ED Triage Notes (Signed)
Pt has been having increasing SOB as well as sweating profusely. Has a history of COPD. Is also complaining of having black stools. Hasn't had a normal BM in 4 days. Pt is also feeling faint.

## 2019-07-04 ENCOUNTER — Inpatient Hospital Stay (HOSPITAL_COMMUNITY): Payer: Medicaid Other | Admitting: Anesthesiology

## 2019-07-04 ENCOUNTER — Encounter (HOSPITAL_COMMUNITY)
Admission: EM | Disposition: A | Discharge status: Home / Self Care | Payer: Self-pay | Source: Home / Self Care | Attending: Internal Medicine

## 2019-07-04 ENCOUNTER — Encounter (HOSPITAL_COMMUNITY): Payer: Self-pay | Admitting: Gastroenterology

## 2019-07-04 DIAGNOSIS — J42 Unspecified chronic bronchitis: Secondary | ICD-10-CM

## 2019-07-04 DIAGNOSIS — D62 Acute posthemorrhagic anemia: Secondary | ICD-10-CM

## 2019-07-04 DIAGNOSIS — K2970 Gastritis, unspecified, without bleeding: Secondary | ICD-10-CM

## 2019-07-04 DIAGNOSIS — K921 Melena: Secondary | ICD-10-CM

## 2019-07-04 DIAGNOSIS — K264 Chronic or unspecified duodenal ulcer with hemorrhage: Principal | ICD-10-CM

## 2019-07-04 DIAGNOSIS — F3289 Other specified depressive episodes: Secondary | ICD-10-CM

## 2019-07-04 HISTORY — PX: BIOPSY: SHX5522

## 2019-07-04 HISTORY — PX: ESOPHAGOGASTRODUODENOSCOPY (EGD) WITH PROPOFOL: SHX5813

## 2019-07-04 LAB — CBC
HCT: 25.1 % — ABNORMAL LOW (ref 39.0–52.0)
Hemoglobin: 8 g/dL — ABNORMAL LOW (ref 13.0–17.0)
MCH: 31.1 pg (ref 26.0–34.0)
MCHC: 31.9 g/dL (ref 30.0–36.0)
MCV: 97.7 fL (ref 80.0–100.0)
Platelets: 200 10*3/uL (ref 150–400)
RBC: 2.57 MIL/uL — ABNORMAL LOW (ref 4.22–5.81)
RDW: 14.5 % (ref 11.5–15.5)
WBC: 12.9 10*3/uL — ABNORMAL HIGH (ref 4.0–10.5)
nRBC: 0.5 % — ABNORMAL HIGH (ref 0.0–0.2)

## 2019-07-04 LAB — BASIC METABOLIC PANEL
Anion gap: 6 (ref 5–15)
BUN: 24 mg/dL — ABNORMAL HIGH (ref 8–23)
CO2: 26 mmol/L (ref 22–32)
Calcium: 7.9 mg/dL — ABNORMAL LOW (ref 8.9–10.3)
Chloride: 108 mmol/L (ref 98–111)
Creatinine, Ser: 0.85 mg/dL (ref 0.61–1.24)
GFR calc Af Amer: >60 mL/min (ref >60)
GFR calc non Af Amer: >60 mL/min (ref >60)
Glucose, Bld: 139 mg/dL — ABNORMAL HIGH (ref 70–99)
Potassium: 3.8 mmol/L (ref 3.5–5.1)
Sodium: 140 mmol/L (ref 135–145)

## 2019-07-04 SURGERY — ESOPHAGOGASTRODUODENOSCOPY (EGD) WITH PROPOFOL
Anesthesia: General

## 2019-07-04 MED ORDER — SODIUM CHLORIDE 0.9 % IV SOLN
INTRAVENOUS | Status: DC
  Administered 2019-07-04 – 2019-07-05 (×2): via INTRAVENOUS

## 2019-07-04 MED ORDER — PROPOFOL 10 MG/ML IV BOLUS
INTRAVENOUS | Status: AC
  Filled 2019-07-04: qty 40

## 2019-07-04 MED ORDER — KETAMINE HCL 50 MG/5ML IJ SOSY
PREFILLED_SYRINGE | INTRAMUSCULAR | Status: AC
  Filled 2019-07-04: qty 5

## 2019-07-04 MED ORDER — KETAMINE HCL 50 MG/ML IJ SOLN
INTRAMUSCULAR | Status: DC | PRN
  Administered 2019-07-04: 25 mg via INTRAMUSCULAR

## 2019-07-04 MED ORDER — HYDROCODONE-ACETAMINOPHEN 7.5-325 MG PO TABS: 1.0000 | ORAL_TABLET | Freq: Once | ORAL | Status: DC | PRN

## 2019-07-04 MED ORDER — PROPOFOL 10 MG/ML IV BOLUS
INTRAVENOUS | Status: DC | PRN
  Administered 2019-07-04: 200 mg via INTRAVENOUS

## 2019-07-04 MED ORDER — SODIUM CHLORIDE (PF) 0.9 % IJ SOLN
PREFILLED_SYRINGE | INTRAMUSCULAR | Status: DC | PRN
  Administered 2019-07-04: 3 mL

## 2019-07-04 MED ORDER — PROMETHAZINE HCL 25 MG/ML IJ SOLN: 6.2500 mg | INTRAMUSCULAR | Status: DC | PRN

## 2019-07-04 MED ORDER — MIDAZOLAM HCL 2 MG/2ML IJ SOLN: 0.5000 mg | Freq: Once | INTRAMUSCULAR | Status: DC | PRN

## 2019-07-04 MED ORDER — HYDROMORPHONE HCL 1 MG/ML IJ SOLN: 0.2500 mg | INTRAMUSCULAR | Status: DC | PRN

## 2019-07-04 MED ORDER — LACTATED RINGERS IV SOLN
INTRAVENOUS | Status: DC
  Administered 2019-07-04: 12:00:00 1000 mL via INTRAVENOUS

## 2019-07-04 NOTE — Interval H&P Note (Signed)
History and Physical Interval Note:  07/04/2019 11:48 AM  Stephen Warren  has presented today for surgery, with the diagnosis of MELENA.  The various methods of treatment have been discussed with the patient and family. After consideration of risks, benefits and other options for treatment, the patient has consented to  Procedure(s): ESOPHAGOGASTRODUODENOSCOPY (EGD) WITH PROPOFOL (N/A) as a surgical intervention.  The patient's history has been reviewed, patient examined, no change in status, stable for surgery.  I have reviewed the patient's chart and labs.  Questions were answered to the patient's satisfaction.     [employer]

## 2019-07-04 NOTE — Anesthesia Postprocedure Evaluation (Signed)
Anesthesia Post Note  Patient: Stephen Warren  Procedure(s) Performed: ESOPHAGOGASTRODUODENOSCOPY (EGD) WITH PROPOFOL (N/A ) BIOPSY  Patient location during evaluation: PACU Anesthesia Type: General Level of consciousness: awake and awake and alert Pain management: pain level controlled Vital Signs Assessment: post-procedure vital signs reviewed and stable Respiratory status: spontaneous breathing, nonlabored ventilation, patient connected to nasal cannula oxygen and respiratory function stable Cardiovascular status: blood pressure returned to baseline and stable Postop Assessment: no headache     Last Vitals:  Vitals:   07/04/19 1128 07/04/19 1215  BP: 115/64 137/76  Pulse: 82 87  Resp: 15 (!) 22  Temp: 36.9 C (P) 36.7 C  SpO2: 95% 98%    Last Pain:  Vitals:   07/04/19 1146  TempSrc:   PainSc: 0-No pain                 [place] Yoshiye Kraft

## 2019-07-04 NOTE — Progress Notes (Addendum)
PROGRESS NOTE    Stephen Warren  [id number] DOB: 1956-04-29 DOA: 07/03/2019 PCP: [name], MD    Brief Narrative:  63 year old male with history of asthma/COPD, depression, anxiety and morbid obesity; who presented to the emergency department after 5 days of melena that has become increasingly worse.  Patient reports an intermittent episode of shortness of breath with activity and feeling lightheaded.  Positive fecal occult blood test with melanotic appearance in the ED; hemoglobin 7.3.   Assessment & Plan: 1-Symptomatic anemia/acute blood loss anemia: Due to upper GI bleed in the setting of duodenal bulb ulcer and gastritis. -Continue to follow hemoglobin trend -Status post 2 unit PRBCs, hemoglobin above 8 -Hemodynamically stable. -Endoscopy demonstrated duodenal bulb ulcer and mild gastritis; status post epinephrine injection for hemostasis. -Continue IV PPI -Advance diet to full liquid and follow GI recommendations. -No heparin products for DVT prophylaxis -Continue to avoid the use of NSAIDs.  2-COPD (chronic obstructive pulmonary disease) (HCC) -No wheezing, no shortness of breath. -Continue home inhalers regimen.  3-Depression/anxiety -No suicidal ideation or hallucinations -Stable mood. -Continue citalopram   4-morbid obesity -Body mass index is 42.81 kg/m. -Low calorie diet, portion control and increase physical activity discussed with patient.  DVT prophylaxis: SCDs Code Status: Full code Family Communication: No family at bedside. Disposition Plan: Remains inpatient, continue IV PPI, advance diet to full liquid, follow GI service recommendations.  Continue to follow hemoglobin trend.  Consultants:   Gastroenterology service  Procedures:   Endoscopy 07/04/2019: Positive duodenal bulb ulcer and mild gastritis; status post epinephrine solution for hemostasis.  Antimicrobials:  Anti-infectives (From admission, onward)   None       Subjective:  Reports on intermittent abdominal discomfort, no chest pain, no nausea, no vomiting, no shortness of breath, he expressed to be hungry.  Objective: Vitals:   07/04/19 0630 07/04/19 0735 07/04/19 1128 07/04/19 1215  BP: 95/72  115/64 137/76  Pulse: 80  82 87  Resp: 20  15 (!) 22  Temp: 99 F (37.2 C)  98.4 F (36.9 C) 98.1 F (36.7 C)  TempSrc: Oral  Oral   SpO2: 98% 100% 95% 98%  Weight:      Height:        Intake/Output Summary (Last 24 hours) at 07/04/2019 1309 Last data filed at 07/04/2019 0630 Gross per 24 hour  Intake 3886.4 ml  Output 900 ml  Net 2986.4 ml   Filed Weights   07/03/19 1142 07/03/19 1818  Weight: (!) 145.2 kg (!) 147.2 kg    Examination: General exam: Alert, awake, oriented x 3; no chest pain, no shortness of breath, no nausea or vomiting.  Patient expressed to be hungry.  Mild midepigastric abdominal discomfort reported. Respiratory system: Clear to auscultation. Respiratory effort normal. Cardiovascular system:RRR. No murmurs, rubs, gallops. Gastrointestinal system: Abdomen is obese, nondistended, soft and nontender on palpation. No organomegaly or masses felt. Normal bowel sounds heard. Central nervous system: Alert and oriented. No focal neurological deficits. Extremities: No cyanosis or clubbing; positive trace edema bilaterally. Skin: No rashes, lesions or ulcers Psychiatry: Judgement and insight appear normal. Mood & affect appropriate.     Data Reviewed: I have personally reviewed following labs and imaging studies  CBC: Recent Labs  Lab 07/03/19 1332 07/04/19 0611  WBC 19.1* 12.9*  HGB 7.8* 8.0*  HCT 24.6* 25.1*  MCV 98.0 97.7  PLT 270 [id number]   Basic Metabolic Panel: Recent Labs  Lab 07/03/19 1332 07/04/19 0611  NA 137 140  K 4.7 3.8  CL  105 108  CO2 25 26  GLUCOSE 156* 139*  BUN 34* 24*  CREATININE 0.95 0.85  CALCIUM 8.5* 7.9*   GFR: Estimated Creatinine Clearance: 134.4 mL/min (by C-G formula based on SCr of 0.85 mg/dL).    Liver Function Tests: Recent Labs  Lab 07/03/19 1332  AST 20  ALT 23  ALKPHOS 58  BILITOT 0.4  PROT 6.5  ALBUMIN 3.6   Coagulation Profile: Recent Labs  Lab 07/03/19 1332  INR 1.0    Recent Results (from the past 240 hour(s))  SARS Coronavirus 2 [facility] order, Performed in [facility] hospital lab) Nasopharyngeal Nasopharyngeal Swab     Status: None   Collection Time: 07/03/19  2:06 PM   Specimen: Nasopharyngeal Swab  Result Value Ref Range Status   SARS Coronavirus 2 NEGATIVE NEGATIVE Final    Comment: (NOTE) If result is NEGATIVE SARS-CoV-2 target nucleic acids are NOT DETECTED. The SARS-CoV-2 RNA is generally detectable in upper and lower  respiratory specimens during the acute phase of infection. The lowest  concentration of SARS-CoV-2 viral copies this assay can detect is 250  copies / mL. A negative result does not preclude SARS-CoV-2 infection  and should not be used as the sole basis for treatment or other  patient management decisions.  A negative result may occur with  improper specimen collection / handling, submission of specimen other  than nasopharyngeal swab, presence of viral mutation(s) within the  areas targeted by this assay, and inadequate number of viral copies  (<250 copies / mL). A negative result must be combined with clinical  observations, patient history, and epidemiological information. If result is POSITIVE SARS-CoV-2 target nucleic acids are DETECTED. The SARS-CoV-2 RNA is generally detectable in upper and lower  respiratory specimens dur ing the acute phase of infection.  Positive  results are indicative of active infection with SARS-CoV-2.  Clinical  correlation with patient history and other diagnostic information is  necessary to determine patient infection status.  Positive results do  not rule out bacterial infection or co-infection with other viruses. If result is PRESUMPTIVE POSTIVE SARS-CoV-2 nucleic acids MAY BE PRESENT.    A presumptive positive result was obtained on the submitted specimen  and confirmed on repeat testing.  While 2019 novel coronavirus  (SARS-CoV-2) nucleic acids may be present in the submitted sample  additional confirmatory testing may be necessary for epidemiological  and / or clinical management purposes  to differentiate between  SARS-CoV-2 and other Sarbecovirus currently known to infect humans.  If clinically indicated additional testing with an alternate test  methodology [phone]) is advised. The SARS-CoV-2 RNA is generally  detectable in upper and lower respiratory sp ecimens during the acute  phase of infection. The expected result is Negative. Fact Sheet for Patients:  StrictlyIdeas.no Fact Sheet for Healthcare Providers: BankingDealers.co.za This test is not yet approved or cleared by the [place] FDA and has been authorized for detection and/or diagnosis of SARS-CoV-2 by FDA under an Emergency Use Authorization (EUA).  This EUA will remain in effect (meaning this test can be used) for the duration of the COVID-19 declaration under Section 564(b)(1) of the Act, 21 U.S.C. section 360bbb-3(b)(1), unless the authorization is terminated or revoked sooner. Performed at [facility], [address] [place], [place] [address]   Culture, blood (routine x 2)     Status: None (Preliminary result)   Collection Time: 07/03/19  3:14 PM   Specimen: BLOOD  Result Value Ref Range Status   Specimen Description BLOOD  Final   Special Requests NONE  Final   Culture   Final    NO GROWTH < 24 HOURS Performed at [facility], [address] [place], [place] [address]    Report Status PENDING  Incomplete  Culture, blood (routine x 2)     Status: None (Preliminary result)   Collection Time: 07/03/19  3:27 PM   Specimen: Left Antecubital; Blood  Result Value Ref Range Status   Specimen Description LEFT ANTECUBITAL  Final   Special  Requests   Final    BOTTLES DRAWN AEROBIC AND ANAEROBIC Blood Culture adequate volume   Culture   Final    NO GROWTH < 24 HOURS Performed at [facility], [address], [place], [place] [address]    Report Status PENDING  Incomplete     Radiology Studies: Dg Chest 2 View  Result Date: 07/03/2019 CLINICAL DATA:  Dyspnea, COPD EXAM: CHEST - 2 VIEW COMPARISON:  02/10/2014 chest radiograph. FINDINGS: Stable cardiomediastinal silhouette with normal heart size. No pneumothorax. No pleural effusion. Hyperinflated lungs. No pulmonary edema. No acute consolidative airspace disease. IMPRESSION: 1. Hyperinflated lungs, compatible with the reported history of COPD. 2. No acute cardiopulmonary disease. Electronically Signed   By: Ilona Sorrel M.D.   On: 07/03/2019 12:32    Scheduled Meds: . baclofen  20 mg Oral TID  . citalopram  20 mg Oral Daily  . mouth rinse  15 mL Mouth Rinse BID  . mometasone-formoterol  2 puff Inhalation BID  . umeclidinium bromide  1 puff Inhalation Daily   Continuous Infusions: . sodium chloride    . pantoprozole (PROTONIX) infusion 8 mg/hr (07/04/19 0818)     LOS: 1 day    Time spent: 30 minutes   Barton Dubois, MD [facility] Pager [phone]   07/04/2019, 1:09 PM

## 2019-07-04 NOTE — Consult Note (Addendum)
Referring Provider: No ref. provider found Primary Care Physician:  [name], MD Primary Gastroenterologist:  DR. Gala Romney  Reason for Consultation:  MELENA   Impression: Admitted with melena. DIFFERENTIAL DIAGNOSIS INCLUDES: PUD, ic valve ulcer, AVMs, DIEULAFOY'S LESION, LESS LIKELY Esophageal varices, GE JUNCTION TUMOR, GASTRIC CA OR MESENTERIC ISCHEMIA.   Plan: 1. EGD W/ PROPOFOL today.  DISCUSSED PROCEDURE, BENEFITS, & RISKS: < 1% chance of medication reaction, bleeding, perforation, or ASPIRATION. 2. PROTONIX GTT 3. NPO UNTIL AFTER EGD.      HPI:  MON HAD BLOODY BM(LOOKED LIKE TAR). HAPPENED 3 TIMES YESTERDAY. PT IS COLOR BLIND. HAD ONE MODERATE SIZE BLACK TARRY STOOL AND TWO SMALL ONES YESTERDAY.FIRST ONE SWEATING PROFUSELY AND FELT LIGHT HEADED  AND LIKE HE WAS GOING TO PASS OUT. VOMITED x1(NO BLOOD). FELT INDIGESTION(BURNING IN BELLY). NO ASPIRIN, BC/GOODY POWDERS, IBUPROFEN/MOTRIN. TAKING NAPROXEN/ALEVE BID WITH FOOD. 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   PT DENIES FEVER, CHILLS, HEMATOCHEZIA, nausea, diarrhea, CHEST PAIN, SHORTNESS OF BREATH, CHANGE IN BOWEL IN HABITS, constipation, problems swallowing, problems with sedation, OR heartburn or indigestion.  Past Medical History:  Diagnosis Date  . ADD (attention deficit disorder)   . Arthritis   . Asthma   . Back pain   . COPD (chronic obstructive pulmonary disease) ([place])   . Depression   . 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 Hemorrhoids   . Insomnia   . Knee pain   . Panic attacks    Past Surgical History:  Procedure Laterality Date  . BIOPSY  10/30/2015   Procedure: BIOPSY;  Surgeon: Daneil Dolin, MD;  [facility];  Service: Endoscopy;;  ileocecal valve ulcer  . COLONOSCOPY WITH PROPOFOL N/A 10/30/2015   Procedure: COLONOSCOPY WITH PROPOFOL;  Surgeon: Daneil Dolin, MD;  Location: [facility];  Service: Endoscopy;  Laterality: N/A;   1000   . NASAL SEPTOPLASTY W/ TURBINOPLASTY  07/01/2018  . NASAL SEPTOPLASTY W/ TURBINOPLASTY Bilateral 07/01/2018   Procedure: NASAL SEPTOPLASTY WITH BILATERAL TURBINATE REDUCTION;  Surgeon: Leta Baptist, MD;  Location: [place];  Service: ENT;  Laterality: Bilateral;  . None to date  10/16/15  . POLYPECTOMY  10/30/2015   Procedure: POLYPECTOMY;  Surgeon: Daneil Dolin, MD;  [facility]: [facility];  Service: Endoscopy;;  sigmoid polypectomy  x3    Prior to Admission medications   Medication Sig Start Date End Date Taking? Authorizing Provider  albuterol (PROVENTIL) (2.5 MG/3ML) 0.083% nebulizer solution Take 2.5 mg by nebulization every 6 (six) hours as needed for wheezing or shortness of breath.   Yes [provider]  baclofen (LIORESAL) 20 MG tablet Take 20 mg by mouth 3 (three) times daily. 06/10/18  Yes [provider]  citalopram (CELEXA) 20 MG tablet Take 20 mg by mouth daily.   Yes [provider]  Multiple Vitamin (MULTIVITAMIN WITH MINERALS) TABS tablet Take 1 tablet by mouth daily.    Yes [provider]  PROAIR HFA 108 (90 Base) MCG/ACT inhaler Inhale 1 puff into the lungs 4 (four) times daily as needed for wheezing or shortness of breath. 06/10/18  Yes [provider]  SYMBICORT 160-4.5 MCG/ACT inhaler Inhale 1 puff into the lungs 2 (two) times daily. 06/11/18  Yes [provider]  tiotropium (SPIRIVA) 18 MCG inhalation capsule Place 18 mcg into inhaler and inhale daily.   Yes [provider]  traZODone (DESYREL) 100 MG tablet Take 200 mg by mouth as needed.  03/30/18  Yes [provider]  hydrocortisone cream 1 % Apply 1 application topically 2 (two) times daily as needed for itching.    [provider]  naproxen (NAPROSYN) 500 MG tablet Take 500 mg by mouth 2 (two) times daily as needed. for pain 06/24/19   [provider]    Current Facility-Administered Medications  Medication Dose Route Frequency  Provider Last Rate Last Dose  . 0.9 %  sodium chloride infusion   Intravenous Continuous Truett Mainland, DO 125 mL/hr at 07/03/19 1848    . albuterol (PROVENTIL) (2.5 MG/3ML) 0.083% nebulizer solution 2.5 mg  2.5 mg Inhalation QID PRN Truett Mainland, DO   2.5 mg at 07/04/19 0735  . baclofen (LIORESAL) tablet 20 mg  20 mg Oral TID Truett Mainland, DO   20 mg at 07/04/19 [id number]  . citalopram (CELEXA) tablet 20 mg  20 mg Oral Daily Truett Mainland, DO   20 mg at 07/04/19 [id number]  . MEDLINE mouth rinse  15 mL Mouth Rinse BID Truett Mainland, DO   15 mL at 07/04/19 [id number]  . mometasone-formoterol (DULERA) 200-5 MCG/ACT inhaler 2 puff  2 puff Inhalation BID Truett Mainland, DO   2 puff at 07/04/19 0741  . pantoprazole (PROTONIX) 80 mg in sodium chloride 0.9 % 250 mL (0.32 mg/mL) infusion  8 mg/hr Intravenous Continuous Truett Mainland, DO 25 mL/hr at 07/04/19 0818 8 mg/hr at 07/04/19 0818  . traZODone (DESYREL) tablet 200 mg  200 mg Oral QHS PRN Truett Mainland, DO      . umeclidinium bromide (INCRUSE ELLIPTA) 62.5 MCG/INH 1 puff  1 puff Inhalation Daily Truett Mainland, DO        Allergies as of 07/03/2019 - Review Complete 07/03/2019  Allergen Reaction Noted  . Other Anaphylaxis 10/25/2015  . Iodine  10/16/2015  . Shellfish allergy Swelling 10/16/2015    Family History  Problem Relation Age of Onset  . Colon cancer Neg Hx    Social History   Socioeconomic History  . Marital status: Single    Spouse name: Not on file  . Number of children: Not on file  . Years of education: Not on file  . Highest education level: Not on file  Occupational History  . Not on file  Social Needs  . Financial resource strain: Not on file  . Food insecurity    Worry: Not on file    Inability: Not on file  . Transportation needs    Medical: Not on file    Non-medical: Not on file  Tobacco Use  . Smoking status: Former Smoker    Packs/day: 1.00    Years: 45.00    Pack years: 45.00    Types:  Cigarettes    Quit date: 12/25/2017    Years since quitting: 1.5  . Smokeless tobacco: Never Used  . Tobacco comment: Quit this year, now on oxygen.   Substance and Sexual Activity  . 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 Drug use: No  . Sexual activity: Not on file  Lifestyle  . Physical activity    Days per week: Not on file    Minutes per session: Not on file  . Stress: Not on file  Relationships  . Social Herbalist on phone: Not on file    Gets together: Not on  file    Attends religious service: Not on file    Active member of club or organization: Not on file    Attends meetings of clubs or organizations: Not on file    Relationship status: Not on file  Other Topics Concern  . Not on file  Social History Narrative   NO WORK IN YEARS DUE TO DISABILITY: BACK, KNEES, COPD/ASTHMA. 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 SPENDS FREE TIME: COMPUTER GAMES.    Review of Systems: PER HPI OTHERWISE ALL SYSTEMS ARE NEGATIVE.   Vitals: Blood pressure 95/72, pulse 80, temperature 99 F (37.2 C), temperature source Oral, resp. rate 20, height 6\' 1"  (1.854 m), weight (!) 147.2 kg, SpO2 100 %.  Physical Exam: General:   Alert,  Well-developed, well-nourished, pleasant and cooperative in NAD Head:  Normocephalic and atraumatic. Eyes:  Sclera clear, no icterus.   Conjunctiva pink. Mouth:  No lesions, dentition abnormal. Neck:  Supple; no masses. Lungs:  Clear throughout to auscultation.   No wheezes. No acute distress. Heart:  Regular rate and rhythm; no murmurs. Abdomen:  Soft, MILDLY tender IN RLQ and nondistended. No masses noted. Normal bowel sounds, without guarding, and without rebound.  Obese. Msk:  Symmetrical with gross deformities. Normal posture. Extremities:  Without edema. Neurologic:  Alert and  oriented x4;  NO  NEW FOCAL DEFICITS Psych:  Alert and  cooperative. Normal mood and affect.   Lab Results: Recent Labs    07/03/19 1332 07/04/19 0611  WBC 19.1* 12.9*  HGB 7.8* 8.0*  HCT 24.6* 25.1*  PLT 270 200   BMET Recent Labs    07/03/19 1332 07/04/19 0611  NA 137 140  K 4.7 3.8  CL 105 108  CO2 25 26  GLUCOSE 156* 139*  BUN 34* 24*  CREATININE 0.95 0.85  CALCIUM 8.5* 7.9*   LFT Recent Labs    07/03/19 1332  PROT 6.5  ALBUMIN 3.6  AST 20  ALT 23  ALKPHOS 58  BILITOT 0.4  BILIDIR 0.1  IBILI 0.3     Studies/Results: CXR: AUG 22-COPD, NO ACUTE DISEASE   LOS: 1 day      07/04/2019, 8:32 AM

## 2019-07-04 NOTE — Progress Notes (Signed)
Notified MD of patient blood pressure before blood administration.  Received okay to give blood to patient.  Patient is alert responsive, oriented. Has no complaints at this time and is resting comfortably.

## 2019-07-04 NOTE — Transfer of Care (Signed)
Immediate Anesthesia Transfer of Care Note  Patient: Stephen Warren  Procedure(s) Performed: ESOPHAGOGASTRODUODENOSCOPY (EGD) WITH PROPOFOL (N/A ) BIOPSY  Patient Location: PACU  Anesthesia Type:General  Level of Consciousness: awake and alert   Airway & Oxygen Therapy: Patient Spontanous Breathing and Patient connected to nasal cannula oxygen  Post-op Assessment: Report given to RN and Post -op Vital signs reviewed and stable  Post vital signs: Reviewed and stable  Last Vitals:  Vitals Value Taken Time  BP 137/76 07/04/19 1215  Temp    Pulse 87 07/04/19 1217  Resp 14 07/04/19 1216  SpO2 97 % 07/04/19 1217  Vitals shown include unvalidated device data.  Last Pain:  Vitals:   07/04/19 1146  TempSrc:   PainSc: 0-No pain         Complications: No apparent anesthesia complications

## 2019-07-04 NOTE — Op Note (Signed)
[facility] Patient Name: Stephen Warren Procedure Date: 07/04/2019 11:38 AM MRN: [id number] Date of Birth: September 30, 1956 Attending MD: Barney Drain MD, MD CSN: [id number] Age: 63 Admit Type: Outpatient Procedure:                Upper GI endoscopy-COLD FORCEPS BIOPSY/SUBMUCOSAL                            INJECTION/CONTROL BLEEDING Indications:              Melena WHILE TAKING NAPROXEN BID AND NO PPI Providers:                Barney Drain MD, MD, Otis Peak B. Sharon Seller, RN, Lurline Del, RN Referring MD:             [name] MD, MD Medicines:                Propofol per Anesthesia Complications:            No immediate complications. Estimated Blood Loss:     Estimated blood loss was minimal. Procedure:                Pre-Anesthesia Assessment:                           - Prior to the procedure, a History and Physical                            was performed, and patient medications and                            allergies were reviewed. The patient's tolerance of                            previous anesthesia was also reviewed. The risks                            and benefits of the procedure and the sedation                            options and risks were discussed with the patient.                            All questions were answered, and informed consent                            was obtained. Prior Anticoagulants: The patient has                            taken no previous anticoagulant or antiplatelet                            agents except for NSAID medication. ASA Grade  Assessment: II - A patient with mild systemic                            disease. After reviewing the risks and benefits,                            the patient was deemed in satisfactory condition to                            undergo the procedure. After obtaining informed                            consent, the endoscope was passed under direct                          vision. Throughout the procedure, the patient's                            blood pressure, pulse, and oxygen saturations were                            monitored continuously. The [id number]                            scope was introduced through the mouth, and                            advanced to the second part of duodenum. The upper                            GI endoscopy was accomplished without difficulty.                            The patient tolerated the procedure well. Scope In: 11:51:58 AM Scope Out: 12:05:52 PM Total Procedure Duration: 0 hours 13 minutes 54 seconds  Findings:      The examined esophagus was normal.      A small hiatal hernia was present.      Localized mild inflammation characterized by congestion (edema) and       erythema was found in the gastric antrum. Biopsies were taken with a       cold forceps for Helicobacter pylori testing.      One oozing cratered duodenal ulcer with a visible vessel was found in       the duodenal bulb. Area was successfully injected with 3 mL of a       1:10,000 solution of epinephrine for hemostasis. Coagulation(20W) for       hemostasis using bipolar probe was successful. Impression:               - Small hiatal hernia.                           - MILD Gastritis.                           - MELENA DUE TO duodenal  BULB ulcer Moderate Sedation:      Per Anesthesia Care Recommendation:           - Return patient to hospital ward for ongoing care.                           - Full liquid diet.                           - Continue present medications. PROTONIX GTT FOR 72                            HRS(LAST DOSE AUG 26 @12N )                           - Await pathology results. Procedure Code(s):        --- Professional ---                           [phone], 29, Esophagogastroduodenoscopy, flexible,                            transoral; with control of bleeding, any method                            [id number], Esophagogastroduodenoscopy, flexible,                            transoral; with biopsy, single or multiple Diagnosis Code(s):        --- Professional ---                           K44.9, Diaphragmatic hernia without obstruction or                            gangrene                           K29.70, Gastritis, unspecified, without bleeding                           K26.4, Chronic or unspecified duodenal ulcer with                            hemorrhage                           K92.1, Melena (includes Hematochezia) CPT copyright 2019 American Medical Association. All rights reserved. The codes documented in this report are preliminary and upon coder review may  be revised to meet current compliance requirements. Barney Drain, MD Barney Drain MD, MD 07/04/2019 12:18:44 PM This report has been signed electronically. Number of Addenda: 0

## 2019-07-04 NOTE — H&P (View-Only) (Signed)
Referring Provider: No ref. provider found Primary Care Physician:  [name], MD Primary Gastroenterologist:  DR. Gala Romney  Reason for Consultation:  MELENA   Impression: Admitted with melena. DIFFERENTIAL DIAGNOSIS INCLUDES: PUD, ic valve ulcer, AVMs, DIEULAFOY'S LESION, LESS LIKELY Esophageal varices, GE JUNCTION TUMOR, GASTRIC CA OR MESENTERIC ISCHEMIA.   Plan: 1. EGD W/ PROPOFOL today.  DISCUSSED PROCEDURE, BENEFITS, & RISKS: < 1% chance of medication reaction, bleeding, perforation, or ASPIRATION. 2. PROTONIX GTT 3. NPO UNTIL AFTER EGD.      HPI:  MON HAD BLOODY BM(LOOKED LIKE TAR). HAPPENED 3 TIMES YESTERDAY. PT IS COLOR BLIND. HAD ONE MODERATE SIZE BLACK TARRY STOOL AND TWO SMALL ONES YESTERDAY.FIRST ONE SWEATING PROFUSELY AND FELT LIGHT HEADED  AND LIKE HE WAS GOING TO PASS OUT. VOMITED x1(NO BLOOD). FELT INDIGESTION(BURNING IN BELLY). NO ASPIRIN, BC/GOODY POWDERS, IBUPROFEN/MOTRIN. TAKING NAPROXEN/ALEVE BID WITH FOOD. 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   PT DENIES FEVER, CHILLS, HEMATOCHEZIA, nausea, diarrhea, CHEST PAIN, SHORTNESS OF BREATH, CHANGE IN BOWEL IN HABITS, constipation, problems swallowing, problems with sedation, OR heartburn or indigestion.  Past Medical History:  Diagnosis Date  . ADD (attention deficit disorder)   . Arthritis   . Asthma   . Back pain   . COPD (chronic obstructive pulmonary disease) ([place])   . Depression   . 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 Hemorrhoids   . Insomnia   . Knee pain   . Panic attacks    Past Surgical History:  Procedure Laterality Date  . BIOPSY  10/30/2015   Procedure: BIOPSY;  Surgeon: Daneil Dolin, MD;  Location: [facility];  Service: Endoscopy;;  ileocecal valve ulcer  . COLONOSCOPY WITH PROPOFOL N/A 10/30/2015   Procedure: COLONOSCOPY WITH PROPOFOL;  Surgeon: Daneil Dolin, MD;  Location: [facility];  Service: Endoscopy;  Laterality: N/A;   1000   . NASAL SEPTOPLASTY W/ TURBINOPLASTY  07/01/2018  . NASAL SEPTOPLASTY W/ TURBINOPLASTY Bilateral 07/01/2018   Procedure: NASAL SEPTOPLASTY WITH BILATERAL TURBINATE REDUCTION;  Surgeon: Leta Baptist, MD;  Location: [place];  Service: ENT;  Laterality: Bilateral;  . None to date  10/16/15  . POLYPECTOMY  10/30/2015   Procedure: POLYPECTOMY;  Surgeon: Daneil Dolin, MD;  Location: [facility];  Service: Endoscopy;;  sigmoid polypectomy  x3    Prior to Admission medications   Medication Sig Start Date End Date Taking? Authorizing Provider  albuterol (PROVENTIL) (2.5 MG/3ML) 0.083% nebulizer solution Take 2.5 mg by nebulization every 6 (six) hours as needed for wheezing or shortness of breath.   Yes [provider]  baclofen (LIORESAL) 20 MG tablet Take 20 mg by mouth 3 (three) times daily. 06/10/18  Yes [provider]  citalopram (CELEXA) 20 MG tablet Take 20 mg by mouth daily.   Yes [provider]  Multiple Vitamin (MULTIVITAMIN WITH MINERALS) TABS tablet Take 1 tablet by mouth daily.    Yes [provider]  PROAIR HFA 108 (90 Base) MCG/ACT inhaler Inhale 1 puff into the lungs 4 (four) times daily as needed for wheezing or shortness of breath. 06/10/18  Yes [provider]  SYMBICORT 160-4.5 MCG/ACT inhaler Inhale 1 puff into the lungs 2 (two) times daily. 06/11/18  Yes [provider]  tiotropium (SPIRIVA) 18 MCG inhalation capsule Place 18 mcg into inhaler and inhale daily.   Yes [provider]  traZODone (DESYREL) 100 MG tablet Take 200 mg by mouth as needed.  03/30/18  Yes [provider]  hydrocortisone cream 1 % Apply 1 application topically 2 (two) times daily as needed for itching.    [provider]  naproxen (NAPROSYN) 500 MG tablet Take 500 mg by mouth 2 (two) times daily as needed. for pain 06/24/19   [provider]    Current Facility-Administered Medications  Medication Dose Route Frequency  Provider Last Rate Last Dose  . 0.9 %  sodium chloride infusion   Intravenous Continuous Truett Mainland, DO 125 mL/hr at 07/03/19 1848    . albuterol (PROVENTIL) (2.5 MG/3ML) 0.083% nebulizer solution 2.5 mg  2.5 mg Inhalation QID PRN Truett Mainland, DO   2.5 mg at 07/04/19 0735  . baclofen (LIORESAL) tablet 20 mg  20 mg Oral TID Truett Mainland, DO   20 mg at 07/04/19 [id number]  . citalopram (CELEXA) tablet 20 mg  20 mg Oral Daily Truett Mainland, DO   20 mg at 07/04/19 [id number]  . MEDLINE mouth rinse  15 mL Mouth Rinse BID Truett Mainland, DO   15 mL at 07/04/19 [id number]  . mometasone-formoterol (DULERA) 200-5 MCG/ACT inhaler 2 puff  2 puff Inhalation BID Truett Mainland, DO   2 puff at 07/04/19 0741  . pantoprazole (PROTONIX) 80 mg in sodium chloride 0.9 % 250 mL (0.32 mg/mL) infusion  8 mg/hr Intravenous Continuous Truett Mainland, DO 25 mL/hr at 07/04/19 0818 8 mg/hr at 07/04/19 0818  . traZODone (DESYREL) tablet 200 mg  200 mg Oral QHS PRN Truett Mainland, DO      . umeclidinium bromide (INCRUSE ELLIPTA) 62.5 MCG/INH 1 puff  1 puff Inhalation Daily Truett Mainland, DO        Allergies as of 07/03/2019 - Review Complete 07/03/2019  Allergen Reaction Noted  . Other Anaphylaxis 10/25/2015  . Iodine  10/16/2015  . Shellfish allergy Swelling 10/16/2015    Family History  Problem Relation Age of Onset  . Colon cancer Neg Hx    Social History   Socioeconomic History  . Marital status: Single    Spouse name: Not on file  . Number of children: Not on file  . Years of education: Not on file  . Highest education level: Not on file  Occupational History  . Not on file  Social Needs  . Financial resource strain: Not on file  . Food insecurity    Worry: Not on file    Inability: Not on file  . Transportation needs    Medical: Not on file    Non-medical: Not on file  Tobacco Use  . Smoking status: Former Smoker    Packs/day: 1.00    Years: 45.00    Pack years: 45.00    Types:  Cigarettes    Quit date: 12/25/2017    Years since quitting: 1.5  . Smokeless tobacco: Never Used  . Tobacco comment: Quit this year, now on oxygen.   Substance and Sexual Activity  . 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 Drug use: No  . Sexual activity: Not on file  Lifestyle  . Physical activity    Days per week: Not on file    Minutes per session: Not on file  . Stress: Not on file  Relationships  . Social Herbalist on phone: Not on file    Gets together: Not on  file    Attends religious service: Not on file    Active member of club or organization: Not on file    Attends meetings of clubs or organizations: Not on file    Relationship status: Not on file  Other Topics Concern  . Not on file  Social History Narrative   NO WORK IN YEARS DUE TO DISABILITY: BACK, KNEES, COPD/ASTHMA. 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 SPENDS FREE TIME: COMPUTER GAMES.    Review of Systems: PER HPI OTHERWISE ALL SYSTEMS ARE NEGATIVE.   Vitals: Blood pressure 95/72, pulse 80, temperature 99 F (37.2 C), temperature source Oral, resp. rate 20, height 6\' 1"  (1.854 m), weight (!) 147.2 kg, SpO2 100 %.  Physical Exam: General:   Alert,  Well-developed, well-nourished, pleasant and cooperative in NAD Head:  Normocephalic and atraumatic. Eyes:  Sclera clear, no icterus.   Conjunctiva pink. Mouth:  No lesions, dentition abnormal. Neck:  Supple; no masses. Lungs:  Clear throughout to auscultation.   No wheezes. No acute distress. Heart:  Regular rate and rhythm; no murmurs. Abdomen:  Soft, MILDLY tender IN RLQ and nondistended. No masses noted. Normal bowel sounds, without guarding, and without rebound.  Obese. Msk:  Symmetrical with gross deformities. Normal posture. Extremities:  Without edema. Neurologic:  Alert and  oriented x4;  NO  NEW FOCAL DEFICITS Psych:  Alert and  cooperative. Normal mood and affect.   Lab Results: Recent Labs    07/03/19 1332 07/04/19 0611  WBC 19.1* 12.9*  HGB 7.8* 8.0*  HCT 24.6* 25.1*  PLT 270 200   BMET Recent Labs    07/03/19 1332 07/04/19 0611  NA 137 140  K 4.7 3.8  CL 105 108  CO2 25 26  GLUCOSE 156* 139*  BUN 34* 24*  CREATININE 0.95 0.85  CALCIUM 8.5* 7.9*   LFT Recent Labs    07/03/19 1332  PROT 6.5  ALBUMIN 3.6  AST 20  ALT 23  ALKPHOS 58  BILITOT 0.4  BILIDIR 0.1  IBILI 0.3     Studies/Results: CXR: AUG 22-COPD, NO ACUTE DISEASE   LOS: 1 day   Ollivander See  07/04/2019, 8:32 AM

## 2019-07-04 NOTE — Anesthesia Preprocedure Evaluation (Addendum)
Anesthesia Evaluation  Patient identified by MRN, date of birth, ID band Patient awake    Reviewed: Allergy & Precautions, NPO status , Patient's Chart, lab work & pertinent test results  Airway Mallampati: II  TM Distance: >3 FB Neck ROM: Full    Dental no notable dental hx. (+) Teeth Intact   Pulmonary asthma , sleep apnea and Oxygen sleep apnea , COPD,  COPD inhaler and oxygen dependent, former smoker,    Pulmonary exam normal breath sounds clear to auscultation       Cardiovascular Exercise Tolerance: Good negative cardio ROS Normal cardiovascular examI Rhythm:Regular Rate:Normal     Neuro/Psych Anxiety Depression negative neurological ROS  negative psych ROS   GI/Hepatic negative GI ROS, Neg liver ROS, Here for EGD for anemia    Endo/Other  Morbid obesity  Renal/GU negative Renal ROS  negative genitourinary   Musculoskeletal  (+) Arthritis , Osteoarthritis,    Abdominal   Peds negative pediatric ROS (+)  Hematology negative hematology ROS (+) anemia ,   Anesthesia Other Findings   Reproductive/Obstetrics negative OB ROS                            Anesthesia Physical Anesthesia Plan  ASA: IV and emergent  Anesthesia Plan: General   Post-op Pain Management:    Induction: Intravenous  PONV Risk Score and Plan: 2 and Propofol infusion, TIVA and Treatment may vary due to age or medical condition  Airway Management Planned: Nasal Cannula and Simple Face Mask  Additional Equipment:   Intra-op Plan:   Post-operative Plan:   Informed Consent: I have reviewed the patients History and Physical, chart, labs and discussed the procedure including the risks, benefits and alternatives for the proposed anesthesia with the patient or authorized representative who has indicated his/her understanding and acceptance.     Dental advisory given  Plan Discussed with: CRNA  Anesthesia  Plan Comments: (Plan Full PPE use  Plan Ga with GETA as needed d/w pt -WTP with same after Q&A)       Anesthesia Quick Evaluation

## 2019-07-05 ENCOUNTER — Encounter (HOSPITAL_COMMUNITY): Payer: Self-pay | Admitting: Gastroenterology

## 2019-07-05 DIAGNOSIS — K269 Duodenal ulcer, unspecified as acute or chronic, without hemorrhage or perforation: Secondary | ICD-10-CM

## 2019-07-05 DIAGNOSIS — D649 Anemia, unspecified: Secondary | ICD-10-CM

## 2019-07-05 DIAGNOSIS — K922 Gastrointestinal hemorrhage, unspecified: Secondary | ICD-10-CM

## 2019-07-05 LAB — TYPE AND SCREEN
ABO/RH(D): AB POS
Antibody Screen: NEGATIVE
Unit division: 0
Unit division: 0
Unit division: 0
Unit division: 0

## 2019-07-05 LAB — CBC
HCT: 26.2 % — ABNORMAL LOW (ref 39.0–52.0)
Hemoglobin: 8.2 g/dL — ABNORMAL LOW (ref 13.0–17.0)
MCH: 30.7 pg (ref 26.0–34.0)
MCHC: 31.3 g/dL (ref 30.0–36.0)
MCV: 98.1 fL (ref 80.0–100.0)
Platelets: 221 10*3/uL (ref 150–400)
RBC: 2.67 MIL/uL — ABNORMAL LOW (ref 4.22–5.81)
RDW: 14.8 % (ref 11.5–15.5)
WBC: 11.9 10*3/uL — ABNORMAL HIGH (ref 4.0–10.5)
nRBC: 0.4 % — ABNORMAL HIGH (ref 0.0–0.2)

## 2019-07-05 LAB — BPAM RBC
Blood Product Expiration Date: 202009012359
Blood Product Expiration Date: 202009192359
Blood Product Expiration Date: 202009192359
Blood Product Expiration Date: 202009242359
ISSUE DATE / TIME: 202008222025
ISSUE DATE / TIME: 202008222051
ISSUE DATE / TIME: 202008230216
Unit Type and Rh: 5100
Unit Type and Rh: 6200
Unit Type and Rh: 6200
Unit Type and Rh: 9500

## 2019-07-05 NOTE — Progress Notes (Addendum)
    Subjective: No abdominal pain. No N/V. Tolerating full liquids. No overt GI bleeding. Would like to have [facility] pick him up at discharge, as he has no other transportation. Friend will be working.   Objective: Vital signs in last 24 hours: Temp:  [98.1 F (36.7 C)-98.5 F (36.9 C)] 98.5 F (36.9 C) (08/24 0642) Pulse Rate:  [79-91] 79 (08/24 0642) Resp:  [15-22] 19 (08/24 0642) BP: (90-137)/(56-76) 118/69 (08/24 0642) SpO2:  [95 %-100 %] 98 % (08/24 0721)   General:   Alert and oriented, pleasant  Abdomen:  Bowel sounds present, soft, obese, non-tender, non-distended. Large AP diameter Neurologic:  Alert and  oriented x4;  grossly normal neurologically. Psych:  Normal mood and affect, jovial.   Intake/Output from previous day: 08/23 0701 - 08/24 0700 In: 1007.5 [I.V.:1007.5] Out: 1100 [Urine:1100] Intake/Output this shift: Total I/O In: 240 [P.O.:240] Out: -   Lab Results: Recent Labs    07/03/19 1332 07/04/19 0611 07/05/19 0809  WBC 19.1* 12.9* 11.9*  HGB 7.8* 8.0* 8.2*  HCT 24.6* 25.1* 26.2*  PLT 270 200 221   BMET Recent Labs    07/03/19 1332 07/04/19 0611  NA 137 140  K 4.7 3.8  CL 105 108  CO2 25 26  GLUCOSE 156* 139*  BUN 34* 24*  CREATININE 0.95 0.85  CALCIUM 8.5* 7.9*   LFT Recent Labs    07/03/19 1332  PROT 6.5  ALBUMIN 3.6  AST 20  ALT 23  ALKPHOS 58  BILITOT 0.4  BILIDIR 0.1  IBILI 0.3   PT/INR Recent Labs    07/03/19 1332  LABPROT 13.3  INR 1.0     Studies/Results: Dg Chest 2 View  Result Date: 07/03/2019 CLINICAL DATA:  Dyspnea, COPD EXAM: CHEST - 2 VIEW COMPARISON:  02/10/2014 chest radiograph. FINDINGS: Stable cardiomediastinal silhouette with normal heart size. No pneumothorax. No pleural effusion. Hyperinflated lungs. No pulmonary edema. No acute consolidative airspace disease. IMPRESSION: 1. Hyperinflated lungs, compatible with the reported history of COPD. 2. No acute cardiopulmonary disease. Electronically Signed    By: Ilona Sorrel M.D.   On: 07/03/2019 12:32    Assessment: 63 year old male admitted with acute blood loss anemia and melena due to duodenal ulcer in setting of NSAIDs. EGD yesterday with oozing cratered duodenal ulcer with visible vessel s/p bleeding control therapy. PPI infusion to continue for total of 72 hours, ending 8/26 at 1200. 2 units PRBCs received this admission, with Hgb stable.   Concerned regarding transportation when discharged home, and requesting [facility], as friend will be working. Will need to discuss with nursing staff assistance with this.   Plan: PPI infusion total of 72 hours (time starting post-EGD), ending Wednesday at noon PPI BID after PPI infusion completed Follow-up on pathology Avoid NSAIDs Advance to soft diet Hopeful discharge Wednesday  Annitta Needs, PhD, ANP-BC [facility]     LOS: 2 days    07/05/2019, 9:32 AM

## 2019-07-05 NOTE — Progress Notes (Signed)
PROGRESS NOTE    Stephen Warren  [id number] DOB: 11-Aug-1956 DOA: 07/03/2019 PCP: [name], MD    Brief Narrative:  63 year old male with history of asthma/COPD, depression, anxiety and morbid obesity; who presented to the emergency department after 5 days of melena that has become increasingly worse.  Patient reports an intermittent episode of shortness of breath with activity and feeling lightheaded.  Positive fecal occult blood test with melanotic appearance in the ED; hemoglobin 7.3.   Assessment & Plan: 1-Symptomatic anemia/acute blood loss anemia: Due to upper GI bleed in the setting of duodenal bulb ulcer and gastritis. -Continue to follow hemoglobin trend -Status post 2 unit PRBCs, hemoglobin above 8.2 -Hemodynamically stable. -Endoscopy demonstrated duodenal bulb ulcer and mild gastritis; status post epinephrine injection for hemostasis. -Continue IV PPI (plan is for 72 hours of IV infusion; anticipated in treatment day 07/07/2019 afternoon). -Continue advancing diet as per GI recommendations.   -No heparin products for DVT prophylaxis -Continue to avoid the use of NSAIDs.  2-COPD (chronic obstructive pulmonary disease) (HCC) -No wheezing, no shortness of breath. -Continue home inhalers regimen.  3-Depression/anxiety -No suicidal ideation or hallucinations -Stable mood. -Continue citalopram   4-morbid obesity -Body mass index is 42.81 kg/m. -Low calorie diet, portion control and increase physical activity discussed with patient.  DVT prophylaxis: SCDs Code Status: Full code Family Communication: No family at bedside. Disposition Plan: Remains inpatient, continue IV PPI as recommended by GI with intention to complete 72 hours of IV infusion prior to discharge., advance diet to full liquid, follow GI service recommendations.  Continue to follow hemoglobin trend.  Consultants:   Gastroenterology service  Procedures:   Endoscopy 07/04/2019: Positive duodenal  bulb ulcer and mild gastritis; status post epinephrine solution for hemostasis.  Antimicrobials:  Anti-infectives (From admission, onward)   None      Subjective: No chest pain, no shortness of breath, no nausea, no vomiting, no abdominal pain.  Patient reports feeling good and in no distress.  Objective: Vitals:   07/04/19 1930 07/04/19 2132 07/05/19 0642 07/05/19 0721  BP:  (!) 90/56 118/69   Pulse:  91 79   Resp:  20 19   Temp:  98.3 F (36.8 C) 98.5 F (36.9 C)   TempSrc:  Oral Oral   SpO2: 98% 100% 100% 98%  Weight:      Height:        Intake/Output Summary (Last 24 hours) at 07/05/2019 1434 Last data filed at 07/05/2019 0900 Gross per 24 hour  Intake 1864.88 ml  Output 1100 ml  Net 764.88 ml   Filed Weights   07/03/19 1142 07/03/19 1818  Weight: (!) 145.2 kg (!) 147.2 kg    Examination: General exam: Alert, awake, oriented x 3; no chest pain, no shortness of breath, no nausea, no vomiting.  Patient is tolerating diet and in no acute distress.  Reports no further signs of melanotic stools. Respiratory system: Clear to auscultation. Respiratory effort normal. Cardiovascular system:RRR. No murmurs, rubs, gallops. Gastrointestinal system: Abdomen is obese, nondistended, soft and nontender. No organomegaly or masses felt. Normal bowel sounds heard. Central nervous system: Alert and oriented. No focal neurological deficits. Extremities: No cyanosis or clubbing. Trace edema bilaterally. Skin: No rashes, lesions or ulcers Psychiatry: Judgement and insight appear normal. Mood & affect appropriate.     Data Reviewed: I have personally reviewed following labs and imaging studies  CBC: Recent Labs  Lab 07/03/19 1332 07/04/19 0611 07/05/19 0809  WBC 19.1* 12.9* 11.9*  HGB 7.8* 8.0* 8.2*  HCT 24.6* 25.1* 26.2*  MCV 98.0 97.7 98.1  PLT 270 200 [id number]   Basic Metabolic Panel: Recent Labs  Lab 07/03/19 1332 07/04/19 0611  NA 137 140  K 4.7 3.8  CL 105 108  CO2  25 26  GLUCOSE 156* 139*  BUN 34* 24*  CREATININE 0.95 0.85  CALCIUM 8.5* 7.9*   GFR: Estimated Creatinine Clearance: 134.4 mL/min (by C-G formula based on SCr of 0.85 mg/dL).   Liver Function Tests: Recent Labs  Lab 07/03/19 1332  AST 20  ALT 23  ALKPHOS 58  BILITOT 0.4  PROT 6.5  ALBUMIN 3.6   Coagulation Profile: Recent Labs  Lab 07/03/19 1332  INR 1.0    Recent Results (from the past 240 hour(s))  SARS Coronavirus 2 [facility] order, Performed in [facility] hospital lab) Nasopharyngeal Nasopharyngeal Swab     Status: None   Collection Time: 07/03/19  2:06 PM   Specimen: Nasopharyngeal Swab  Result Value Ref Range Status   SARS Coronavirus 2 NEGATIVE NEGATIVE Final    Comment: (NOTE) If result is NEGATIVE SARS-CoV-2 target nucleic acids are NOT DETECTED. The SARS-CoV-2 RNA is generally detectable in upper and lower  respiratory specimens during the acute phase of infection. The lowest  concentration of SARS-CoV-2 viral copies this assay can detect is 250  copies / mL. A negative result does not preclude SARS-CoV-2 infection  and should not be used as the sole basis for treatment or other  patient management decisions.  A negative result may occur with  improper specimen collection / handling, submission of specimen other  than nasopharyngeal swab, presence of viral mutation(s) within the  areas targeted by this assay, and inadequate number of viral copies  (<250 copies / mL). A negative result must be combined with clinical  observations, patient history, and epidemiological information. If result is POSITIVE SARS-CoV-2 target nucleic acids are DETECTED. The SARS-CoV-2 RNA is generally detectable in upper and lower  respiratory specimens dur ing the acute phase of infection.  Positive  results are indicative of active infection with SARS-CoV-2.  Clinical  correlation with patient history and other diagnostic information is  necessary to determine patient  infection status.  Positive results do  not rule out bacterial infection or co-infection with other viruses. If result is PRESUMPTIVE POSTIVE SARS-CoV-2 nucleic acids MAY BE PRESENT.   A presumptive positive result was obtained on the submitted specimen  and confirmed on repeat testing.  While 2019 novel coronavirus  (SARS-CoV-2) nucleic acids may be present in the submitted sample  additional confirmatory testing may be necessary for epidemiological  and / or clinical management purposes  to differentiate between  SARS-CoV-2 and other Sarbecovirus currently known to infect humans.  If clinically indicated additional testing with an alternate test  methodology [phone]) is advised. The SARS-CoV-2 RNA is generally  detectable in upper and lower respiratory sp ecimens during the acute  phase of infection. The expected result is Negative. Fact Sheet for Patients:  StrictlyIdeas.no Fact Sheet for Healthcare Providers: BankingDealers.co.za This test is not yet approved or cleared by the [place] FDA and has been authorized for detection and/or diagnosis of SARS-CoV-2 by FDA under an Emergency Use Authorization (EUA).  This EUA will remain in effect (meaning this test can be used) for the duration of the COVID-19 declaration under Section 564(b)(1) of the Act, 21 U.S.C. section 360bbb-3(b)(1), unless the authorization is terminated or revoked sooner. Performed at [facility], [address], [place], [place] [address]  Culture, blood (routine x 2)     Status: None (Preliminary result)   Collection Time: 07/03/19  3:14 PM   Specimen: BLOOD RIGHT FOREARM  Result Value Ref Range Status   Specimen Description BLOOD RIGHT FOREARM  Final   Special Requests   Final    BOTTLES DRAWN AEROBIC AND ANAEROBIC Blood Culture adequate volume   Culture   Final    NO GROWTH 2 DAYS Performed at [facility], [address], [place], [place] [address]     Report Status PENDING  Incomplete  Culture, blood (routine x 2)     Status: None (Preliminary result)   Collection Time: 07/03/19  3:27 PM   Specimen: Left Antecubital; Blood  Result Value Ref Range Status   Specimen Description LEFT ANTECUBITAL  Final   Special Requests   Final    BOTTLES DRAWN AEROBIC AND ANAEROBIC Blood Culture adequate volume   Culture   Final    NO GROWTH 2 DAYS Performed at [facility], [address], [place], [place] [address]    Report Status PENDING  Incomplete     Radiology Studies: No results found.  Scheduled Meds: . baclofen  20 mg Oral TID  . citalopram  20 mg Oral Daily  . mouth rinse  15 mL Mouth Rinse BID  . mometasone-formoterol  2 puff Inhalation BID  . umeclidinium bromide  1 puff Inhalation Daily   Continuous Infusions: . pantoprozole (PROTONIX) infusion 8 mg/hr (07/05/19 1414)     LOS: 2 days    Time spent: 30 minutes   Barton Dubois, MD [facility] Pager [phone]   07/05/2019, 2:34 PM

## 2019-07-06 LAB — CBC
HCT: 23.5 % — ABNORMAL LOW (ref 39.0–52.0)
Hemoglobin: 7.4 g/dL — ABNORMAL LOW (ref 13.0–17.0)
MCH: 31 pg (ref 26.0–34.0)
MCHC: 31.5 g/dL (ref 30.0–36.0)
MCV: 98.3 fL (ref 80.0–100.0)
Platelets: 200 10*3/uL (ref 150–400)
RBC: 2.39 MIL/uL — ABNORMAL LOW (ref 4.22–5.81)
RDW: 15 % (ref 11.5–15.5)
WBC: 11.3 10*3/uL — ABNORMAL HIGH (ref 4.0–10.5)
nRBC: 0.4 % — ABNORMAL HIGH (ref 0.0–0.2)

## 2019-07-06 LAB — HEMOGLOBIN AND HEMATOCRIT, BLOOD
HCT: 22.7 % — ABNORMAL LOW (ref 39.0–52.0)
Hemoglobin: 7.2 g/dL — ABNORMAL LOW (ref 13.0–17.0)

## 2019-07-06 MED ORDER — SODIUM CHLORIDE 0.9 % IV SOLN
510.0000 mg | Freq: Once | INTRAVENOUS | Status: AC
  Administered 2019-07-06: 11:00:00 510 mg via INTRAVENOUS
  Filled 2019-07-06: qty 510
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
## 2019-07-06 NOTE — Progress Notes (Signed)
PROGRESS NOTE    Stephen Warren  [id number] DOB: 08-02-1956 DOA: 07/03/2019 PCP: [name], MD    Brief Narrative:  63 year old male with history of asthma/COPD, depression, anxiety and morbid obesity; who presented to the emergency department after 5 days of melena that has become increasingly worse.  Patient reports an intermittent episode of shortness of breath with activity and feeling lightheaded.  Positive fecal occult blood test with melanotic appearance in the ED; hemoglobin 7.3.   Assessment & Plan: 1-Symptomatic anemia/acute blood loss anemia: Due to upper GI bleed in the setting of duodenal bulb ulcer and gastritis. -Continue to follow hemoglobin trend -Status post 2 unit PRBCs, hemoglobin above 7.4 (with attributed shifting/hemodilution effect) -Hemodynamically stable and asymptomatic. -IV iron ordered; no signs of ongoing overt bleeding appreciated. -Endoscopy demonstrated duodenal bulb ulcer and mild gastritis; status post epinephrine injection for hemostasis. -Continue IV PPI (plan is for 72 hours of IV infusion; anticipated in treatment day 07/07/2019 afternoon). -Continue advancing diet as per GI recommendations.   -No heparin products for DVT prophylaxis -Continue to avoid the use of NSAIDs.  2-COPD (chronic obstructive pulmonary disease) (HCC) -No wheezing, no shortness of breath. -Continue home inhalers regimen.  3-Depression/anxiety -No suicidal ideation or hallucinations -Stable mood. -Continue citalopram   4-morbid obesity -Body mass index is 42.81 kg/m. -Low calorie diet, portion control and increase physical activity discussed with patient.  DVT prophylaxis: SCDs Code Status: Full code Family Communication: No family at bedside. Disposition Plan: Remains inpatient, continue IV PPI as recommended by GI with intention to complete 72 hours of IV infusion prior to discharge., advance diet to full liquid, follow GI service recommendations.  Continue  to follow hemoglobin trend.  Consultants:   Gastroenterology service  Procedures:   Endoscopy 07/04/2019: Positive duodenal bulb ulcer and mild gastritis; status post epinephrine solution for hemostasis.  Antimicrobials:  Anti-infectives (From admission, onward)   None      Subjective: No chest pain, no shortness of breath, no nausea, no vomiting, no abdominal pain.  Patient reports feeling good and is in no distress.  No further overt bleeding appreciated.  Objective: Vitals:   07/06/19 0618 07/06/19 0742 07/06/19 0744 07/06/19 1404  BP: 107/71   112/74  Pulse: 90   98  Resp: 20   19  Temp: 98.6 F (37 C)   98.9 F (37.2 C)  TempSrc: Oral   Oral  SpO2: 99% 96% 96% 99%  Weight:      Height:        Intake/Output Summary (Last 24 hours) at 07/06/2019 1756 Last data filed at 07/06/2019 1740 Gross per 24 hour  Intake 1305.58 ml  Output 700 ml  Net 605.58 ml   Filed Weights   07/03/19 1142 07/03/19 1818  Weight: (!) 145.2 kg (!) 147.2 kg    Examination: General exam: Alert, awake, oriented x 3; no acute distress.  Reports no chest pain, no shortness of breath, no nausea, no vomiting.  No further episodes of overt bleeding appreciated.  Patient's tolerating diet. Respiratory system: Clear to auscultation. Normal respiratory effort Cardiovascular system:RRR. No murmurs, rubs, gallops. Gastrointestinal system: Abdomen is obese nondistended, soft and nontender. No organomegaly or masses felt. Normal bowel sounds heard. Central nervous system: Alert and oriented. No focal neurological deficits. Extremities: No cyanosis or clubbing.  Trace edema appreciated bilaterally. Skin: No rashes, lesions or ulcers Psychiatry: Judgement and insight appear normal. Mood & affect appropriate.   Data Reviewed: I have personally reviewed following labs and imaging studies  CBC: Recent Labs  Lab 07/03/19 1332 07/04/19 0611 07/05/19 0809 07/06/19 0609 07/06/19 1549  WBC 19.1* 12.9*  11.9* 11.3*  --   HGB 7.8* 8.0* 8.2* 7.4* 7.2*  HCT 24.6* 25.1* 26.2* 23.5* 22.7*  MCV 98.0 97.7 98.1 98.3  --   PLT 270 200 221 200  --    Basic Metabolic Panel: Recent Labs  Lab 07/03/19 1332 07/04/19 0611  NA 137 140  K 4.7 3.8  CL 105 108  CO2 25 26  GLUCOSE 156* 139*  BUN 34* 24*  CREATININE 0.95 0.85  CALCIUM 8.5* 7.9*   GFR: Estimated Creatinine Clearance: 134.4 mL/min (by C-G formula based on SCr of 0.85 mg/dL).   Liver Function Tests: Recent Labs  Lab 07/03/19 1332  AST 20  ALT 23  ALKPHOS 58  BILITOT 0.4  PROT 6.5  ALBUMIN 3.6   Coagulation Profile: Recent Labs  Lab 07/03/19 1332  INR 1.0    Recent Results (from the past 240 hour(s))  SARS Coronavirus 2 [facility] order, Performed in [facility] hospital lab) Nasopharyngeal Nasopharyngeal Swab     Status: None   Collection Time: 07/03/19  2:06 PM   Specimen: Nasopharyngeal Swab  Result Value Ref Range Status   SARS Coronavirus 2 NEGATIVE NEGATIVE Final    Comment: (NOTE) If result is NEGATIVE SARS-CoV-2 target nucleic acids are NOT DETECTED. The SARS-CoV-2 RNA is generally detectable in upper and lower  respiratory specimens during the acute phase of infection. The lowest  concentration of SARS-CoV-2 viral copies this assay can detect is 250  copies / mL. A negative result does not preclude SARS-CoV-2 infection  and should not be used as the sole basis for treatment or other  patient management decisions.  A negative result may occur with  improper specimen collection / handling, submission of specimen other  than nasopharyngeal swab, presence of viral mutation(s) within the  areas targeted by this assay, and inadequate number of viral copies  (<250 copies / mL). A negative result must be combined with clinical  observations, patient history, and epidemiological information. If result is POSITIVE SARS-CoV-2 target nucleic acids are DETECTED. The SARS-CoV-2 RNA is generally detectable in upper  and lower  respiratory specimens dur ing the acute phase of infection.  Positive  results are indicative of active infection with SARS-CoV-2.  Clinical  correlation with patient history and other diagnostic information is  necessary to determine patient infection status.  Positive results do  not rule out bacterial infection or co-infection with other viruses. If result is PRESUMPTIVE POSTIVE SARS-CoV-2 nucleic acids MAY BE PRESENT.   A presumptive positive result was obtained on the submitted specimen  and confirmed on repeat testing.  While 2019 novel coronavirus  (SARS-CoV-2) nucleic acids may be present in the submitted sample  additional confirmatory testing may be necessary for epidemiological  and / or clinical management purposes  to differentiate between  SARS-CoV-2 and other Sarbecovirus currently known to infect humans.  If clinically indicated additional testing with an alternate test  methodology [phone]) is advised. The SARS-CoV-2 RNA is generally  detectable in upper and lower respiratory sp ecimens during the acute  phase of infection. The expected result is Negative. Fact Sheet for Patients:  StrictlyIdeas.no Fact Sheet for Healthcare Providers: BankingDealers.co.za This test is not yet approved or cleared by the [place] FDA and has been authorized for detection and/or diagnosis of SARS-CoV-2 by FDA under an Emergency Use Authorization (EUA).  This EUA will remain in effect (meaning  this test can be used) for the duration of the COVID-19 declaration under Section 564(b)(1) of the Act, 21 U.S.C. section 360bbb-3(b)(1), unless the authorization is terminated or revoked sooner. Performed at [facility], [address], [place], [place] [address]   Culture, blood (routine x 2)     Status: None (Preliminary result)   Collection Time: 07/03/19  3:14 PM   Specimen: BLOOD RIGHT FOREARM  Result Value Ref Range Status    Specimen Description BLOOD RIGHT FOREARM  Final   Special Requests   Final    BOTTLES DRAWN AEROBIC AND ANAEROBIC Blood Culture adequate volume   Culture   Final    NO GROWTH 3 DAYS Performed at [facility], [address] [place], [place] [address]    Report Status PENDING  Incomplete  Culture, blood (routine x 2)     Status: None (Preliminary result)   Collection Time: 07/03/19  3:27 PM   Specimen: Left Antecubital; Blood  Result Value Ref Range Status   Specimen Description LEFT ANTECUBITAL  Final   Special Requests   Final    BOTTLES DRAWN AEROBIC AND ANAEROBIC Blood Culture adequate volume   Culture   Final    NO GROWTH 3 DAYS Performed at [facility], [address] [place], [place] [address]    Report Status PENDING  Incomplete     Radiology Studies: No results found.  Scheduled Meds: . baclofen  20 mg Oral TID  . citalopram  20 mg Oral Daily  . mouth rinse  15 mL Mouth Rinse BID  . mometasone-formoterol  2 puff Inhalation BID  . umeclidinium bromide  1 puff Inhalation Daily   Continuous Infusions: . pantoprozole (PROTONIX) infusion 8 mg/hr (07/06/19 1419)     LOS: 3 days    Time spent: 30 minutes   Barton Dubois, MD [facility] Pager [phone]   07/06/2019, 5:56 PM

## 2019-07-06 NOTE — Progress Notes (Signed)
    Subjective: No melena or hematochezia. Discussed with nursing staff as well. Reports streaks in underwear: dark brown. 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 No abdominal pain. Feels slightly unstable when first standing up then improves. Denies fatigue.   Objective: Vital signs in last 24 hours: Temp:  [98.6 F (37 C)-98.9 F (37.2 C)] 98.6 F (37 C) (08/25 0618) Pulse Rate:  [82-90] 90 (08/25 0618) Resp:  [20] 20 (08/25 0618) BP: (107-120)/(71) 107/71 (08/25 0618) SpO2:  [95 %-99 %] 96 % (08/25 0744)   General:   Alert and oriented, pleasant Head:  Normocephalic and atraumatic. Abdomen:  Bowel sounds present, soft, obese, non-tender No rebound or guarding. No masses appreciated  Neurologic:  Alert and  oriented x4 Psych:  Normal mood and affect.  Intake/Output from previous day: 08/24 0701 - 08/25 0700 In: 1437.3 [P.O.:720; I.V.:717.3] Out: 400 [Urine:400] Intake/Output this shift: No intake/output data recorded.  Lab Results: Recent Labs    07/04/19 0611 07/05/19 0809 07/06/19 0609  WBC 12.9* 11.9* 11.3*  HGB 8.0* 8.2* 7.4*  HCT 25.1* 26.2* 23.5*  PLT 200 221 200   BMET Recent Labs    07/03/19 1332 07/04/19 0611  NA 137 140  K 4.7 3.8  CL 105 108  CO2 25 26  GLUCOSE 156* 139*  BUN 34* 24*  CREATININE 0.95 0.85  CALCIUM 8.5* 7.9*   LFT Recent Labs    07/03/19 1332  PROT 6.5  ALBUMIN 3.6  AST 20  ALT 23  ALKPHOS 58  BILITOT 0.4  BILIDIR 0.1  IBILI 0.3   PT/INR Recent Labs    07/03/19 1332  LABPROT 13.3  INR 1.0    Assessment: 63 year old male admitted with acute blood loss anemia and melena due to duodenal ulcer in setting of NSAIDs. EGD 8/23 with oozing cratered duodenal ulcer with visible vessel s/p bleeding control therapy. PPI infusion to continue for total of 72 hours, ending 8/26 at 1200. 2 units PRBCs received this admission, with Hgb yesterday 8.2 and had been stable for 24 hours. Hgb now down to 7.4.  Discussed with nursing staff: no melena or hematochezia. Will recheck Hgb at 1500.    Plan: Recheck H/H at 1500 PPI infusion total of 72 hours (time starting post-EGD), ending Wednesday at noon PPI BID after PPI infusion completed Follow-up on pathology Avoid NSAIDs Monitor for overt bleeding   Annitta Needs, PhD, ANP-BC [facility] Gastroenterology    LOS: 3 days    07/06/2019, 8:25 AM

## 2019-07-07 ENCOUNTER — Telehealth: Payer: Self-pay | Admitting: Gastroenterology

## 2019-07-07 LAB — CBC
HCT: 23.9 % — ABNORMAL LOW (ref 39.0–52.0)
Hemoglobin: 7.4 g/dL — ABNORMAL LOW (ref 13.0–17.0)
MCH: 30.8 pg (ref 26.0–34.0)
MCHC: 31 g/dL (ref 30.0–36.0)
MCV: 99.6 fL (ref 80.0–100.0)
Platelets: 211 10*3/uL (ref 150–400)
RBC: 2.4 MIL/uL — ABNORMAL LOW (ref 4.22–5.81)
RDW: 15.6 % — ABNORMAL HIGH (ref 11.5–15.5)
WBC: 9.9 10*3/uL (ref 4.0–10.5)
nRBC: 0.4 % — ABNORMAL HIGH (ref 0.0–0.2)

## 2019-07-07 MED ORDER — PANTOPRAZOLE SODIUM 40 MG PO TBEC: 40.0000 mg | DELAYED_RELEASE_TABLET | Freq: Every day | ORAL | 11 refills | Status: DC

## 2019-07-07 MED ORDER — PANTOPRAZOLE SODIUM 40 MG PO TBEC: 40.0000 mg | DELAYED_RELEASE_TABLET | Freq: Two times a day (BID) | ORAL | Status: DC

## 2019-07-07 MED ORDER — LIDOCAINE 5 % EX PTCH: 1.0000 | MEDICATED_PATCH | Freq: Two times a day (BID) | CUTANEOUS | 11 refills | Status: DC

## 2019-07-07 MED ORDER — PANTOPRAZOLE SODIUM 40 MG PO TBEC: 40.0000 mg | DELAYED_RELEASE_TABLET | Freq: Two times a day (BID) | ORAL | 0 refills | Status: DC

## 2019-07-07 MED ORDER — ACETAMINOPHEN 500 MG PO TABS: 1000.0000 mg | ORAL_TABLET | Freq: Four times a day (QID) | ORAL | 2 refills | Status: DC | PRN

## 2019-07-07 NOTE — Progress Notes (Signed)
CC'D TO PCP °

## 2019-07-07 NOTE — Telephone Encounter (Signed)
We will call patient to r/s once we receive schedule for November.

## 2019-07-07 NOTE — Discharge Summary (Signed)
Physician Discharge Summary  Stephen Warren [id number] DOB: 03-Nov-1956 DOA: 07/03/2019  PCP: [name], MD  Admit date: 07/03/2019  Discharge date: 07/07/2019  Admitted From:Home  Disposition:  Home  Recommendations for Outpatient Follow-up:  1. Follow up with PCP in 1-2 weeks 2. Please obtain CBC in one week to ensure stability of hemoglobin/hematocrit levels 3. Follow-up with GI Dr. Gala Romney in the next 1 to 2 months for colonoscopy 4. Continue on PPI twice daily as prescribed for the next 30 days and then daily thereafter 5. Discontinue home naproxen as well as any other NSAID 6. Please use Tylenol and Lidoderm patches as prescribed to assist with low back pain  Home Health: Yes with PT  Equipment/Devices: Has home Rollator and home oxygen  Discharge Condition: Stable  CODE STATUS: Full  Diet recommendation: Heart Healthy  Brief/Interim Summary: 63 year old male with history of asthma/COPD, depression, anxiety and morbid obesity; who presented to the emergency department after 5 days of melena that has become increasingly worse.  Patient reports an intermittent episode of shortness of breath with activity and feeling lightheaded.  Positive fecal occult blood test with melanotic appearance in the ED; hemoglobin 7.3.  Patient was admitted with symptomatic acute blood loss anemia due to upper GI bleed with noted duodenal bulb ulcer and gastritis on upper endoscopy performed on 8/23.  He had received 2 units of PRBCs with improvement in his hemoglobin and hematocrit levels as well as some IV iron.  He had remained on IV PPI for the last 72 hours and has completed his 72-hour infusion at noon on 8/26, the day of discharge.  He has had no further overt bleeding noted and his hemoglobin has remained stable at 7.4 today with a value of 7.2 noted yesterday afternoon.  Vital signs remained stable and he is tolerating diet.  I have discussed the case with Dr. Oneida Alar who agrees that the  patient is stable and appropriate for discharge and will have close follow-up with his gastroenterologist Dr. Gala Romney for colonoscopy in the next 1 to 2 months.  I have stressed the importance of discontinuing his home naproxen as well as any other over-the-counter NSAIDs that he may use and to remain on PPI as prescribed.  He has been prescribed some Tylenol as well as Lidoderm patches to use to help assist with his lumbar arthritis pain.  He will require repeat CBC in close follow-up with his PCP in the next 1 week to ensure that blood counts are remaining stable and that there are no further incidents of bleeding noted.  Discharge Diagnoses:  Principal Problem:   Symptomatic anemia Active Problems:   Acute upper GI bleed   COPD (chronic obstructive pulmonary disease) (HCC)   Depression   Duodenal ulcer  Principle discharge diagnoses: Symptomatic acute blood loss anemia in the setting of duodenal bulb ulcer and gastritis.  Discharge Instructions  Discharge Instructions    Diet - low sodium heart healthy   Complete by: As directed    Increase activity slowly   Complete by: As directed      Allergies as of 07/07/2019      Reactions   Other Anaphylaxis   Caterpillar    Iodine    Not sure of reaction   Shellfish Allergy Swelling      Medication List    STOP taking these medications   naproxen 500 MG tablet Commonly known as: NAPROSYN     TAKE these medications   acetaminophen 500 MG tablet Commonly known  as: TYLENOL Take 2 tablets (1,000 mg total) by mouth every 6 (six) hours as needed for mild pain or moderate pain.   baclofen 20 MG tablet Commonly known as: LIORESAL Take 20 mg by mouth 3 (three) times daily.   citalopram 20 MG tablet Commonly known as: CELEXA Take 20 mg by mouth daily.   hydrocortisone cream 1 % Apply 1 application topically 2 (two) times daily as needed for itching.   lidocaine 5 % Commonly known as: Lidoderm Place 1 patch onto the skin every  12 (twelve) hours. Remove & Discard patch within 12 hours or as directed by MD   multivitamin with minerals Tabs tablet Take 1 tablet by mouth daily.   pantoprazole 40 MG tablet Commonly known as: PROTONIX Take 1 tablet (40 mg total) by mouth 2 (two) times daily before a meal.   pantoprazole 40 MG tablet Commonly known as: Protonix Take 1 tablet (40 mg total) by mouth daily. Start taking on: August 07, 2019   albuterol (2.5 MG/3ML) 0.083% nebulizer solution Commonly known as: PROVENTIL Take 2.5 mg by nebulization every 6 (six) hours as needed for wheezing or shortness of breath.   ProAir HFA 108 (90 Base) MCG/ACT inhaler Generic drug: albuterol Inhale 1 puff into the lungs 4 (four) times daily as needed for wheezing or shortness of breath.   Symbicort 160-4.5 MCG/ACT inhaler Generic drug: budesonide-formoterol Inhale 1 puff into the lungs 2 (two) times daily.   tiotropium 18 MCG inhalation capsule Commonly known as: SPIRIVA Place 18 mcg into inhaler and inhale daily.   traZODone 100 MG tablet Commonly known as: DESYREL Take 200 mg by mouth as needed.      Follow-up Information    [name], MD Follow up in 1 week(s).   Specialty: Internal Medicine Contact information: [place] [address] [phone]        Daneil Dolin, MD Follow up in 4 week(s).   Specialty: Gastroenterology Contact information: [place] [address] [phone]          Allergies  Allergen Reactions  . Other Anaphylaxis    Caterpillar   . Iodine     Not sure of reaction  . Shellfish Allergy Swelling    Consultations:  Gastroenterology   Procedures/Studies: Dg Chest 2 View  Result Date: 07/03/2019 CLINICAL DATA:  Dyspnea, COPD EXAM: CHEST - 2 VIEW COMPARISON:  02/10/2014 chest radiograph. FINDINGS: Stable cardiomediastinal silhouette with normal heart size. No pneumothorax. No pleural effusion. Hyperinflated lungs. No  pulmonary edema. No acute consolidative airspace disease. IMPRESSION: 1. Hyperinflated lungs, compatible with the reported history of COPD. 2. No acute cardiopulmonary disease. Electronically Signed   By: Ilona Sorrel M.D.   On: 07/03/2019 12:32     Discharge Exam: Vitals:   07/07/19 0727 07/07/19 0728  BP:    Pulse:    Resp:    Temp:    SpO2: 97% 97%   Vitals:   07/06/19 2119 07/07/19 0619 07/07/19 0727 07/07/19 0728  BP: 134/75 112/64    Pulse: 87 85    Resp: 20 19    Temp: 98.6 F (37 C) 97.8 F (36.6 C)    TempSrc: Oral Oral    SpO2: 98% 98% 97% 97%  Weight:      Height:        General: Pt is alert, awake, not in acute distress, obese Cardiovascular: RRR, S1/S2 +, no rubs, no gallops Respiratory: CTA bilaterally, no wheezing, no rhonchi, on nasal  cannula oxygen Abdominal: Soft, NT, ND, bowel sounds + Extremities: no edema, no cyanosis    The results of significant diagnostics from this hospitalization (including imaging, microbiology, ancillary and laboratory) are listed below for reference.     Microbiology: Recent Results (from the past 240 hour(s))  SARS Coronavirus 2 [facility] order, Performed in [facility] hospital lab) Nasopharyngeal Nasopharyngeal Swab     Status: None   Collection Time: 07/03/19  2:06 PM   Specimen: Nasopharyngeal Swab  Result Value Ref Range Status   SARS Coronavirus 2 NEGATIVE NEGATIVE Final    Comment: (NOTE) If result is NEGATIVE SARS-CoV-2 target nucleic acids are NOT DETECTED. The SARS-CoV-2 RNA is generally detectable in upper and lower  respiratory specimens during the acute phase of infection. The lowest  concentration of SARS-CoV-2 viral copies this assay can detect is 250  copies / mL. A negative result does not preclude SARS-CoV-2 infection  and should not be used as the sole basis for treatment or other  patient management decisions.  A negative result may occur with  improper specimen collection / handling, submission  of specimen other  than nasopharyngeal swab, presence of viral mutation(s) within the  areas targeted by this assay, and inadequate number of viral copies  (<250 copies / mL). A negative result must be combined with clinical  observations, patient history, and epidemiological information. If result is POSITIVE SARS-CoV-2 target nucleic acids are DETECTED. The SARS-CoV-2 RNA is generally detectable in upper and lower  respiratory specimens dur ing the acute phase of infection.  Positive  results are indicative of active infection with SARS-CoV-2.  Clinical  correlation with patient history and other diagnostic information is  necessary to determine patient infection status.  Positive results do  not rule out bacterial infection or co-infection with other viruses. If result is PRESUMPTIVE POSTIVE SARS-CoV-2 nucleic acids MAY BE PRESENT.   A presumptive positive result was obtained on the submitted specimen  and confirmed on repeat testing.  While 2019 novel coronavirus  (SARS-CoV-2) nucleic acids may be present in the submitted sample  additional confirmatory testing may be necessary for epidemiological  and / or clinical management purposes  to differentiate between  SARS-CoV-2 and other Sarbecovirus currently known to infect humans.  If clinically indicated additional testing with an alternate test  methodology [phone]) is advised. The SARS-CoV-2 RNA is generally  detectable in upper and lower respiratory sp ecimens during the acute  phase of infection. The expected result is Negative. Fact Sheet for Patients:  StrictlyIdeas.no Fact Sheet for Healthcare Providers: BankingDealers.co.za This test is not yet approved or cleared by the [place] FDA and has been authorized for detection and/or diagnosis of SARS-CoV-2 by FDA under an Emergency Use Authorization (EUA).  This EUA will remain in effect (meaning this test can be used) for  the duration of the COVID-19 declaration under Section 564(b)(1) of the Act, 21 U.S.C. section 360bbb-3(b)(1), unless the authorization is terminated or revoked sooner. Performed at [facility], [address], [place], [place] [address]   Culture, blood (routine x 2)     Status: None (Preliminary result)   Collection Time: 07/03/19  3:14 PM   Specimen: BLOOD RIGHT FOREARM  Result Value Ref Range Status   Specimen Description BLOOD RIGHT FOREARM  Final   Special Requests   Final    BOTTLES DRAWN AEROBIC AND ANAEROBIC Blood Culture adequate volume   Culture   Final    NO GROWTH 4 DAYS Performed at [facility],  [address] [place],  [address]    Report Status PENDING  Incomplete  Culture, blood (routine x 2)     Status: None (Preliminary result)   Collection Time: 07/03/19  3:27 PM   Specimen: Left Antecubital; Blood  Result Value Ref Range Status   Specimen Description LEFT ANTECUBITAL  Final   Special Requests   Final    BOTTLES DRAWN AEROBIC AND ANAEROBIC Blood Culture adequate volume   Culture   Final    NO GROWTH 4 DAYS Performed at [facility], [address], [place],  [address]    Report Status PENDING  Incomplete     Labs: BNP (last 3 results) Recent Labs    07/03/19 1332  BNP [id number]   Basic Metabolic Panel: Recent Labs  Lab 07/03/19 1332 07/04/19 0611  NA 137 140  K 4.7 3.8  CL 105 108  CO2 25 26  GLUCOSE 156* 139*  BUN 34* 24*  CREATININE 0.95 0.85  CALCIUM 8.5* 7.9*   Liver Function Tests: Recent Labs  Lab 07/03/19 1332  AST 20  ALT 23  ALKPHOS 58  BILITOT 0.4  PROT 6.5  ALBUMIN 3.6   No results for input(s): LIPASE, AMYLASE in the last 168 hours. No results for input(s): AMMONIA in the last 168 hours. CBC: Recent Labs  Lab 07/03/19 1332 07/04/19 0611 07/05/19 0809 07/06/19 0609 07/06/19 1549 07/07/19 0629  WBC 19.1* 12.9* 11.9* 11.3*  --  9.9  HGB 7.8* 8.0* 8.2* 7.4* 7.2* 7.4*  HCT 24.6* 25.1* 26.2* 23.5* 22.7*  23.9*  MCV 98.0 97.7 98.1 98.3  --  99.6  PLT 270 200 221 200  --  211   Cardiac Enzymes: No results for input(s): CKTOTAL, CKMB, CKMBINDEX, TROPONINI in the last 168 hours. BNP: Invalid input(s): POCBNP CBG: No results for input(s): GLUCAP in the last 168 hours. D-Dimer No results for input(s): DDIMER in the last 72 hours. Hgb A1c No results for input(s): HGBA1C in the last 72 hours. Lipid Profile No results for input(s): CHOL, HDL, LDLCALC, TRIG, CHOLHDL, LDLDIRECT in the last 72 hours. Thyroid function studies No results for input(s): TSH, T4TOTAL, T3FREE, THYROIDAB in the last 72 hours.  Invalid input(s): FREET3 Anemia work up No results for input(s): VITAMINB12, FOLATE, FERRITIN, TIBC, IRON, RETICCTPCT in the last 72 hours. Urinalysis No results found for: COLORURINE, APPEARANCEUR, LABSPEC, [place], GLUCOSEU, [place], BILIRUBINUR, KETONESUR, PROTEINUR, UROBILINOGEN, NITRITE, LEUKOCYTESUR Sepsis Labs Invalid input(s): PROCALCITONIN,  WBC,  [place] [facility] Recent Results (from the past 240 hour(s))  SARS Coronavirus 2 [facility] order, Performed in [facility] hospital lab) Nasopharyngeal Nasopharyngeal Swab     Status: None   Collection Time: 07/03/19  2:06 PM   Specimen: Nasopharyngeal Swab  Result Value Ref Range Status   SARS Coronavirus 2 NEGATIVE NEGATIVE Final    Comment: (NOTE) If result is NEGATIVE SARS-CoV-2 target nucleic acids are NOT DETECTED. The SARS-CoV-2 RNA is generally detectable in upper and lower  respiratory specimens during the acute phase of infection. The lowest  concentration of SARS-CoV-2 viral copies this assay can detect is 250  copies / mL. A negative result does not preclude SARS-CoV-2 infection  and should not be used as the sole basis for treatment or other  patient management decisions.  A negative result may occur with  improper specimen collection / handling, submission of specimen other  than nasopharyngeal swab, presence of  viral mutation(s) within the  areas targeted by this assay, and inadequate number of viral copies  (<250  copies / mL). A negative result must be combined with clinical  observations, patient history, and epidemiological information. If result is POSITIVE SARS-CoV-2 target nucleic acids are DETECTED. The SARS-CoV-2 RNA is generally detectable in upper and lower  respiratory specimens dur ing the acute phase of infection.  Positive  results are indicative of active infection with SARS-CoV-2.  Clinical  correlation with patient history and other diagnostic information is  necessary to determine patient infection status.  Positive results do  not rule out bacterial infection or co-infection with other viruses. If result is PRESUMPTIVE POSTIVE SARS-CoV-2 nucleic acids MAY BE PRESENT.   A presumptive positive result was obtained on the submitted specimen  and confirmed on repeat testing.  While 2019 novel coronavirus  (SARS-CoV-2) nucleic acids may be present in the submitted sample  additional confirmatory testing may be necessary for epidemiological  and / or clinical management purposes  to differentiate between  SARS-CoV-2 and other Sarbecovirus currently known to infect humans.  If clinically indicated additional testing with an alternate test  methodology [phone]) is advised. The SARS-CoV-2 RNA is generally  detectable in upper and lower respiratory sp ecimens during the acute  phase of infection. The expected result is Negative. Fact Sheet for Patients:  StrictlyIdeas.no Fact Sheet for Healthcare Providers: BankingDealers.co.za This test is not yet approved or cleared by the [place] FDA and has been authorized for detection and/or diagnosis of SARS-CoV-2 by FDA under an Emergency Use Authorization (EUA).  This EUA will remain in effect (meaning this test can be used) for the duration of the COVID-19 declaration under Section  564(b)(1) of the Act, 21 U.S.C. section 360bbb-3(b)(1), unless the authorization is terminated or revoked sooner. Performed at [facility], [address] [place], [place] [address]   Culture, blood (routine x 2)     Status: None (Preliminary result)   Collection Time: 07/03/19  3:14 PM   Specimen: BLOOD RIGHT FOREARM  Result Value Ref Range Status   Specimen Description BLOOD RIGHT FOREARM  Final   Special Requests   Final    BOTTLES DRAWN AEROBIC AND ANAEROBIC Blood Culture adequate volume   Culture   Final    NO GROWTH 4 DAYS Performed at [facility], [address], [place], [place] [address]    Report Status PENDING  Incomplete  Culture, blood (routine x 2)     Status: None (Preliminary result)   Collection Time: 07/03/19  3:27 PM   Specimen: Left Antecubital; Blood  Result Value Ref Range Status   Specimen Description LEFT ANTECUBITAL  Final   Special Requests   Final    BOTTLES DRAWN AEROBIC AND ANAEROBIC Blood Culture adequate volume   Culture   Final    NO GROWTH 4 DAYS Performed at [facility], [address], [place],  [address]    Report Status PENDING  Incomplete     Time coordinating discharge: 35 minutes  SIGNED:   Rodena Goldmann, DO [facility] 07/07/2019, 3:02 PM  If 7PM-7AM, please contact night-coverage [email or web address] Password [email or web address]

## 2019-07-07 NOTE — Telephone Encounter (Signed)
Patient current admission, discharge soon.   1. He needs his colonoscopy with rmr with propofol rescheduled in 1-2 months. Originally ordered by EG.  2. He needs OV with RMR per [facility] in six months for f/u gi bleed.

## 2019-07-07 NOTE — Progress Notes (Signed)
PT DISCHARGED HOME, IV REMOVED, ANGIO INTACT, VS STABLE, DENIES C/O PAIN, PT VERBALIZED UNDERSTANDING OF ALL INSTRUCTIONS PROVIDED, LEFT FLOOR VIA WHEELCHAIR WITH BELONGINGS & O2 INTACT @3L /MIN., ACCOMPANIED BY NURSING STAFF.

## 2019-07-08 LAB — CULTURE, BLOOD (ROUTINE X 2)
Culture: NO GROWTH
Culture: NO GROWTH
Special Requests: ADEQUATE
Special Requests: ADEQUATE

## 2019-07-08 NOTE — Telephone Encounter (Signed)
ON RECALL FOR RMR FOLLOW UP

## 2019-07-12 ENCOUNTER — Other Ambulatory Visit: Payer: Self-pay | Admitting: *Deleted

## 2019-07-12 DIAGNOSIS — Z86010 Personal history of colonic polyps: Secondary | ICD-10-CM

## 2019-07-12 NOTE — Telephone Encounter (Signed)
Called patient. He is scheduled for 11/5 at 8:15am. Patient states he still has his prep Clenpiq at home. I advised will mail new instructions for this along with his pre-op/covid-19 appt. He voiced understanding. Mailing address confirmed.

## 2019-07-13 ENCOUNTER — Encounter: Payer: Self-pay | Admitting: *Deleted

## 2019-09-14 ENCOUNTER — Encounter (HOSPITAL_COMMUNITY): Payer: Self-pay

## 2019-09-14 ENCOUNTER — Encounter (HOSPITAL_COMMUNITY)
Admission: RE | Admit: 2019-09-14 | Discharge: 2019-09-14 | Disposition: A | Discharge status: Home / Self Care | Payer: Medicaid Other | Source: Ambulatory Visit | Attending: Internal Medicine | Admitting: Internal Medicine

## 2019-09-14 ENCOUNTER — Other Ambulatory Visit (HOSPITAL_COMMUNITY)
Admission: RE | Admit: 2019-09-14 | Discharge: 2019-09-14 | Disposition: A | Discharge status: Home / Self Care | Payer: Medicaid Other | Source: Ambulatory Visit | Attending: Internal Medicine | Admitting: Internal Medicine

## 2019-09-14 ENCOUNTER — Other Ambulatory Visit: Payer: Self-pay

## 2019-09-14 DIAGNOSIS — K5790 Diverticulosis of intestine, part unspecified, without perforation or abscess without bleeding: Secondary | ICD-10-CM | POA: Insufficient documentation

## 2019-09-14 DIAGNOSIS — Z20828 Contact with and (suspected) exposure to other viral communicable diseases: Secondary | ICD-10-CM | POA: Diagnosis not present

## 2019-09-14 DIAGNOSIS — Z86010 Personal history of colonic polyps: Secondary | ICD-10-CM | POA: Diagnosis not present

## 2019-09-14 DIAGNOSIS — Z01812 Encounter for preprocedural laboratory examination: Secondary | ICD-10-CM | POA: Insufficient documentation

## 2019-09-14 HISTORY — DX: Personal history of urinary calculi: Z87.442

## 2019-09-14 LAB — SARS CORONAVIRUS 2 (TAT 6-24 HRS): SARS Coronavirus 2: NEGATIVE

## 2019-09-14 LAB — CBC
HCT: 40.7 % (ref 39.0–52.0)
Hemoglobin: 12.7 g/dL — ABNORMAL LOW (ref 13.0–17.0)
MCH: 29.5 pg (ref 26.0–34.0)
MCHC: 31.2 g/dL (ref 30.0–36.0)
MCV: 94.7 fL (ref 80.0–100.0)
Platelets: 266 10*3/uL (ref 150–400)
RBC: 4.3 MIL/uL (ref 4.22–5.81)
RDW: 13.6 % (ref 11.5–15.5)
WBC: 10.4 10*3/uL (ref 4.0–10.5)
nRBC: 0 % (ref 0.0–0.2)

## 2019-09-14 NOTE — Patient Instructions (Signed)
   Your procedure is scheduled on: 09/16/2019  Report to [facility] at  6:45   AM.  Call this number if you have problems the morning of surgery: [phone]   Remember:              Follow Directions on the letter you received from Your Physician's office regarding the Bowel Prep  :  Take these medicines the morning of surgery with A SIP OF WATER: Celexa and use inhalers   Do not wear jewelry, make-up or nail polish.    Do not bring valuables to the hospital.  Contacts, dentures or bridgework may not be worn into surgery.  .   Patients discharged the day of surgery will not be allowed to drive home.     Colonoscopy, Adult, Care After This sheet gives you information about how to care for yourself after your procedure. Your health care provider may also give you more specific instructions. If you have problems or questions, contact your health care provider. What can I expect after the procedure? After the procedure, it is common to have:  A small amount of blood in your stool for 24 hours after the procedure.  Some gas.  Mild abdominal cramping or bloating.  Follow these instructions at home: General instructions   For the first 24 hours after the procedure: ? Do not drive or use machinery. ? Do not sign important documents. ? Do not drink alcohol. ? Do your regular daily activities at a slower pace than normal. ? Eat soft, easy-to-digest foods. ? Rest often.  Take over-the-counter or prescription medicines only as told by your health care provider.  It is up to you to get the results of your procedure. Ask your health care provider, or the department performing the procedure, when your results will be ready. Relieving cramping and bloating  Try walking around when you have cramps or feel bloated.  Apply heat to your abdomen as told by your health care provider. Use a heat source that your health care provider recommends, such as a moist heat pack or a heating pad.  ? Place a towel between your skin and the heat source. ? Leave the heat on for 20-30 minutes. ? Remove the heat if your skin turns bright red. This is especially important if you are unable to feel pain, heat, or cold. You may have a greater risk of getting burned. Eating and drinking  Drink enough fluid to keep your urine clear or pale yellow.  Resume your normal diet as instructed by your health care provider. Avoid heavy or fried foods that are hard to digest.  Avoid drinking alcohol for as long as instructed by your health care provider. Contact a health care provider if:  You have blood in your stool 2-3 days after the procedure. Get help right away if:  You have more than a small spotting of blood in your stool.  You pass large blood clots in your stool.  Your abdomen is swollen.  You have nausea or vomiting.  You have a fever.  You have increasing abdominal pain that is not relieved with medicine. This information is not intended to replace advice given to you by your health care provider. Make sure you discuss any questions you have with your health care provider. Document Released: 06/11/2004 Document Revised: 07/22/2016 Document Reviewed: 01/09/2016 Elsevier Interactive Patient Education  [employer].

## 2019-09-16 ENCOUNTER — Ambulatory Visit (HOSPITAL_COMMUNITY): Payer: Medicaid Other | Admitting: Anesthesiology

## 2019-09-16 ENCOUNTER — Encounter (HOSPITAL_COMMUNITY)
Admission: RE | Disposition: A | Discharge status: Home / Self Care | Payer: Self-pay | Source: Home / Self Care | Attending: Internal Medicine

## 2019-09-16 ENCOUNTER — Other Ambulatory Visit: Payer: Self-pay

## 2019-09-16 ENCOUNTER — Encounter (HOSPITAL_COMMUNITY): Payer: Self-pay | Admitting: *Deleted

## 2019-09-16 ENCOUNTER — Ambulatory Visit (HOSPITAL_COMMUNITY)
Admission: RE | Admit: 2019-09-16 | Discharge: 2019-09-16 | Disposition: A | Discharge status: Home / Self Care | Payer: Medicaid Other | Attending: Internal Medicine | Admitting: Internal Medicine

## 2019-09-16 DIAGNOSIS — Z87891 Personal history of nicotine dependence: Secondary | ICD-10-CM | POA: Diagnosis not present

## 2019-09-16 DIAGNOSIS — J449 Chronic obstructive pulmonary disease, unspecified: Secondary | ICD-10-CM | POA: Insufficient documentation

## 2019-09-16 DIAGNOSIS — Z09 Encounter for follow-up examination after completed treatment for conditions other than malignant neoplasm: Secondary | ICD-10-CM | POA: Diagnosis present

## 2019-09-16 DIAGNOSIS — Z6841 Body Mass Index (BMI) 40.0 and over, adult: Secondary | ICD-10-CM | POA: Diagnosis not present

## 2019-09-16 DIAGNOSIS — Z79899 Other long term (current) drug therapy: Secondary | ICD-10-CM | POA: Diagnosis not present

## 2019-09-16 DIAGNOSIS — Z9981 Dependence on supplemental oxygen: Secondary | ICD-10-CM | POA: Insufficient documentation

## 2019-09-16 DIAGNOSIS — K5730 Diverticulosis of large intestine without perforation or abscess without bleeding: Secondary | ICD-10-CM | POA: Insufficient documentation

## 2019-09-16 DIAGNOSIS — G4700 Insomnia, unspecified: Secondary | ICD-10-CM | POA: Diagnosis not present

## 2019-09-16 DIAGNOSIS — F410 Panic disorder [episodic paroxysmal anxiety] without agoraphobia: Secondary | ICD-10-CM | POA: Insufficient documentation

## 2019-09-16 DIAGNOSIS — Z86010 Personal history of colonic polyps: Secondary | ICD-10-CM | POA: Diagnosis not present

## 2019-09-16 DIAGNOSIS — F329 Major depressive disorder, single episode, unspecified: Secondary | ICD-10-CM | POA: Insufficient documentation

## 2019-09-16 HISTORY — PX: COLONOSCOPY WITH PROPOFOL: SHX5780

## 2019-09-16 SURGERY — COLONOSCOPY WITH PROPOFOL
Anesthesia: General

## 2019-09-16 MED ORDER — HYDROMORPHONE HCL 1 MG/ML IJ SOLN: 0.2500 mg | INTRAMUSCULAR | Status: DC | PRN

## 2019-09-16 MED ORDER — MIDAZOLAM HCL 2 MG/2ML IJ SOLN: 0.5000 mg | Freq: Once | INTRAMUSCULAR | Status: DC | PRN

## 2019-09-16 MED ORDER — PROPOFOL 500 MG/50ML IV EMUL
INTRAVENOUS | Status: DC | PRN
  Administered 2019-09-16: 150 ug/kg/min via INTRAVENOUS

## 2019-09-16 MED ORDER — LACTATED RINGERS IV SOLN
INTRAVENOUS | Status: DC
  Administered 2019-09-16: 08:00:00 via INTRAVENOUS

## 2019-09-16 MED ORDER — PROMETHAZINE HCL 25 MG/ML IJ SOLN: 6.2500 mg | INTRAMUSCULAR | Status: DC | PRN

## 2019-09-16 MED ORDER — HYDROCODONE-ACETAMINOPHEN 7.5-325 MG PO TABS: 1.0000 | ORAL_TABLET | Freq: Once | ORAL | Status: DC | PRN

## 2019-09-16 MED ORDER — KETAMINE HCL 50 MG/5ML IJ SOSY
PREFILLED_SYRINGE | INTRAMUSCULAR | Status: AC
  Filled 2019-09-16: qty 5

## 2019-09-16 MED ORDER — CHLORHEXIDINE GLUCONATE CLOTH 2 % EX PADS: 6.0000 | MEDICATED_PAD | Freq: Once | CUTANEOUS | Status: DC

## 2019-09-16 MED ORDER — KETAMINE HCL 10 MG/ML IJ SOLN
INTRAMUSCULAR | Status: DC | PRN
  Administered 2019-09-16: 5 mg via INTRAVENOUS
  Administered 2019-09-16: 15 mg via INTRAVENOUS

## 2019-09-16 MED ORDER — PROPOFOL 10 MG/ML IV BOLUS
INTRAVENOUS | Status: AC
  Filled 2019-09-16: qty 40

## 2019-09-16 MED ORDER — PROPOFOL 10 MG/ML IV BOLUS
INTRAVENOUS | Status: DC | PRN
  Administered 2019-09-16: 25 mg via INTRAVENOUS
  Administered 2019-09-16 (×3): 20 mg via INTRAVENOUS

## 2019-09-16 NOTE — Transfer of Care (Signed)
Immediate Anesthesia Transfer of Care Note  Patient: Stephen Warren  Procedure(s) Performed: COLONOSCOPY WITH PROPOFOL (N/A )  Patient Location: PACU  Anesthesia Type:General  Level of Consciousness: awake  Airway & Oxygen Therapy: Patient Spontanous Breathing  Post-op Assessment: Report given to RN  Post vital signs: Reviewed  Last Vitals:  Vitals Value Taken Time  BP    Temp    Pulse 38 09/16/19 0843  Resp 22 09/16/19 0843  SpO2 96 % 09/16/19 0843  Vitals shown include unvalidated device data.  Last Pain:  Vitals:   09/16/19 0747  TempSrc: Oral  PainSc: 0-No pain         Complications: No apparent anesthesia complications

## 2019-09-16 NOTE — Anesthesia Postprocedure Evaluation (Signed)
Anesthesia Post Note  Patient: Stephen Warren  Procedure(s) Performed: COLONOSCOPY WITH PROPOFOL (N/A )  Patient location during evaluation: PACU Anesthesia Type: General Level of consciousness: awake and alert and oriented Pain management: pain level controlled Vital Signs Assessment: post-procedure vital signs reviewed and stable Respiratory status: spontaneous breathing Cardiovascular status: blood pressure returned to baseline and stable Postop Assessment: no apparent nausea or vomiting Anesthetic complications: no     Last Vitals:  Vitals:   09/16/19 0747  BP: 134/68  Pulse: 92  Temp: 36.9 C  SpO2: 98%    Last Pain:  Vitals:   09/16/19 0747  TempSrc: Oral  PainSc: 0-No pain                 Darienne Belleau

## 2019-09-16 NOTE — Discharge Instructions (Signed)
Colonoscopy Discharge Instructions  Read the instructions outlined below and refer to this sheet in the next few weeks. These discharge instructions provide you with general information on caring for yourself after you leave the hospital. Your doctor may also give you specific instructions. While your treatment has been planned according to the most current medical practices available, unavoidable complications occasionally occur. If you have any problems or questions after discharge, call Dr. Gala Romney at [phone]. ACTIVITY  You may resume your regular activity, but move at a slower pace for the next 24 hours.   Take frequent rest periods for the next 24 hours.   Walking will help get rid of the air and reduce the bloated feeling in your belly (abdomen).   No driving for 24 hours (because of the medicine (anesthesia) used during the test).    Do not sign any important legal documents or operate any machinery for 24 hours (because of the anesthesia used during the test).  NUTRITION  Drink plenty of fluids.   You may resume your normal diet as instructed by your doctor.   Begin with a light meal and progress to your normal diet. Heavy or fried foods are harder to digest and may make you feel sick to your stomach (nauseated).   Avoid alcoholic beverages for 24 hours or as instructed.  MEDICATIONS  You may resume your normal medications unless your doctor tells you otherwise.  WHAT YOU CAN EXPECT TODAY  Some feelings of bloating in the abdomen.   Passage of more gas than usual.   Spotting of blood in your stool or on the toilet paper.  IF YOU HAD POLYPS REMOVED DURING THE COLONOSCOPY:  No aspirin products for 7 days or as instructed.   No alcohol for 7 days or as instructed.   Eat a soft diet for the next 24 hours.  FINDING OUT THE RESULTS OF YOUR TEST Not all test results are available during your visit. If your test results are not back during the visit, make an appointment  with your caregiver to find out the results. Do not assume everything is normal if you have not heard from your caregiver or the medical facility. It is important for you to follow up on all of your test results.  SEEK IMMEDIATE MEDICAL ATTENTION IF:  You have more than a spotting of blood in your stool.   Your belly is swollen (abdominal distention).   You are nauseated or vomiting.   You have a temperature over 101.   You have abdominal pain or discomfort that is severe or gets worse throughout the day.    Diverticulosis information provided  Repeat colonoscopy for surveillance purposes in 5 years.  At patient request, I called Ok Edwards at [phone] and reviewed results.     Diverticulosis  Diverticulosis is a condition that develops when small pouches (diverticula) form in the wall of the large intestine (colon). The colon is where water is absorbed and stool is formed. The pouches form when the inside layer of the colon pushes through weak spots in the outer layers of the colon. You may have a few pouches or many of them. What are the causes? The cause of this condition is not known. What increases the risk? The following factors may make you more likely to develop this condition:  Being older than age 58. Your risk for this condition increases with age. Diverticulosis is rare among people younger than age 39. By age 62, many people have it.  Eating a low-fiber diet.  Having frequent constipation.  Being overweight.  Not getting enough exercise.  Smoking.  Taking over-the-counter pain medicines, like aspirin and ibuprofen.  Having a family history of diverticulosis. What are the signs or symptoms? In most people, there are no symptoms of this condition. If you do have symptoms, they may include:  Bloating.  Cramps in the abdomen.  Constipation or diarrhea.  Pain in the lower left side of the abdomen. How is this diagnosed? This condition is most often  diagnosed during an exam for other colon problems. Because diverticulosis usually has no symptoms, it often cannot be diagnosed independently. This condition may be diagnosed by:  Using a flexible scope to examine the colon (colonoscopy).  Taking an X-ray of the colon after dye has been put into the colon (barium enema).  Doing a CT scan. How is this treated? You may not need treatment for this condition if you have never developed an infection related to diverticulosis. If you have had an infection before, treatment may include:  Eating a high-fiber diet. This may include eating more fruits, vegetables, and grains.  Taking a fiber supplement.  Taking a live bacteria supplement (probiotic).  Taking medicine to relax your colon.  Taking antibiotic medicines. Follow these instructions at home:  Drink 6-8 glasses of water or more each day to prevent constipation.  Try not to strain when you have a bowel movement.  If you have had an infection before: ? Eat more fiber as directed by your health care provider or your diet and nutrition specialist (dietitian). ? Take a fiber supplement or probiotic, if your health care provider approves.  Take over-the-counter and prescription medicines only as told by your health care provider.  If you were prescribed an antibiotic, take it as told by your health care provider. Do not stop taking the antibiotic even if you start to feel better.  Keep all follow-up visits as told by your health care provider. This is important. Contact a health care provider if:  You have pain in your abdomen.  You have bloating.  You have cramps.  You have not had a bowel movement in 3 days. Get help right away if:  Your pain gets worse.  Your bloating becomes very bad.  You have a fever or chills, and your symptoms suddenly get worse.  You vomit.  You have bowel movements that are bloody or black.  You have bleeding from your  rectum. Summary  Diverticulosis is a condition that develops when small pouches (diverticula) form in the wall of the large intestine (colon).  You may have a few pouches or many of them.  This condition is most often diagnosed during an exam for other colon problems.  If you have had an infection related to diverticulosis, treatment may include increasing the fiber in your diet, taking supplements, or taking medicines. This information is not intended to replace advice given to you by your health care provider. Make sure you discuss any questions you have with your health care provider. Document Released: 07/25/2004 Document Revised: 10/10/2017 Document Reviewed: 09/16/2016 Elsevier Patient Education  2020 [place] After These instructions provide you with information about caring for yourself after your procedure. Your health care provider may also give you more specific instructions. Your treatment has been planned according to current medical practices, but problems sometimes occur. Call your health care provider if you have any problems or questions after your  procedure. What can I expect after the procedure? After your procedure, you may:  Feel sleepy for several hours.  Feel clumsy and have poor balance for several hours.  Feel forgetful about what happened after the procedure.  Have poor judgment for several hours.  Feel nauseous or vomit.  Have a sore throat if you had a breathing tube during the procedure. Follow these instructions at home: For at least 24 hours after the procedure:      Have a responsible adult stay with you. It is important to have someone help care for you until you are awake and alert.  Rest as needed.  Do not: ? Participate in activities in which you could fall or become injured. ? Drive. ? Use heavy machinery. ? Drink alcohol. ? Take sleeping pills or medicines that cause drowsiness. ? Make  important decisions or sign legal documents. ? Take care of children on your own. Eating and drinking  Follow the diet that is recommended by your health care provider.  If you vomit, drink water, juice, or soup when you can drink without vomiting.  Make sure you have little or no nausea before eating solid foods. General instructions  Take over-the-counter and prescription medicines only as told by your health care provider.  If you have sleep apnea, surgery and certain medicines can increase your risk for breathing problems. Follow instructions from your health care provider about wearing your sleep device: ? Anytime you are sleeping, including during daytime naps. ? While taking prescription pain medicines, sleeping medicines, or medicines that make you drowsy.  If you smoke, do not smoke without supervision.  Keep all follow-up visits as told by your health care provider. This is important. Contact a health care provider if:  You keep feeling nauseous or you keep vomiting.  You feel light-headed.  You develop a rash.  You have a fever. Get help right away if:  You have trouble breathing. Summary  For several hours after your procedure, you may feel sleepy and have poor judgment.  Have a responsible adult stay with you for at least 24 hours or until you are awake and alert. This information is not intended to replace advice given to you by your health care provider. Make sure you discuss any questions you have with your health care provider. Document Released: 02/18/2016 Document Revised: 01/26/2018 Document Reviewed: 02/18/2016 Elsevier Patient Education  2020 [employer].

## 2019-09-16 NOTE — Anesthesia Preprocedure Evaluation (Signed)
Anesthesia Evaluation  Patient identified by MRN, date of birth, ID band Patient awake    Reviewed: Allergy & Precautions, NPO status , Patient's Chart, lab work & pertinent test results  Airway Mallampati: I  TM Distance: >3 FB Neck ROM: Full    Dental no notable dental hx. (+) Edentulous Upper, Edentulous Lower   Pulmonary shortness of breath, with exertion and Long-Term Oxygen Therapy, asthma , COPD,  COPD inhaler and oxygen dependent, former smoker,  States never uses CPAP , states wasn't ever given one    Pulmonary exam normal breath sounds clear to auscultation       Cardiovascular Exercise Tolerance: Poor negative cardio ROS Normal cardiovascular examII Rhythm:Regular Rate:Normal     Neuro/Psych Anxiety Depression negative neurological ROS  negative psych ROS   GI/Hepatic Neg liver ROS, PUD,   Endo/Other  Morbid obesity  Renal/GU negative Renal ROS  negative genitourinary   Musculoskeletal  (+) Arthritis , Osteoarthritis,    Abdominal   Peds negative pediatric ROS (+)  Hematology negative hematology ROS (+) anemia ,   Anesthesia Other Findings   Reproductive/Obstetrics negative OB ROS                             Anesthesia Physical Anesthesia Plan  ASA: IV  Anesthesia Plan: General   Post-op Pain Management:    Induction: Intravenous  PONV Risk Score and Plan: 2 and TIVA, Ondansetron, Propofol infusion and Treatment may vary due to age or medical condition  Airway Management Planned: Nasal Cannula and Simple Face Mask  Additional Equipment:   Intra-op Plan:   Post-operative Plan: Extubation in OR  Informed Consent: I have reviewed the patients History and Physical, chart, labs and discussed the procedure including the risks, benefits and alternatives for the proposed anesthesia with the patient or authorized representative who has indicated his/her understanding and  acceptance.     Dental advisory given  Plan Discussed with: CRNA  Anesthesia Plan Comments: (Plan Full PPE use  Plan GA with GETA as needed d/w pt -WTP with same after Q&A)        Anesthesia Quick Evaluation

## 2019-09-16 NOTE — Op Note (Signed)
[facility] Patient Name: Stephen Warren Procedure Date: 09/16/2019 7:54 AM MRN: [id number] Date of Birth: [birth year] Attending MD: Norvel Richards , MD CSN: [id number] Age: 63 Admit Type: Outpatient Procedure:                Colonoscopy Indications:              High risk colon cancer surveillance: Personal                            history of colonic polyps Providers:                Norvel Richards, MD, Janeece Riggers, RN, Aram Candela Referring MD:              Medicines:                Propofol per Anesthesia Complications:            No immediate complications. Estimated Blood Loss:     Estimated blood loss: none. Procedure:                Pre-Anesthesia Assessment:                           - Prior to the procedure, a History and Physical                            was performed, and patient medications and                            allergies were reviewed. The patient's tolerance of                            previous anesthesia was also reviewed. The risks                            and benefits of the procedure and the sedation                            options and risks were discussed with the patient.                            All questions were answered, and informed consent                            was obtained. Prior Anticoagulants: The patient has                            taken no previous anticoagulant or antiplatelet                            agents. ASA Grade Assessment: III - A patient with  severe systemic disease. After reviewing the risks                            and benefits, the patient was deemed in                            satisfactory condition to undergo the procedure.                           After obtaining informed consent, the colonoscope                            was passed under direct vision. Throughout the                            procedure, the patient's blood  pressure, pulse, and                            oxygen saturations were monitored continuously. The                            [id number] scope was introduced through                            the anus and advanced to the the cecum, identified                            by appendiceal orifice and ileocecal valve. The                            colonoscopy was performed without difficulty. The                            patient tolerated the procedure well. The quality                            of the bowel preparation was adequate. Scope In: 8:25:18 AM Scope Out: [id number] AM Scope Withdrawal Time: 0 hours 6 minutes 28 seconds  Total Procedure Duration: 0 hours 11 minutes 1 second  Findings:      The perianal and digital rectal examinations were normal.      A few small-mouthed diverticula were found in the ileocecal valve.      The exam was otherwise without abnormality on direct and retroflexion       views. 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                           - The examination was otherwise normal on direct                            and retroflexion views.                           -  No specimens collected. Moderate Sedation:      Moderate (conscious) sedation was personally administered by an       anesthesia professional. The following parameters were monitored: oxygen       saturation, heart rate, blood pressure, respiratory rate, EKG, adequacy       of pulmonary ventilation, and response to care. Recommendation:           - Patient has a contact number available for                            emergencies. The signs and symptoms of potential                            delayed complications were discussed with the                            patient. Return to normal activities tomorrow.                            Written discharge instructions were provided to the                            patient.                           -  Resume previous diet.                           - Continue present medications.                           - Repeat colonoscopy in 5 years for surveillance.                           - Return to GI office PRN. Procedure Code(s):        --- Professional ---                           [phone], Colonoscopy, flexible; diagnostic, including                            collection of specimen(s) by brushing or washing,                            when performed (separate procedure) Diagnosis Code(s):        --- Professional ---                           Z86.010, Personal history of colonic polyps                           K57.30, Diverticulosis of large intestine without                            perforation or abscess without bleeding CPT copyright 2019 American Medical Association. All rights reserved. The codes documented in this report are  preliminary and upon coder review may  be revised to meet current compliance requirements. Stephen Estimable. Rourk, MD Norvel Richards, MD 09/16/2019 8:46:28 AM This report has been signed electronically. Number of Addenda: 0

## 2019-09-16 NOTE — OR Nursing (Signed)
Patient arrived by [facility]  ,  Stated he plans to go back home via [facility],  He has nobody to sign the responsiblitiy for him to be able to be discharged home. Procedure was cancelled .

## 2019-09-16 NOTE — H&P (Signed)
@LOGO @   Primary Care Physician:  [name], MD Primary Gastroenterologist:  Dr. Gala Romney Pre-Procedure History & Physical: HPI:  Stephen Warren is a 63 y.o. male here for 6 surveillance colonoscopy.  History of multiple colonic adenomas removed 2016 1 greater than 1 cm in dimensions.  Currently no GI symptoms.  Past Medical History:  Diagnosis Date  . ADD (attention deficit disorder)   . Arthritis   . Asthma   . Back pain   . COPD (chronic obstructive pulmonary disease) ([place])   . Depression   . 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 Hemorrhoids   . History of kidney stones   . Insomnia   . Knee pain   . Panic attacks     Past Surgical History:  Procedure Laterality Date  . BIOPSY  10/30/2015   Procedure: BIOPSY;  Surgeon: Daneil Dolin, MD;  [facility];  Service: Endoscopy;;  ileocecal valve ulcer  . BIOPSY  07/04/2019   Procedure: BIOPSY;  Surgeon: Danie Binder, MD;  Location: [facility];  Service: Endoscopy;;  gastric  . COLONOSCOPY WITH PROPOFOL N/A 10/30/2015   Procedure: COLONOSCOPY WITH PROPOFOL;  Surgeon: Daneil Dolin, MD;  Location: [facility];  Service: Endoscopy;  Laterality: N/A;  1000   . ESOPHAGOGASTRODUODENOSCOPY (EGD) WITH PROPOFOL N/A 07/04/2019   Procedure: ESOPHAGOGASTRODUODENOSCOPY (EGD) WITH PROPOFOL;  Surgeon: Danie Binder, MD;  Location: [facility];  Service: Endoscopy;  Laterality: N/A;  . NASAL SEPTOPLASTY W/ TURBINOPLASTY  07/01/2018  . NASAL SEPTOPLASTY W/ TURBINOPLASTY Bilateral 07/01/2018   Procedure: NASAL SEPTOPLASTY WITH BILATERAL TURBINATE REDUCTION;  Surgeon: Leta Baptist, MD;  Location: [place];  Service: ENT;  Laterality: Bilateral;  . None to date  10/16/15  . POLYPECTOMY  10/30/2015   Procedure: POLYPECTOMY;  Surgeon: Daneil Dolin, MD;  Location: [facility];  Service: Endoscopy;;  sigmoid polypectomy  x3    Prior to Admission medications   Medication Sig Start Date End  Date Taking? Authorizing Provider  albuterol (PROVENTIL) (2.5 MG/3ML) 0.083% nebulizer solution Take 2.5 mg by nebulization 4 (four) times daily.    Yes [provider]  baclofen (LIORESAL) 20 MG tablet Take 20 mg by mouth daily.  06/10/18  Yes [provider]  citalopram (CELEXA) 20 MG tablet Take 20 mg by mouth daily.   Yes [provider]  hydrocortisone cream 1 % Apply 1 application topically 2 (two) times daily as needed for itching.   Yes [provider]  Multiple Vitamin (MULTIVITAMIN WITH MINERALS) TABS tablet Take 1 tablet by mouth daily.    Yes [provider]  PROAIR HFA 108 (90 Base) MCG/ACT inhaler Inhale 1-2 puffs into the lungs 4 (four) times daily as needed for wheezing or shortness of breath.  06/10/18  Yes [provider]  SYMBICORT 160-4.5 MCG/ACT inhaler Inhale 1 puff into the lungs 2 (two) times daily. 06/11/18  Yes [provider]  tiotropium (SPIRIVA) 18 MCG inhalation capsule Place 18 mcg into inhaler and inhale daily.   Yes [provider]  traZODone (DESYREL) 100 MG tablet Take 200 mg by mouth at bedtime as needed for sleep.  03/30/18  Yes [provider]  acetaminophen (TYLENOL) 500 MG tablet Take 2 tablets (1,000 mg total) by mouth every 6 (six) hours as needed for mild pain or moderate pain. Patient not taking: Reported on 09/07/2019 07/07/19 07/06/20  Heath Lark D, DO  lidocaine ([place])  5 % Place 1 patch onto the skin every 12 (twelve) hours. Remove & Discard patch within 12 hours or as directed by MD Patient not taking: Reported on 09/07/2019 07/07/19 07/06/20  Heath Lark D, DO  pantoprazole (PROTONIX) 40 MG tablet Take 1 tablet (40 mg total) by mouth 2 (two) times daily before a meal. Patient not taking: Reported on 09/07/2019 07/07/19 08/06/19  Manuella Ghazi, Pratik D, DO  pantoprazole (PROTONIX) 40 MG tablet Take 1 tablet (40 mg total) by mouth daily. Patient not taking: Reported on 09/07/2019  08/07/19 08/06/20  Heath Lark D, DO    Allergies as of 07/12/2019 - Review Complete 07/04/2019  Allergen Reaction Noted  . Other Anaphylaxis 10/25/2015  . Iodine  10/16/2015  . Shellfish allergy Swelling 10/16/2015    Family History  Problem Relation Age of Onset  . Colon cancer Neg Hx   . Colon polyps Neg Hx     Social History   Socioeconomic History  . Marital status: Single    Spouse name: Not on file  . Number of children: Not on file  . Years of education: Not on file  . Highest education level: Not on file  Occupational History  . Not on file  Social Needs  . Financial resource strain: Not on file  . Food insecurity    Worry: Not on file    Inability: Not on file  . Transportation needs    Medical: Not on file    Non-medical: Not on file  Tobacco Use  . Smoking status: Former Smoker    Packs/day: 1.00    Years: 45.00    Pack years: 45.00    Types: Cigarettes    Quit date: 12/25/2017    Years since quitting: 1.7  . Smokeless tobacco: Never Used  . Tobacco comment: Quit this year, now on oxygen.   Substance and Sexual Activity  . 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 Drug use: No  . Sexual activity: Not Currently  Lifestyle  . Physical activity    Days per week: Not on file    Minutes per session: Not on file  . Stress: Not on file  Relationships  . Social Herbalist on phone: Not on file    Gets together: Not on file    Attends religious service: Not on file    Active member of club or organization: Not on file    Attends meetings of clubs or organizations: Not on file    Relationship status: Not on file  . Intimate partner violence    Fear of current or ex partner: Not on file    Emotionally abused: Not on file    Physically abused: Not on file    Forced sexual activity: Not on file  Other Topics Concern  . Not on file  Social  History Narrative   NO WORK IN YEARS DUE TO DISABILITY: BACK, KNEES, COPD/ASTHMA. 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 SPENDS FREE TIME: COMPUTER GAMES.    Review of Systems: See HPI, otherwise negative ROS  Physical Exam: BP 134/68   Pulse 92   Temp 98.4 F (36.9 C) (Oral)   Ht 6' (1.829 m)   Wt (!) 142.9 kg   SpO2 98%   BMI 42.72 kg/m  General:   Alert,  Well-developed, well-nourished, pleasant and cooperative in NAD Neck:  Supple; no masses or  thyromegaly. No significant cervical adenopathy. Lungs:  Clear throughout to auscultation.   No wheezes, crackles, or rhonchi. No acute distress. Heart:  Regular rate and rhythm; no murmurs, clicks, rubs,  or gallops. Abdomen: Non-distended, normal bowel sounds.  Soft and nontender without appreciable mass or hepatosplenomegaly.  Pulses:  Normal pulses noted. Extremities:  Without clubbing or edema.  Impression/Plan: 63 year old gentleman with a history of multiple colonic adenomas removed 2016; here for surveillance colonoscopy.  The risks, benefits, limitations, alternatives and imponderables have been reviewed with the patient. Questions have been answered. All parties are agreeable.      Notice: This dictation was prepared with Dragon dictation along with smaller phrase technology. Any transcriptional errors that result from this process are unintentional and may not be corrected upon review.

## 2019-09-21 ENCOUNTER — Encounter (HOSPITAL_COMMUNITY): Payer: Self-pay | Admitting: Internal Medicine

## 2019-12-15 ENCOUNTER — Encounter: Payer: Self-pay | Admitting: Internal Medicine

## 2020-05-11 ENCOUNTER — Other Ambulatory Visit: Payer: Self-pay

## 2020-05-11 ENCOUNTER — Ambulatory Visit (INDEPENDENT_AMBULATORY_CARE_PROVIDER_SITE_OTHER): Payer: Medicaid Other | Admitting: Internal Medicine

## 2020-05-11 ENCOUNTER — Encounter: Payer: Self-pay | Admitting: Internal Medicine

## 2020-05-11 DIAGNOSIS — J9611 Chronic respiratory failure with hypoxia: Secondary | ICD-10-CM

## 2020-05-11 DIAGNOSIS — J449 Chronic obstructive pulmonary disease, unspecified: Secondary | ICD-10-CM | POA: Diagnosis not present

## 2020-05-11 MED ORDER — BREZTRI AEROSPHERE 160-9-4.8 MCG/ACT IN AERO: 2.0000 | INHALATION_SPRAY | Freq: Two times a day (BID) | RESPIRATORY_TRACT | 11 refills | Status: DC

## 2020-05-11 MED ORDER — BREZTRI AEROSPHERE 160-9-4.8 MCG/ACT IN AERO: 2.0000 | INHALATION_SPRAY | Freq: Two times a day (BID) | RESPIRATORY_TRACT | 0 refills | Status: DC

## 2020-05-11 NOTE — Progress Notes (Signed)
Stephen Warren, male    DOB: March 06, 1956, 64 y.o.   MRN: [id number]   Brief patient profile:  64 yowm quit smoking 2017 with h/o asthma all his asthma could never play sports using inhalers and nebs daily and worse 2006 met criteria for disability 2012 and arrived in [place] since 2014  And breathing worse even when quit smoking at wt  270 and followed by Luan Pulling so referred to pulmonary clinic 05/11/2020 by Dr   Luan Pulling .      History of Present Illness  05/11/2020  Pulmonary/ 1st office eval/Armend Hochstatter  Chief Complaint  Patient presents with  . Pulmonary Consult    Referred by Dr Legrand Rams. Former Dr Luan Pulling pt.    Dyspnea:  Able to do 10 - 15 minutes in cooler air Cough: non Sleep: in recliner 30 degrees  SABA use: using hfa and neb 4 x daily  02 prn daytime  Up to 4lpm pulsed and sometimes checks sats and finds them usually 88 or above but not usually checking with ex   No obvious day to day or daytime variability or assoc excess/ purulent sputum or mucus plugs or hemoptysis or cp or chest tightness,   or overt sinus or hb symptoms.   Sleeping as above without nocturnal  or early am exacerbation  of respiratory  c/o's or need for noct saba. Also denies any obvious fluctuation of symptoms with weather or environmental changes or other aggravating or alleviating factors except as outlined above   No unusual exposure hx or h/o childhood pna or knowledge of premature birth.  Current Allergies, Complete Past Medical History, Past Surgical History, Family History, and Social History were reviewed in [employer] record.  ROS  The following are not active complaints unless bolded Hoarseness, sore throat, dysphagia / globus sensation , dental problems, itching, sneezing,  nasal congestion or discharge of excess mucus or purulent secretions, ear ache,   fever, chills, sweats, unintended wt loss or wt gain, classically pleuritic or exertional cp,  orthopnea pnd or arm/hand swelling  or  leg swelling, presyncope, palpitations, abdominal pain, anorexia, nausea, vomiting, diarrhea  or change in bowel habits or change in bladder habits, change in stools or change in urine, dysuria, hematuria,  rash, arthralgias, visual complaints, headache, numbness, weakness or ataxia or problems with walking or coordination,  change in mood or  memory.                Past Medical History:  Diagnosis Date  . ADD (attention deficit disorder)   . Arthritis   . Asthma   . Back pain   . COPD (chronic obstructive pulmonary disease) ([place])   . Depression   . 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 Hemorrhoids   . History of kidney stones   . Insomnia   . Knee pain   . Panic attacks     Outpatient Medications Prior to Visit  Medication Sig Dispense Refill  . albuterol (PROVENTIL) (2.5 MG/3ML) 0.083% nebulizer solution Take 2.5 mg by nebulization 4 (four) times daily.     . baclofen (LIORESAL) 20 MG tablet Take 20 mg by mouth daily.   4  . citalopram (CELEXA) 20 MG tablet Take 20 mg by mouth daily.    Marland Kitchen PROAIR HFA 108 (90 Base) MCG/ACT inhaler Inhale 1-2 puffs into the lungs 4 (four) times daily as needed for wheezing or shortness of breath.   3  . SYMBICORT  160-4.5 MCG/ACT inhaler Inhale 1 puff into the lungs 2 (two) times daily.  3  . tiotropium (SPIRIVA) 18 MCG inhalation capsule Place 18 mcg into inhaler and inhale daily.    . hydrocortisone cream 1 % Apply 1 application topically 2 (two) times daily as needed for itching.    . Multiple Vitamin (MULTIVITAMIN WITH MINERALS) TABS tablet Take 1 tablet by mouth daily.     . traZODone (DESYREL) 100 MG tablet Take 200 mg by mouth at bedtime as needed for sleep.   5   No facility-administered medications prior to visit.     Objective:     BP 120/80 (BP Location: Left Arm, Cuff Size: Normal)   Pulse 97   Ht '6\' 1"'  (1.854 m)   Wt (!) 320 lb (145.2 kg)   SpO2 96% Comment: 4lpm pulsed  BMI 42.22  kg/m   SpO2: 96 % (4lpm pulsed) O2 Type: Pulse O2 O2 Flow Rate (L/min): 4 L/min   Obese slt hoarse wm classic psuedowheeze/ better with plm   HEENT : pt wearing mask not removed for exam due to covid -19 concerns.    NECK :  without JVD/Nodes/TM/ nl carotid upstrokes bilaterally   LUNGS: no acc muscle use,  Nl contour chest which is clear to A and P bilaterally with distant BS  but  without cough on insp or exp maneuvers   CV:  RRR  no s3 or murmur or increase in P2, and  Trace pitting edema  both LE's  ABD:  Massively obese/ soft and nontender with nl inspiratory excursion in the supine position. No bruits or organomegaly appreciated, bowel sounds nl  MS:  Nl gait/ ext warm without deformities, calf tenderness, cyanosis or clubbing No obvious joint restrictions   SKIN: warm and dry without lesions    NEURO:  alert, approp, nl sensorium with  no motor or cerebellar deficits apparent.         Assessment   COPD GOLD III  Quit smoking 2017  - PFT's  05/11/2020  FEV1 1.32 (33 % ) ratio 0.42  p 25 % improvement from saba p ? prior to study with DLCO  50.57 (69%) corrects to 3.43 (83%)  for alv volume and FV curve classic exp curvature  - 05/11/2020  After extensive coaching inhaler device,  effectiveness =    75% from a baseline of 50 % (short ti, double trigger)   Historically more difficult to control despite stopping smoking  DDX of  difficult airways management almost all start with A and  include Adherence, Ace Inhibitors, Acid Reflux, Active Sinus Disease, Alpha 1 Antitripsin deficiency, Anxiety masquerading as Airways dz,  ABPA,  Allergy(esp in young), Aspiration (esp in elderly), Adverse effects of meds,  Active smoking or vaping, A bunch of PE's (a small clot burden can't cause this syndrome unless there is already severe underlying pulm or vascular dz with poor reserve) plus two Bs  = Bronchiectasis and Beta blocker use..and one C= CHF  Adherence is always the initial  "prime suspect" and is a multilayered concern that requires a "trust but verify" approach in every patient - starting with knowing how to use medications, especially inhalers, correctly, keeping up with refills and understanding the fundamental difference between maintenance and prns vs those medications only taken for a very short course and then stopped and not refilled.  - see hfa teachin g - advised  with all meds in hand using a trust but verify approach to confirm accurate  Medication  Reconciliation The principal here is that until we are certain that the  patients are doing what we've asked, it makes no sense to ask them to do more.   ? Adverse effects of dpi spiriva > change lama to breztri and d/ symbicort   ? Acid (or non-acid) GERD > always difficult to exclude as up to 75% of pts in some series report no assoc GI/ Heartburn symptoms> rec nex consider add max (24h)  acid suppression and diet restrictions   ? Anxiety/ obesity/ deconditioning  > usually at the bottom of this list of usual suspects but should be much higher on this pt's based on H and P and note already on psychotropics and may interfere with adherence and also interpretation of response or lack thereof to symptom management which can be quite subjective.  Allergy  asthma with overuse of saba   I spent extra time with pt today reviewing appropriate use of albuterol for prn use on exertion with the following points: 1) saba is for relief of sob that does not improve by walking a slower pace or resting but rather if the pt does not improve after trying this first. 2) If the pt is convinced, as many are, that saba helps recover from activity faster then it's easy to tell if this is the case by re-challenging : ie stop, take the inhaler, then p 5 minutes try the exact same activity (intensity of workload) that just caused the symptoms and see if they are substantially diminished or not after saba 3) if there is an activity that  reproducibly causes the symptoms, try the saba 15 min before the activity on alternate days   If in fact the saba really does help, then fine to continue to use it prn but advised may need to look closer at the maintenance regimen being used to achieve better control of airways disease with exertion.    Chronic respiratory failure with hypoxia (HCC) sats at rest 94% RA but using up to 4lpm with ex / not checking sats as  Of 05/11/2020   Advised:  Make sure you check your oxygen saturations at highest level of activity to be sure it stays over 90% and adjust upward to maintain this level if needed but remember to turn it back to previous settings when you stop (to conserve your supply).   Needs ONO RA       Morbid (severe) obesity due to excess calories (HCC) Body mass index is 42.22 kg/m.  -    Contributing to gerd risk/ doe/reviewed the need and the process to achieve and maintain neg calorie balance > defer f/u primary care including intermittently monitoring thyroid status         Each maintenance medication was reviewed in detail including emphasizing most importantly the difference between maintenance and prns and under what circumstances the prns are to be triggered using an action plan format where appropriate.  Total time for H and P, chart review, counseling, teaching device and generating customized AVS unique to this office visit / charting = 45 min         Christinia Gully, MD 05/11/2020

## 2020-05-11 NOTE — Assessment & Plan Note (Signed)
Quit smoking 2017  - PFT's  05/11/2020  FEV1 1.32 (33 % ) ratio 0.42  p 25 % improvement from saba p ? prior to study with DLCO  50.57 (69%) corrects to 3.43 (83%)  for alv volume and FV curve classic exp curvature  - 05/11/2020  After extensive coaching inhaler device,  effectiveness =    75% from a baseline of 50 % (short ti, double trigger)   Historically more difficult to control despite stopping smoking  DDX of  difficult airways management almost all start with A and  include Adherence, Ace Inhibitors, Acid Reflux, Active Sinus Disease, Alpha 1 Antitripsin deficiency, Anxiety masquerading as Airways dz,  ABPA,  Allergy(esp in young), Aspiration (esp in elderly), Adverse effects of meds,  Active smoking or vaping, A bunch of PE's (a small clot burden can't cause this syndrome unless there is already severe underlying pulm or vascular dz with poor reserve) plus two Bs  = Bronchiectasis and Beta blocker use..and one C= CHF  Adherence is always the initial "prime suspect" and is a multilayered concern that requires a "trust but verify" approach in every patient - starting with knowing how to use medications, especially inhalers, correctly, keeping up with refills and understanding the fundamental difference between maintenance and prns vs those medications only taken for a very short course and then stopped and not refilled.  - see hfa teachin g - advised  with all meds in hand using a trust but verify approach to confirm accurate Medication  Reconciliation The principal here is that until we are certain that the  patients are doing what we've asked, it makes no sense to ask them to do more.   ? Adverse effects of dpi spiriva > change lama to breztri and d/ symbicort   ? Acid (or non-acid) GERD > always difficult to exclude as up to 75% of pts in some series report no assoc GI/ Heartburn symptoms> rec nex consider add max (24h)  acid suppression and diet restrictions   ? Anxiety/ obesity/  deconditioning  > usually at the bottom of this list of usual suspects but should be much higher on this pt's based on H and P and note already on psychotropics and may interfere with adherence and also interpretation of response or lack thereof to symptom management which can be quite subjective.  Allergy  asthma with overuse of saba   I spent extra time with pt today reviewing appropriate use of albuterol for prn use on exertion with the following points: 1) saba is for relief of sob that does not improve by walking a slower pace or resting but rather if the pt does not improve after trying this first. 2) If the pt is convinced, as many are, that saba helps recover from activity faster then it's easy to tell if this is the case by re-challenging : ie stop, take the inhaler, then p 5 minutes try the exact same activity (intensity of workload) that just caused the symptoms and see if they are substantially diminished or not after saba 3) if there is an activity that reproducibly causes the symptoms, try the saba 15 min before the activity on alternate days   If in fact the saba really does help, then fine to continue to use it prn but advised may need to look closer at the maintenance regimen being used to achieve better control of airways disease with exertion.

## 2020-05-11 NOTE — Patient Instructions (Addendum)
We will order Overnight pulse oximetry on Room air and let you know how you did   Make sure you check your oxygen saturations at highest level of activity to be sure it stays over 90% and adjust upward to maintain this level if needed but remember to turn it back to previous settings when you stop (to conserve your supply).   Plan A = Automatic = Always=    breztri Take 2 puffs first thing in am and then another 2 puffs about 12 hours later.   Work on inhaler technique:  relax and gently blow all the way out then take a nice smooth deep breath back in, triggering the inhaler at same time you start breathing in.  Hold for up to 5 seconds if you can. Blow out thru nose. Rinse and gargle with water when done   Plan B = Backup (to supplement plan A, not to replace it) Only use your albuterol inhaler as a rescue medication to be used if you can't catch your breath by resting or doing a relaxed purse lip breathing pattern.  - The less you use it, the better it will work when you need it. - Ok to use the inhaler up to 2 puffs  every 4 hours if you must but call for appointment if use goes up over your usual need - Don't leave home without it !!  (think of it like the spare tire for your car)   Plan C = Crisis (instead of Plan B but only if Plan B stops working) - only use your albuterol nebulizer if you first try Plan B and it fails to help > ok to use the nebulizer up to every 4 hours but if start needing it regularly call for immediate appointment   Try albuterol 15 min before an activity that you know would make you short of breath and see if it makes any difference and if makes none then don't take it after activity unless you can't catch your breath.        Please schedule a follow up visit in 3 months but call sooner if needed

## 2020-05-11 NOTE — Assessment & Plan Note (Signed)
sats at rest 94% RA but using up to 4lpm with ex / not checking sats as  Of 05/11/2020   Advised:  Make sure you check your oxygen saturations at highest level of activity to be sure it stays over 90% and adjust upward to maintain this level if needed but remember to turn it back to previous settings when you stop (to conserve your supply).   Needs ONO RA

## 2020-05-11 NOTE — Assessment & Plan Note (Signed)
Body mass index is 42.22 kg/m.  -    Contributing to gerd risk/ doe/reviewed the need and the process to achieve and maintain neg calorie balance > defer f/u primary care including intermittently monitoring thyroid status            Each maintenance medication was reviewed in detail including emphasizing most importantly the difference between maintenance and prns and under what circumstances the prns are to be triggered using an action plan format where appropriate.  Total time for H and P, chart review, counseling, teaching device and generating customized AVS unique to this office visit / charting = 45 min

## 2020-05-11 NOTE — Addendum Note (Signed)
Addended by: Rosana Berger on: 05/11/2020 04:37 PM   Modules accepted: Orders

## 2020-05-17 ENCOUNTER — Encounter: Payer: Self-pay | Admitting: Internal Medicine

## 2020-06-05 ENCOUNTER — Telehealth: Payer: Self-pay | Admitting: Internal Medicine

## 2020-06-05 MED ORDER — BREZTRI AEROSPHERE 160-9-4.8 MCG/ACT IN AERO: 2.0000 | INHALATION_SPRAY | Freq: Two times a day (BID) | RESPIRATORY_TRACT | 11 refills | Status: DC

## 2020-06-05 NOTE — Telephone Encounter (Signed)
breztri was refilled  Left detailed msg letting pt know that this was done

## 2020-06-09 ENCOUNTER — Telehealth: Payer: Self-pay | Admitting: Internal Medicine

## 2020-06-09 NOTE — Telephone Encounter (Signed)
Pt has tried/failed dulera, symbicort, spiriva, incruse  Called pt to see if he knew if a PA was needed for the [facility] and he was not sure. Stated to him that I would call pharmacy to get more info and he verbalized understanding.  Called pt's pharmacy and spoke with Joellen Jersey to see if pt's med was requiring a PA and she stated it was.   PA has been initiated via CMM:  Medication name and strength: Breztri Provider: [place]: [facility] Patient insurance ID: [id number] Phone: [phone] Fax: [phone]  Was the PA started on CMM?  yes If yes, please enter the Key: [id number] Timeframe for approval/denial: CMM states that determination should be received in about 5 days via fax. Also might need to call [employer] to check status update at [phone].  Will leave encounter open in triage while we wait for update.

## 2020-06-13 NOTE — Telephone Encounter (Signed)
Checked Cover My Meds. PA is still pending at this time.

## 2020-06-13 NOTE — Telephone Encounter (Signed)
Called [place] [employer] to check status of PA that was initiated. PA has been approved from 06/09/20-06/09/21.  Called and spoke with pt letting him know this info and also called pharmacy. Nothing further needed.

## 2020-06-13 NOTE — Telephone Encounter (Signed)
Patient called following up on inhaler.  Pt aware the PA is still pending with CoverMyMeds.

## 2020-08-31 ENCOUNTER — Ambulatory Visit (INDEPENDENT_AMBULATORY_CARE_PROVIDER_SITE_OTHER): Payer: Medicaid Other | Admitting: Internal Medicine

## 2020-08-31 ENCOUNTER — Other Ambulatory Visit: Payer: Self-pay

## 2020-08-31 ENCOUNTER — Encounter: Payer: Self-pay | Admitting: Internal Medicine

## 2020-08-31 DIAGNOSIS — J449 Chronic obstructive pulmonary disease, unspecified: Secondary | ICD-10-CM | POA: Diagnosis not present

## 2020-08-31 DIAGNOSIS — J9611 Chronic respiratory failure with hypoxia: Secondary | ICD-10-CM

## 2020-08-31 DIAGNOSIS — R058 Other specified cough: Secondary | ICD-10-CM | POA: Diagnosis not present

## 2020-08-31 MED ORDER — PANTOPRAZOLE SODIUM 40 MG PO TBEC: 40.0000 mg | DELAYED_RELEASE_TABLET | Freq: Every day | ORAL | 2 refills | Status: DC

## 2020-08-31 MED ORDER — PREDNISONE 10 MG PO TABS: ORAL_TABLET | ORAL | 0 refills | Status: DC

## 2020-08-31 NOTE — Patient Instructions (Addendum)
GERD (REFLUX)  is an extremely common cause of respiratory symptoms just like yours , many times with no obvious heartburn at all.    It can be treated with medication, but also with lifestyle changes including elevation of the head of your bed (ideally with 6 -8inch blocks under the headboard of your bed),  Smoking cessation, avoidance of late meals, excessive alcohol, and avoid fatty foods, chocolate, peppermint, colas, red wine, and acidic juices such as orange juice.  NO MINT OR MENTHOL PRODUCTS SO NO COUGH DROPS  USE SUGARLESS CANDY INSTEAD (Jolley ranchers or Stover's or Life Savers) or even ice chips will also do - the key is to swallow to prevent all throat clearing. NO OIL BASED VITAMINS - use powdered substitutes.  Avoid fish oil when coughing  Protonix 40 mg Take 30-60 min before first meal of the day   Prednisone 10 mg take  4 each am x 2 days,   2 each am x 2 days,  1 each am x 2 days and stop (don't take until after blood test)   Please remember to go to the lab and x-ray department at [facility]   for your tests - we will call you with the results when they are available.  Try albuterol 15 min before an activity that you know would make you short of breath and see if it makes any difference and if makes none then don't take it after activity unless you can't catch your breath.      Make sure you check your oxygen saturations at highest level of activity to be sure it stays over 90% and adjust  02 flow upward to maintain this level if needed but remember to turn it back to previous settings when you stop (to conserve your supply).   Please schedule a follow up visit in 3 months but call sooner if needed

## 2020-08-31 NOTE — Progress Notes (Signed)
Stephen Warren, male    DOB: 08-Jan-1956, 64 y.o.   MRN: [id number]   Brief patient profile:  64 yowm quit smoking 2017 with h/o asthma all his asthma could never play sports using inhalers and nebs daily and worse 2006 met criteria for disability 2012 and arrived in [place] since 2014  And breathing worse even when quit smoking at wt  270 and followed by Luan Pulling so referred to pulmonary clinic 05/11/2020 by Dr   Luan Pulling .      History of Present Illness  05/11/2020  Pulmonary/ 1st office eval/Stephen Warren on symbicort and spiriva Chief Complaint  Patient presents with  . Pulmonary Consult    Referred by Dr Legrand Rams. Former Dr Luan Pulling pt.    Dyspnea:  Able to do 10 - 15 minutes in cooler air Cough: non Sleep: in recliner 30 degrees  SABA use: using hfa and neb 4 x daily  02 prn daytime  Up to 4lpm pulsed and sometimes checks sats and finds them usually 88 or above but not usually checking with ex  rec We will order Overnight pulse oximetry on Room air and let you know how you did  Make sure you check your oxygen saturations at highest level of activity Plan A = Automatic = Always=    breztri Take 2 puffs first thing in am and then another 2 puffs about 12 hours later.  Work on inhaler technique:  Plan B = Backup (to supplement plan A, not to replace it) Only use your albuterol inhaler as a rescue medication Plan C = Crisis (instead of Plan B but only if Plan B stops working) - only use your albuterol nebulizer if you first try Plan B Try albuterol 15 min before an activity that you know would make you short of breath and see if it makes any difference and if makes none then don't take it after activity unless you can't catch your breath.        08/31/2020  f/u ov/[place] office/Stephen Warren re: GOLD III / breztri maint/ 02 dep hs and ex  Chief Complaint  Patient presents with  . Follow-up    Breathing has been worse for the past month. He is SOB walking accross the room.   Dyspnea:  In cool weather  does better still not having to stop walking every 1.5 aisle  150 ft to mb is flat stop 3/4 never checks 02 - doesn't use it either unless "gives out"  Cough:  Dry cough daytime  Sleeping: on side bed flat/ 2 pillows  SABA use: using albuterol hfa qid  02: sitting still no 02  /2 liters at hs and pulsing on 2lpm with ex but not monitoring as rec  Worse for the last month with sneezing/ dry cough daytime    No obvious day to day or daytime variability or assoc excess/ purulent sputum or mucus plugs or hemoptysis or cp or chest tightness, subjective wheeze or overt sinus or hb symptoms.   Sleeping  without nocturnal  or early am exacerbation  of respiratory  c/o's or need for noct saba. Also denies any obvious fluctuation of symptoms with weather or environmental changes or other aggravating or alleviating factors except as outlined above   No unusual exposure hx or h/o childhood pna  or knowledge of premature birth.  Current Allergies, Complete Past Medical History, Past Surgical History, Family History, and Social History were reviewed in [facility] record.  ROS  The following are not active  complaints unless bolded Hoarseness, sore throat, dysphagia, dental problems, itching, sneezing,  nasal congestion or discharge of excess mucus or purulent secretions, ear ache,   fever, chills, sweats, unintended wt loss or wt gain, classically pleuritic or exertional cp,  orthopnea pnd or arm/hand swelling  or leg swelling, presyncope, palpitations, abdominal pain, anorexia, nausea, vomiting, diarrhea  or change in bowel habits or change in bladder habits, change in stools or change in urine, dysuria, hematuria,  rash, arthralgias, visual complaints, headache, numbness, weakness or ataxia or problems with walking or coordination,  change in mood or  memory.        Current Meds  Medication Sig  . albuterol (PROVENTIL) (2.5 MG/3ML) 0.083% nebulizer solution Take 2.5 mg by  nebulization 4 (four) times daily.   . baclofen (LIORESAL) 20 MG tablet Take 20 mg by mouth daily.   . Budeson-Glycopyrrol-Formoterol (BREZTRI AEROSPHERE) 160-9-4.8 MCG/ACT AERO Inhale 2 puffs into the lungs 2 (two) times daily.  . citalopram (CELEXA) 20 MG tablet Take 20 mg by mouth daily.  . furosemide (LASIX) 20 MG tablet Take 20 mg by mouth daily as needed.  Marland Kitchen PROAIR HFA 108 (90 Base) MCG/ACT inhaler Inhale 1-2 puffs into the lungs 4 (four) times daily as needed for wheezing or shortness of breath.              Past Medical History:  Diagnosis Date  . ADD (attention deficit disorder)   . Arthritis   . Asthma   . Back pain   . COPD (chronic obstructive pulmonary disease) ([place])   . Depression   . 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 Hemorrhoids   . History of kidney stones   . Insomnia   . Knee pain   . Panic attacks     Outpatient Medications Prior to Visit  Medication Sig Dispense Refill  . albuterol (PROVENTIL) (2.5 MG/3ML) 0.083% nebulizer solution Take 2.5 mg by nebulization 4 (four) times daily.     . baclofen (LIORESAL) 20 MG tablet Take 20 mg by mouth daily.   4  . citalopram (CELEXA) 20 MG tablet Take 20 mg by mouth daily.    Marland Kitchen PROAIR HFA 108 (90 Base) MCG/ACT inhaler Inhale 1-2 puffs into the lungs 4 (four) times daily as needed for wheezing or shortness of breath.   3  . SYMBICORT 160-4.5 MCG/ACT inhaler Inhale 1 puff into the lungs 2 (two) times daily.  3  . tiotropium (SPIRIVA) 18 MCG inhalation capsule Place 18 mcg into inhaler and inhale daily.    . hydrocortisone cream 1 % Apply 1 application topically 2 (two) times daily as needed for itching.    . Multiple Vitamin (MULTIVITAMIN WITH MINERALS) TABS tablet Take 1 tablet by mouth daily.     . traZODone (DESYREL) 100 MG tablet Take 200 mg by mouth at bedtime as needed for sleep.   5   No facility-administered medications prior to visit.     Objective:     Wt  Readings from Last 3 Encounters:  05/11/20 (!) 320 lb (145.2 kg)  09/16/19 (!) 315 lb (142.9 kg)  07/03/19 (!) 324 lb 8 oz (147.2 kg)     Vital signs reviewed - Note on arrival 08/31/2020  02 sats  97% on 2lpm pulsed         amb obese wm with classic pseudowheeze better with plm   HEENT : pt wearing mask not removed for exam due to covid -  19 concerns.   NECK :  without JVD/Nodes/TM/ nl carotid upstrokes bilaterally   LUNGS: no acc muscle use,  Min barrel  contour chest wall with bilateral  slightly decreased bs s audible wheeze and  without cough on insp or exp maneuvers and min  Hyperresonant  to  percussion bilaterally     CV:  RRR  no s3 or murmur or increase in P2, and trace bilateral pedal edema   ABD:  Obese soft and nontender with pos end  insp Hoover's  in the supine position. No bruits or organomegaly appreciated, bowel sounds nl  MS:   Nl gait/  ext warm without deformities, calf tenderness, cyanosis or clubbing No obvious joint restrictions   SKIN: warm and dry without lesions    NEURO:  alert, approp, nl sensorium with  no motor or cerebellar deficits apparent.        CXR PA and Lateral:   08/31/2020 :    I personally reviewed images and agree with radiology impression as follows:    did not go to xray  Labs ordered 08/31/2020  :  allergy profile   alpha one AT phenotype  TSH Did not go to lab       Assessment

## 2020-09-01 ENCOUNTER — Encounter: Payer: Self-pay | Admitting: Internal Medicine

## 2020-09-01 DIAGNOSIS — R058 Other specified cough: Secondary | ICD-10-CM | POA: Insufficient documentation

## 2020-09-01 NOTE — Assessment & Plan Note (Signed)
Placed on 02 by Luan Pulling ? when - ONO RA 05/17/20  desat < 89% x 1 h 44 min  So rec continue noct 02 and titrate daytime to keep > 90%  -  08/31/2020   Walked RA  approx   200 ft  @ avg pace  stopped due to  Sob with sats 93%    Adequate control on present rx, reviewed in detail with pt > no change in rx needed  = 2lpm hs and prn daytime for sats > 90% goal

## 2020-09-01 NOTE — Assessment & Plan Note (Signed)
Body mass index is 44.2 kg/m.  -  trending up  No results found for: TSH   Contributing to gerd risk/ doe/reviewed the need and the process to achieve and maintain neg calorie balance > defer f/u primary care including intermittently monitoring thyroid status

## 2020-09-01 NOTE — Assessment & Plan Note (Signed)
Quit smoking 2017  - PFT's  05/11/2020  FEV1 1.32 (33 % ) ratio 0.42  p 25 % improvement from saba p ? prior to study with DLCO  20.57 (69%) corrects to 3.43 (83%)  for alv volume and FV curve classic exp curvature  - 05/11/2020   > try brezri and off spiriva dpi  - 08/31/2020  After extensive coaching inhaler device,  effectiveness =    75% at baseline  - Allergy profile 08/31/2020 >  Eos 0. /  IgE  Ordered  - Alpha one AT phenotype ordered    Group D in terms of symptom/risk and laba/lama/ICS  therefore appropriate rx at this point >>>  Continue breztri and approp use of saba:  I spent extra time with pt today reviewing appropriate use of albuterol for prn use on exertion with the following points: 1) saba is for relief of sob that does not improve by walking a slower pace or resting but rather if the pt does not improve after trying this first. 2) If the pt is convinced, as many are, that saba helps recover from activity faster then it's easy to tell if this is the case by re-challenging : ie stop, take the inhaler, then p 5 minutes try the exact same activity (intensity of workload) that just caused the symptoms and see if they are substantially diminished or not after saba 3) if there is an activity that reproducibly causes the symptoms, try the saba 15 min before the activity on alternate days   If in fact the saba really does help, then fine to continue to use it prn but advised may need to look closer at the maintenance regimen being used to achieve better control of airways disease with exertion.

## 2020-09-01 NOTE — Assessment & Plan Note (Signed)
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   These are the same pts (now being labeled as having "irritable larynx syndrome" by some cough centers) who not infrequently have a history of having failed to tolerate ace inhibitors,  dry powder inhalers or biphosphonates or report having atypical/extraesophageal reflux symptoms that don't respond to standard doses of PPI  and are easily confused as having aecopd or asthma flares by even experienced allergists/ pulmonologists (myself included).   >>  Try gerd rx  And Also added 6 days of Prednisone in case of component of Th-2 driven upper or lower airways inflammation (if cough responds short term only to relapse befor return while will on rx for uacs for gerd that would point to alternative dx eg  allergic rhinitis/ asthma or eos bronchitis)            Each maintenance medication was reviewed in detail including emphasizing most importantly the difference between maintenance and prns and under what circumstances the prns are to be triggered using an action plan format where appropriate.  Total time for H and P, chart review, counseling,  directly observing portions of ambulatory 02 saturation study/  and generating customized AVS unique to this office visit / charting = 30 min

## 2020-09-05 ENCOUNTER — Other Ambulatory Visit: Payer: Self-pay

## 2020-09-05 ENCOUNTER — Other Ambulatory Visit (HOSPITAL_COMMUNITY)
Admission: RE | Admit: 2020-09-05 | Discharge: 2020-09-05 | Disposition: A | Discharge status: Home / Self Care | Payer: Medicaid Other | Source: Ambulatory Visit | Attending: Internal Medicine | Admitting: Internal Medicine

## 2020-09-05 ENCOUNTER — Ambulatory Visit (HOSPITAL_COMMUNITY)
Admission: RE | Admit: 2020-09-05 | Discharge: 2020-09-05 | Disposition: A | Discharge status: Home / Self Care | Payer: Medicaid Other | Source: Ambulatory Visit | Attending: Internal Medicine | Admitting: Internal Medicine

## 2020-09-05 DIAGNOSIS — J449 Chronic obstructive pulmonary disease, unspecified: Secondary | ICD-10-CM | POA: Diagnosis not present

## 2020-09-05 LAB — CBC WITH DIFFERENTIAL/PLATELET
Abs Immature Granulocytes: 0.05 10*3/uL (ref 0.00–0.07)
Basophils Absolute: 0.1 10*3/uL (ref 0.0–0.1)
Basophils Relative: 1 %
Eosinophils Absolute: 0.2 10*3/uL (ref 0.0–0.5)
Eosinophils Relative: 2 %
HCT: 41.7 % (ref 39.0–52.0)
Hemoglobin: 13.7 g/dL (ref 13.0–17.0)
Immature Granulocytes: 1 %
Lymphocytes Relative: 16 %
Lymphs Abs: 1.4 10*3/uL (ref 0.7–4.0)
MCH: 31 pg (ref 26.0–34.0)
MCHC: 32.9 g/dL (ref 30.0–36.0)
MCV: 94.3 fL (ref 80.0–100.0)
Monocytes Absolute: 0.6 10*3/uL (ref 0.1–1.0)
Monocytes Relative: 7 %
Neutro Abs: 6.9 10*3/uL (ref 1.7–7.7)
Neutrophils Relative %: 73 %
Platelets: 230 10*3/uL (ref 150–400)
RBC: 4.42 MIL/uL (ref 4.22–5.81)
RDW: 12.7 % (ref 11.5–15.5)
WBC: 9.3 10*3/uL (ref 4.0–10.5)
nRBC: 0 % (ref 0.0–0.2)

## 2020-09-05 LAB — TSH: TSH: 0.646 u[IU]/mL (ref 0.350–4.500)

## 2020-09-07 LAB — ALPHA-1 ANTITRYPSIN PHENOTYPE: A-1 Antitrypsin, Ser: 147 mg/dL (ref 101–187)

## 2020-09-07 LAB — IGE: IgE (Immunoglobulin E), Serum: 1326 IU/mL — ABNORMAL HIGH (ref 6–495)

## 2020-09-08 NOTE — Progress Notes (Signed)
Spoke with pt and notified of results per Dr. Wert. Pt verbalized understanding and denied any questions. 

## 2020-09-08 NOTE — Progress Notes (Signed)
Spoke with Stephen Warren and notified of results per Dr. Wert. Stephen Warren verbalized understanding and denied any questions. 

## 2021-01-04 ENCOUNTER — Other Ambulatory Visit: Payer: Self-pay | Admitting: Internal Medicine

## 2021-01-04 DIAGNOSIS — J449 Chronic obstructive pulmonary disease, unspecified: Secondary | ICD-10-CM

## 2021-02-21 ENCOUNTER — Other Ambulatory Visit: Payer: Self-pay | Admitting: Internal Medicine

## 2021-02-21 ENCOUNTER — Telehealth: Payer: Self-pay | Admitting: Internal Medicine

## 2021-02-21 DIAGNOSIS — J449 Chronic obstructive pulmonary disease, unspecified: Secondary | ICD-10-CM

## 2021-02-21 MED ORDER — PANTOPRAZOLE SODIUM 40 MG PO TBEC: DELAYED_RELEASE_TABLET | ORAL | 0 refills | Status: DC

## 2021-02-21 MED ORDER — BREZTRI AEROSPHERE 160-9-4.8 MCG/ACT IN AERO: 2.0000 | INHALATION_SPRAY | Freq: Two times a day (BID) | RESPIRATORY_TRACT | 11 refills | Status: DC

## 2021-02-21 NOTE — Telephone Encounter (Signed)
Received refill request on breztri and pantoprazole  Pt overdue f/u and meds already refilled once with note to pharm to call for appt  Tried calling the pt to schedule his appt and there was no answer and his VM is not set up yet  Will mail letter if not call back

## 2021-02-26 MED ORDER — PANTOPRAZOLE SODIUM 40 MG PO TBEC: DELAYED_RELEASE_TABLET | ORAL | 1 refills | Status: DC

## 2021-02-26 MED ORDER — BREZTRI AEROSPHERE 160-9-4.8 MCG/ACT IN AERO: 2.0000 | INHALATION_SPRAY | Freq: Two times a day (BID) | RESPIRATORY_TRACT | 1 refills | Status: DC

## 2021-02-26 NOTE — Telephone Encounter (Signed)
Spoke with pt  Scheduled f/u appt  Refills sent  Nothing further needed

## 2021-03-11 DIAGNOSIS — J441 Chronic obstructive pulmonary disease with (acute) exacerbation: Secondary | ICD-10-CM | POA: Diagnosis not present

## 2021-03-13 ENCOUNTER — Other Ambulatory Visit: Payer: Self-pay

## 2021-03-13 ENCOUNTER — Encounter: Payer: Self-pay | Admitting: Internal Medicine

## 2021-03-13 ENCOUNTER — Ambulatory Visit (INDEPENDENT_AMBULATORY_CARE_PROVIDER_SITE_OTHER): Payer: Medicare HMO | Admitting: Internal Medicine

## 2021-03-13 DIAGNOSIS — J9611 Chronic respiratory failure with hypoxia: Secondary | ICD-10-CM | POA: Diagnosis not present

## 2021-03-13 DIAGNOSIS — R6889 Other general symptoms and signs: Secondary | ICD-10-CM | POA: Diagnosis not present

## 2021-03-13 DIAGNOSIS — J449 Chronic obstructive pulmonary disease, unspecified: Secondary | ICD-10-CM

## 2021-03-13 DIAGNOSIS — R058 Other specified cough: Secondary | ICD-10-CM

## 2021-03-13 MED ORDER — PREDNISONE 10 MG PO TABS: ORAL_TABLET | ORAL | 0 refills | Status: DC

## 2021-03-13 NOTE — Patient Instructions (Signed)
Plan A = Automatic = Always=    Breztri Take 2 puffs first thing in am and then another 2 puffs about 12 hours later.   Work on inhaler technique:  relax and gently blow all the way out then take a nice smooth deep breath back in, triggering the inhaler at same time you start breathing in.  Hold for up to 5 seconds if you can. Blow out thru nose. Rinse and gargle with water when done      Plan B = Backup (to supplement plan A, not to replace it) Only use your albuterol inhaler as a rescue medication to be used if you can't catch your breath by resting or doing a relaxed purse lip breathing pattern.  - The less you use it, the better it will work when you need it. - Ok to use the inhaler up to 2 puffs  every 4 hours if you must but call for appointment if use goes up over your usual need - Don't leave home without it !!  (think of it like the spare tire for your car)   Plan C = Crisis (instead of Plan B but only if Plan B stops working) - only use your albuterol nebulizer if you first try Plan B and it fails to help > ok to use the nebulizer up to every 4 hours but if start needing it regularly call for immediate appointment  Ok to Try albuterol 15 min before (try first the proair, next day the nebulizer, next day nothing) an activity that you know would make you short of breath and see if it makes any difference and if makes none then don't take it after activity unless you can't catch your breath.  Prednisone 10 mg take  4 each am x 2 days,   2 each am x 2 days,  1 each am x 2 days and stop   I am referring you to an allergist in [place]    Please schedule a follow up visit in 3 months but call sooner if needed

## 2021-03-13 NOTE — Assessment & Plan Note (Signed)
Body mass index is 42.35 kg/m.  -  trending no change Lab Results  Component Value Date   TSH 0.646 09/05/2020     Contributing to gerd risk/ doe/reviewed the need and the process to achieve and maintain neg calorie balance > defer f/u primary care including intermittently monitoring thyroid status    F/u q 3 month in this office, sooner if needed      Each maintenance medication was reviewed in detail including emphasizing most importantly the difference between maintenance and prns and under what circumstances the prns are to be triggered using an action plan format where appropriate.  Total time for H and P, chart review, counseling, reviewing hfa device(s) and generating customized AVS unique to this office visit / same day charting  > 30  Min

## 2021-03-13 NOTE — Assessment & Plan Note (Signed)
Add Protonix 40 mg daily ac 08/31/2020  - Allergy profile 08/31/2020 >  Eos 0.2 /  IgE  1326 - Referred to allergy 03/13/2021   Suspect his "globus sensation" may be from allergic rhintis as maybe the case of the cough    1) blow breztri out thru the nose 2) Prednisone 10 mg take  4 each am x 2 days,   2 each am x 2 days,  1 each am x 2 days and stop  3) allergy referral

## 2021-03-13 NOTE — Progress Notes (Signed)
Stephen Warren, male    DOB: 1956/05/05, 65 y.o.   MRN: [id number]   Brief patient profile:  47 yowm MM/quit smoking 2017 with h/o asthma all his life could never play sports using inhalers and nebs daily and worse 2006 met criteria for disability 2012 and arrived in [place] since 2014  And breathing worse even when quit smoking at wt  270 and followed by Luan Pulling so referred to pulmonary clinic 05/11/2020 by Dr  Legrand Rams      History of Present Illness  05/11/2020  Pulmonary/ 1st office eval/Stephen Warren on symbicort and spiriva Chief Complaint  Patient presents with  . Pulmonary Consult    Referred by Dr Legrand Rams. Former Dr Luan Pulling pt.    Dyspnea:  Able to do 10 - 15 minutes in cooler air Cough: non Sleep: in recliner 30 degrees  SABA use: using hfa and neb 4 x daily  02 prn daytime  Up to 4lpm pulsed and sometimes checks sats and finds them usually 88 or above but not usually checking with ex  rec We will order Overnight pulse oximetry on Room air and let you know how you did  Make sure you check your oxygen saturations at highest level of activity Plan A = Automatic = Always=    breztri Take 2 puffs first thing in am and then another 2 puffs about 12 hours later.  Work on inhaler technique:  Plan B = Backup (to supplement plan A, not to replace it) Only use your albuterol inhaler as a rescue medication Plan C = Crisis (instead of Plan B but only if Plan B stops working) - only use your albuterol nebulizer if you first try Plan B Try albuterol 15 min before an activity that you know would make you short of breath and see if it makes any difference and if makes none then don't take it after activity unless you can't catch your breath.        08/31/2020  f/u ov/[place] office/Stephen Warren re: GOLD III / breztri maint/ 02 dep hs and ex  Chief Complaint  Patient presents with  . Follow-up    Breathing has been worse for the past month. He is SOB walking accross the room.   Dyspnea:  In cool weather does  better still not having to stop walking every 1.5 aisle  150 ft to mb is flat stop 3/4  never checks 02 - doesn't use it either unless "gives out"  Cough:  Dry cough daytime  Sleeping: on side bed flat/ 2 pillows  SABA use: using albuterol hfa qid  02: sitting still no 02  /2 liters at hs and pulsing on 2lpm with ex but not monitoring as rec  Worse for the last month with sneezing/ dry cough daytime  rec GERD diet   Protonix 40 mg Take 30-60 min before first meal of the day  Prednisone 10 mg take  4 each am x 2 days,   2 each am x 2 days,  1 each am x 2 days and stop (don't take until after blood test) Try albuterol 15 min before an activity that you know would make you short of breath      Make sure you check your oxygen saturations at highest level of activity to be sure it stays over 90%   - Allergy profile 08/31/2020 >  Eos 0.2 /  IgE  1326 - Alpha one AT phenotype 08/31/2020    MM   Level 147  03/13/2021  f/u ov/[place] office/Stephen Warren re: GOLD III / atopic  Chief Complaint  Patient presents with  . Follow-up    Breathing is doing okay today. He has been wheezing some, relates to pollen allergy. He is using his proair 2-3 x per day and neb 1-2 x per day.   Dyspnea:  slt improvement able to do mb s stopping  Cough: daytime dry worse with pollen - much better p prednisone Sleeping: flt bed on side two pillows  SABA use: as above  02: 2lpm hs and not checking sats with ex but uses 02 when gets sob Covid status: x 3      No obvious day to day or daytime variability or assoc excess/ purulent sputum or mucus plugs or hemoptysis or cp or chest tightness, subjective wheeze or overt sinus or hb symptoms.   Sleeping as above without nocturnal  or early am exacerbation  of respiratory  c/o's or need for noct saba. Also denies any obvious fluctuation of symptoms with weather or environmental changes or other aggravating or alleviating factors except as outlined above   No unusual exposure  hx or h/o childhood pna/ asthma or knowledge of premature birth.  Current Allergies, Complete Past Medical History, Past Surgical History, Family History, and Social History were reviewed in [employer] [facility] record.  ROS  The following are not active complaints unless bolded Hoarseness, sore throat, dysphagia/some globus, dental problems, itching, sneezing,  nasal congestion or discharge of excess mucus or purulent secretions, ear ache,   fever, chills, sweats, unintended wt loss or wt gain, classically pleuritic or exertional cp,  orthopnea pnd or arm/hand swelling  or leg swelling, presyncope, palpitations, abdominal pain, anorexia, nausea, vomiting, diarrhea  or change in bowel habits or change in bladder habits, change in stools or change in urine, dysuria, hematuria,  rash, arthralgias, visual complaints, headache, numbness, weakness or ataxia or problems with walking or coordination,  change in mood or  memory.        Current Meds  Medication Sig  . albuterol (PROVENTIL) (2.5 MG/3ML) 0.083% nebulizer solution Take 2.5 mg by nebulization 4 (four) times daily.  . baclofen (LIORESAL) 20 MG tablet Take 20 mg by mouth daily.   . Budeson-Glycopyrrol-Formoterol (BREZTRI AEROSPHERE) 160-9-4.8 MCG/ACT AERO Inhale 2 puffs into the lungs 2 (two) times daily.  . citalopram (CELEXA) 20 MG tablet Take 20 mg by mouth daily.  . furosemide (LASIX) 20 MG tablet Take 20 mg by mouth daily as needed.  . metFORMIN (GLUCOPHAGE) 500 MG tablet Take 500 mg by mouth 2 (two) times daily with a meal.  . pantoprazole (PROTONIX) 40 MG tablet TAKE 1 TABLET BY MOUTH DAILY. take 30 - 60 MINUTES BEFORE first meal of THE DAY  . PROAIR HFA 108 (90 Base) MCG/ACT inhaler Inhale 1-2 puffs into the lungs 4 (four) times daily as needed for wheezing or shortness of breath.                   Past Medical History:  Diagnosis Date  . ADD (attention deficit disorder)   . Arthritis   . Asthma   . Back pain    . COPD (chronic obstructive pulmonary disease) ([place])   . Depression   . 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 Hemorrhoids   . History of kidney stones   . Insomnia   . Knee pain   . Panic attacks  Objective:      03/13/2021              321  05/11/20 (!) 320 lb (145.2 kg)  09/16/19 (!) 315 lb (142.9 kg)  07/03/19 (!) 324 lb 8 oz (147.2 kg)     Vital signs reviewed  03/13/2021  - Note at rest 02 sats  99% on RA   General appearance:    Obese wm slt hoarse, dry cough     HEENT : pt wearing mask not removed for exam due to covid - 19 concerns.   NECK :  without JVD/Nodes/TM/ nl carotid upstrokes bilaterally   LUNGS: no acc muscle use,  Min barrel  contour chest wall with bilateral  slightly decreased bs s audible wheeze and  without cough on insp or exp maneuvers and min  Hyperresonant  to  percussion bilaterally     CV:  RRR  no s3 or murmur or increase in P2, and no edema   ABD:  Massively obese soft and nontender with pos end  insp Hoover's  in the supine position. No bruits or organomegaly appreciated, bowel sounds nl  MS:   Nl gait/  ext warm without deformities, calf tenderness, cyanosis or clubbing No obvious joint restrictions   SKIN: warm and dry without lesions    NEURO:  alert, approp, nl sensorium with  no motor or cerebellar deficits apparent.            CXR PA and Lateral:  09/05/20  I personally reviewed images and agree with radiology impression as follows:   No acute cardiopulmonary process. Similar appearing mild emphysematous changes.        Assessment

## 2021-03-13 NOTE — Assessment & Plan Note (Signed)
Placed on 02 by Luan Pulling ? when - ONO RA 05/17/20  desat < 89% x 1 h 44 min  So rec continue noct 02 and titrate daytime to keep > 90%  -  08/31/2020   Walked RA  approx   200 ft  @ avg pace  stopped due to  [employer] with sats 93%    Reviewed: Make sure you check your oxygen saturation  at your highest level of activity  to be sure it stays over 90% and adjust  02 flow upward to maintain this level if needed but remember to turn it back to previous settings when you stop (to conserve your supply).

## 2021-03-13 NOTE — Assessment & Plan Note (Signed)
Quit smoking 2017  - PFT's  05/11/2020  FEV1 1.32 (33 % ) ratio 0.42  p 25 % improvement from saba p ? 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 08/31/2020    MM   Level 147  - 03/13/2021  After extensive coaching inhaler device,  effectiveness =  80% (not remembering out thru nose with breztri)    Group D in terms of symptom/risk and laba/lama/ICS  therefore appropriate rx at this point >>>  Continue breztri and approp use of saba:  I spent extra time with pt today reviewing appropriate use of albuterol for prn use on exertion with the following points: 1) saba is for relief of sob that does not improve by walking a slower pace or resting but rather if the pt does not improve after trying this first. 2) If the pt is convinced, as many are, that saba helps recover from activity faster then it's easy to tell if this is the case by re-challenging : ie stop, take the inhaler, then p 5 minutes try the exact same activity (intensity of workload) that just caused the symptoms and see if they are substantially diminished or not after saba 3) if there is an activity that reproducibly causes the symptoms, try the saba 15 min before the activity on alternate days   If in fact the saba really does help, then fine to continue to use it prn but advised may need to look closer at the maintenance regimen being used to achieve better control of airways disease with exertion.

## 2021-03-20 DIAGNOSIS — R6889 Other general symptoms and signs: Secondary | ICD-10-CM | POA: Diagnosis not present

## 2021-03-20 DIAGNOSIS — J449 Chronic obstructive pulmonary disease, unspecified: Secondary | ICD-10-CM | POA: Diagnosis not present

## 2021-03-20 DIAGNOSIS — F339 Major depressive disorder, recurrent, unspecified: Secondary | ICD-10-CM | POA: Diagnosis not present

## 2021-03-20 DIAGNOSIS — J9611 Chronic respiratory failure with hypoxia: Secondary | ICD-10-CM | POA: Diagnosis not present

## 2021-03-20 DIAGNOSIS — E1165 Type 2 diabetes mellitus with hyperglycemia: Secondary | ICD-10-CM | POA: Diagnosis not present

## 2021-03-23 DIAGNOSIS — Z79899 Other long term (current) drug therapy: Secondary | ICD-10-CM | POA: Diagnosis not present

## 2021-03-23 DIAGNOSIS — Z0001 Encounter for general adult medical examination with abnormal findings: Secondary | ICD-10-CM | POA: Diagnosis not present

## 2021-03-23 DIAGNOSIS — R6889 Other general symptoms and signs: Secondary | ICD-10-CM | POA: Diagnosis not present

## 2021-03-23 DIAGNOSIS — J439 Emphysema, unspecified: Secondary | ICD-10-CM | POA: Diagnosis not present

## 2021-04-11 DIAGNOSIS — J441 Chronic obstructive pulmonary disease with (acute) exacerbation: Secondary | ICD-10-CM | POA: Diagnosis not present

## 2021-04-20 DIAGNOSIS — J9611 Chronic respiratory failure with hypoxia: Secondary | ICD-10-CM | POA: Diagnosis not present

## 2021-04-20 DIAGNOSIS — F17200 Nicotine dependence, unspecified, uncomplicated: Secondary | ICD-10-CM | POA: Diagnosis not present

## 2021-05-11 DIAGNOSIS — J441 Chronic obstructive pulmonary disease with (acute) exacerbation: Secondary | ICD-10-CM | POA: Diagnosis not present

## 2021-05-18 ENCOUNTER — Encounter: Payer: Self-pay | Admitting: Allergy & Immunology

## 2021-05-18 ENCOUNTER — Ambulatory Visit (INDEPENDENT_AMBULATORY_CARE_PROVIDER_SITE_OTHER): Payer: Medicare HMO | Admitting: Allergy & Immunology

## 2021-05-18 ENCOUNTER — Other Ambulatory Visit: Payer: Self-pay

## 2021-05-18 VITALS — HR 90 | Temp 97.4°F | Resp 18 | Ht 71.0 in | Wt 316.0 lb

## 2021-05-18 DIAGNOSIS — J449 Chronic obstructive pulmonary disease, unspecified: Secondary | ICD-10-CM

## 2021-05-18 DIAGNOSIS — J3089 Other allergic rhinitis: Secondary | ICD-10-CM | POA: Diagnosis not present

## 2021-05-18 DIAGNOSIS — T7800XD Anaphylactic reaction due to unspecified food, subsequent encounter: Secondary | ICD-10-CM

## 2021-05-18 DIAGNOSIS — T7800XA Anaphylactic reaction due to unspecified food, initial encounter: Secondary | ICD-10-CM | POA: Insufficient documentation

## 2021-05-18 DIAGNOSIS — R6889 Other general symptoms and signs: Secondary | ICD-10-CM | POA: Diagnosis not present

## 2021-05-18 DIAGNOSIS — J302 Other seasonal allergic rhinitis: Secondary | ICD-10-CM

## 2021-05-18 MED ORDER — CETIRIZINE HCL 10 MG PO TABS: 10.0000 mg | ORAL_TABLET | Freq: Every day | ORAL | 5 refills | Status: DC

## 2021-05-18 MED ORDER — MONTELUKAST SODIUM 10 MG PO TABS: 10.0000 mg | ORAL_TABLET | Freq: Every day | ORAL | 5 refills | Status: DC

## 2021-05-18 NOTE — Progress Notes (Signed)
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 Melvyn Novas  Plan/Recommendations:   1. Chronic rhinitis - Testing today showed: grasses, ragweed, weeds, trees, indoor molds, outdoor molds, and dust mites - Copy of test results provided.  - Avoidance measures provided. - Start taking: Zyrtec (cetirizine) 10mg  tablet once daily, Singulair (montelukast) 10mg  daily, and Flonase (fluticasone) one spray per nostril daily - You can use an extra dose of the antihistamine, if needed, for breakthrough symptoms.  - Consider nasal saline rinses 1-2 times daily to remove allergens from the nasal cavities as well as help with mucous clearance (this is especially helpful to do before the nasal sprays are given) - Consider allergy shots as a means of long-term control. - Allergy shots "re-train" and "reset" the immune system to ignore environmental allergens and decrease the resulting immune response to those allergens (sneezing, itchy watery eyes, runny nose, nasal congestion, etc).    - Allergy shots improve symptoms in 75-85% of patients.  - We can discuss more at the next appointment if the medications are not working for you.  2. Anaphylactic shock due to food - Testing was negative to all of the shellfish tested. - Because of your history, we are going to get blood work. - We we will hold off on EpiPen training at this time and consider a challenge in the future if the testing is negative. - Although seafood testing is rarely outgrown, it does happen.  3. COPD GOLD III  - Continue to follow with Dr. Melvyn Novas. - Continue with Breztri 2 puffs twice daily. - Continue with supplemental  oxygen. - Continue with albuterol 1 nebulizer treatment or 4 puffs metered-dose inhaler every 4 hours as needed.  4. Return in about 6 weeks (around 06/29/2021).   This note in its entirety was forwarded to the Provider who requested this consultation.  Subjective:   Stephen Warren is a 65 y.o. male presenting today for evaluation of  Chief Complaint  Patient presents with   Asthma    Pulmonologist referred him because he believes his breathing issues are more so his asthma and allergies more than his COPD    Stephen Warren has a history of the following: Patient Active Problem List   Diagnosis Date Noted   Seasonal and perennial allergic rhinitis 05/18/2021   Anaphylactic shock due to adverse food reaction 05/18/2021   Upper airway cough syndrome 09/01/2020   Chronic respiratory failure with hypoxia ([place]) 05/11/2020   Morbid (severe) obesity due to excess calories ([place]) 05/11/2020   Duodenal ulcer    Symptomatic anemia 07/03/2019   Acute upper GI bleed 07/03/2019   COPD GOLD III     Depression    Panic attacks    S/P nasal septoplasty 07/01/2018   History of colonic polyps    Diverticulosis of colon without hemorrhage    Mucosal abnormality of colon    Encounter for screening colonoscopy 10/16/2015   Hemorrhoids 10/16/2015    History obtained from: chart review and patient.  Stephen Warren was referred by [name], MD.     Stephen Warren is a 65 y.o. male presenting for an evaluation of possible environmental allergies .  He got his name when  he was at [employer] (he has a couple of beers on the 4th of July). There were a number of [place] there and they needed to figure out which Lyndel he was talking about.    Asthma/Respiratory Symptom History: He has Breztri to use two puffs twice daily. He has a nebulizer and an MDI to use as needed. He thinks that the Stephen Warren is working fairly well.  He has not supplemental oxygen.  This is all managed by Dr. Melvyn Novas at  pulmonology.  Allergic Rhinitis Symptom History: He does have chronic rhinitis symptoms. He was allergic to "everything" as a child.  He is currently on nothing for his allergy symptoms. He has been on a number of antihistamines in the past. He has been on nasal sprays OTC.  He has sinus infections 2-3 times per year.   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  He has reacted to a lobster around 18 years ago with throat swelling. He has avoided since that time. He still drinks milk and has no problems with that. He does not have an update to EpiPen.   Otherwise, there is no history of other atopic diseases, including food allergies, drug allergies, stinging insect allergies, eczema, urticaria, or contact dermatitis. There is no significant infectious history. Vaccinations are up to date.    Past Medical History: Patient Active Problem List   Diagnosis Date Noted   Seasonal and perennial allergic rhinitis 05/18/2021   Anaphylactic shock due to adverse food reaction 05/18/2021   Upper airway cough syndrome 09/01/2020   Chronic respiratory failure with hypoxia ([place]) 05/11/2020   Morbid (severe) obesity due to excess calories ([place]) 05/11/2020   Duodenal ulcer    Symptomatic anemia 07/03/2019   Acute upper GI bleed 07/03/2019   COPD GOLD III     Depression    Panic attacks    S/P nasal septoplasty 07/01/2018   History of colonic polyps    Diverticulosis of colon without hemorrhage    Mucosal abnormality of colon    Encounter for screening colonoscopy 10/16/2015   Hemorrhoids 10/16/2015    Medication List:  Allergies as of 05/18/2021       Reactions   Other Anaphylaxis   Caterpillar    Iodine    Not sure of reaction   Penicillin G    Shellfish Allergy Swelling        Medication List        Accurate as of May 18, 2021  2:30 PM. If you have any questions, ask your nurse or doctor.          STOP taking these medications     predniSONE 10 MG tablet Commonly known as: DELTASONE Stopped by: Valentina Shaggy, MD       TAKE these medications    atorvastatin 20 MG tablet Commonly known as: LIPITOR   baclofen 20 MG tablet Commonly known as: LIORESAL Take 20 mg by mouth daily.   Breztri Aerosphere 160-9-4.8 MCG/ACT Aero Generic drug: Budeson-Glycopyrrol-Formoterol Inhale 2 puffs into the lungs 2 (two) times daily.   cetirizine 10 MG tablet Commonly known as: ZYRTEC Take 1 tablet (10 mg total) by mouth daily. Started by: Valentina Shaggy, MD   citalopram 20 MG tablet Commonly known as: CELEXA Take 20 mg by mouth daily.   fluticasone 50 MCG/ACT nasal spray Commonly known as: FLONASE   furosemide 20 MG tablet Commonly known as: LASIX Take 20 mg by mouth daily as  needed.   metFORMIN 500 MG tablet Commonly known as: GLUCOPHAGE Take 500 mg by mouth 2 (two) times daily with a meal.   montelukast 10 MG tablet Commonly known as: SINGULAIR Take 1 tablet (10 mg total) by mouth at bedtime. Started by: Valentina Shaggy, MD   pantoprazole 40 MG tablet Commonly known as: PROTONIX TAKE 1 TABLET BY MOUTH DAILY. take 30 - 60 MINUTES BEFORE first meal of THE DAY   albuterol (2.5 MG/3ML) 0.083% nebulizer solution Commonly known as: PROVENTIL Take 2.5 mg by nebulization 4 (four) times daily.   ProAir HFA 108 (90 Base) MCG/ACT inhaler Generic drug: albuterol Inhale 1-2 puffs into the lungs 4 (four) times daily as needed for wheezing or shortness of breath.   traZODone 100 MG tablet Commonly known as: DESYREL Take two (2) tablets by mouth at bedtime        Birth History: non-contributory  Developmental History: non-contributory  Past Surgical History: Past Surgical History:  Procedure Laterality Date   BIOPSY  10/30/2015   Procedure: BIOPSY;  Surgeon: Daneil Dolin, MD;  [facility];  Service: Endoscopy;;  ileocecal valve ulcer   BIOPSY  07/04/2019   Procedure:  BIOPSY;  Surgeon: Danie Binder, MD;  [facility];  Service: Endoscopy;;  gastric   COLONOSCOPY WITH PROPOFOL N/A 10/30/2015   Procedure: COLONOSCOPY WITH PROPOFOL;  Surgeon: Daneil Dolin, MD;  [facility];  Service: Endoscopy;  Laterality: N/A;  1000    COLONOSCOPY WITH PROPOFOL N/A 09/16/2019   Procedure: COLONOSCOPY WITH PROPOFOL;  Surgeon: Daneil Dolin, MD;  [facility];  Service: Endoscopy;  Laterality: N/A;  8:15am   ESOPHAGOGASTRODUODENOSCOPY (EGD) WITH PROPOFOL N/A 07/04/2019   Procedure: ESOPHAGOGASTRODUODENOSCOPY (EGD) WITH PROPOFOL;  Surgeon: Danie Binder, MD;  [facility];  Service: Endoscopy;  Laterality: N/A;   NASAL SEPTOPLASTY W/ TURBINOPLASTY  07/01/2018   NASAL SEPTOPLASTY W/ TURBINOPLASTY Bilateral 07/01/2018   Procedure: NASAL SEPTOPLASTY WITH BILATERAL TURBINATE REDUCTION;  Surgeon: Leta Baptist, MD;  Location: [facility] OR;  Service: ENT;  Laterality: Bilateral;   None to date  10/16/2015   POLYPECTOMY  10/30/2015   Procedure: POLYPECTOMY;  Surgeon: Daneil Dolin, MD;  [facility];  Service: Endoscopy;;  sigmoid polypectomy  x3     Family History: Family History  Problem Relation Age of Onset   Asthma Mother    Allergic rhinitis Mother    Allergic rhinitis Brother    Asthma Maternal Grandmother    Colon cancer Neg Hx    Colon polyps Neg Hx    Eczema Neg Hx    Urticaria Neg Hx      Social History: Abijah lives at home with his family.  He lives in an apartment that was built in 1932.  There is ceramic tile throughout the home.  They have electric heating and heat pump for heating and cooling.  There are no animals inside or outside of the home.  He does be in the car.  There is no exposure to fumes, chemicals, or dust.  He does not use a HEPA filter.  He does not live near an interstate or industrial area.   Review of Systems  Constitutional: Negative.  Negative for chills, fever, malaise/fatigue and  weight loss.  HENT:  Negative for congestion, ear discharge, ear pain and sinus pain.   Eyes:  Negative for pain, discharge and redness.  Respiratory:  Positive for cough and shortness of breath.  Negative for sputum production and wheezing.   Cardiovascular: Negative.  Negative for chest pain and palpitations.  Gastrointestinal:  Negative for abdominal pain, constipation, diarrhea, heartburn, nausea and vomiting.  Skin: Negative.  Negative for itching and rash.  Neurological:  Negative for dizziness and headaches.  Endo/Heme/Allergies:  Positive for environmental allergies. Does not bruise/bleed easily.      Objective:   Pulse 90, temperature (!) 97.4 F (36.3 C), temperature source Temporal, resp. rate 18, height 5\' 11"  (1.803 m), weight (!) 316 lb (143.3 kg), SpO2 94 %. Body mass index is 44.07 kg/m.   Physical Exam:   Physical Exam Vitals reviewed.  Constitutional:      Appearance: He is well-developed.  HENT:     Head: Normocephalic and atraumatic.     Right Ear: Tympanic membrane, ear canal and external ear normal. No drainage, swelling or tenderness. Tympanic membrane is not injected, scarred, erythematous, retracted or bulging.     Left Ear: Tympanic membrane, ear canal and external ear normal. No drainage, swelling or tenderness. Tympanic membrane is not injected, scarred, erythematous, retracted or bulging.     Nose: No nasal deformity, septal deviation, mucosal edema or rhinorrhea.     Right Turbinates: Enlarged and swollen.     Left Turbinates: Enlarged and swollen.     Right Sinus: No maxillary sinus tenderness or frontal sinus tenderness.     Left Sinus: No maxillary sinus tenderness or frontal sinus tenderness.     Comments: No nasal polyps noted.     Mouth/Throat:     Mouth: Mucous membranes are not pale and not dry.     Pharynx: Uvula midline.  Eyes:     General: Lids are normal.        Right eye: No discharge.        Left eye: No discharge.      Conjunctiva/sclera: Conjunctivae normal.     Right eye: Right conjunctiva is not injected. No chemosis.    Left eye: Left conjunctiva is not injected. No chemosis.    Pupils: Pupils are equal, round, and reactive to light.  Cardiovascular:     Rate and Rhythm: Normal rate and regular rhythm.     Heart sounds: Normal heart sounds.  Pulmonary:     Effort: Pulmonary effort is normal. No tachypnea, accessory muscle usage or respiratory distress.     Breath sounds: Normal breath sounds. No wheezing, rhonchi or rales.     Comments: Decreased air movement at the bases.  Chest:     Chest wall: No tenderness.  Abdominal:     Tenderness: There is no abdominal tenderness. There is no guarding or rebound.  Lymphadenopathy:     Head:     Right side of head: No submandibular, tonsillar or occipital adenopathy.     Left side of head: No submandibular, tonsillar or occipital adenopathy.     Cervical: No cervical adenopathy.  Skin:    General: Skin is warm.     Capillary Refill: Capillary refill takes less than 2 seconds.     Coloration: Skin is not pale.     Findings: No abrasion, erythema, petechiae or rash. Rash is not papular, urticarial or vesicular.     Comments: Multiple age spots. No eczema.   Neurological:     Mental Status: He is alert.  Psychiatric:        Behavior: Behavior is cooperative.     Diagnostic studies:   Allergy Studies:     Airborne Adult Perc -  05/18/21 6659     Time Antigen Placed 0920    Allergen Manufacturer Lavella Hammock    Location Back    Number of Test 59    Panel 1 Select    1. Control-Buffer 50% Glycerol Negative    2. Control-Histamine 1 mg/ml 2+    3. Albumin saline Negative    4. Altha Negative    5. [place] Negative    6. Johnson Negative    7. Holiday Blue Negative    8. Meadow Fescue Negative    9. Perennial Rye Negative    10. Sweet Vernal Negative    11. Timothy Negative    12. Cocklebur Negative    13. Burweed Marshelder Negative    14.  Ragweed, short Negative    15. Ragweed, Giant Negative    16. Plantain,  English Negative    17. Lamb's Quarters Negative    18. Sheep Sorrell Negative    19. Rough Pigweed Negative    20. Marsh Elder, Rough Negative    21. Mugwort, Common Negative    22. Ash mix Negative    23. Birch mix Negative    24. Beech American Negative    25. Box, Elder Negative    26. Cedar, red Negative    27. Cottonwood, [place] Negative    28. Elm mix Negative    29. Hickory Negative    30. Maple mix Negative    31. Oak, [place] mix Negative    32. Pecan Pollen Negative    33. Pine mix Negative    34. Sycamore Eastern Negative    35. Wallace, Black Pollen Negative    36. Alternaria alternata Negative    37. Cladosporium Herbarum Negative    38. Aspergillus mix Negative    39. Penicillium mix Negative    40. Bipolaris sorokiniana (Helminthosporium) Negative    41. Drechslera spicifera (Curvularia) Negative    42. Mucor plumbeus Negative    43. Fusarium moniliforme Negative    44. Aureobasidium pullulans (pullulara) Negative    45. Rhizopus oryzae Negative    46. Botrytis cinera Negative    47. Epicoccum nigrum Negative    48. Phoma betae Negative    49. Candida Albicans Negative    50. Trichophyton mentagrophytes Negative    51. Mite, D Farinae  5,000 AU/ml Negative    52. Mite, D Pteronyssinus  5,000 AU/ml Negative    53. Cat Hair 10,000 BAU/ml Negative    54.  Dog Epithelia Negative    55. Mixed Feathers Negative    56. Horse Epithelia Negative    57. Cockroach, German Negative    58. Mouse Negative    59. Tobacco Leaf Negative             Intradermal - 05/18/21 1030     Time Antigen Placed 1000    Allergen Manufacturer Greer    Location Arm    Number of Test 15    Intradermal Select    [place] Negative    Johnson Negative    7 Grass 2+    Ragweed mix 3+    Weed mix 1+    Tree mix 1+    Mold 1 3+    Mold 2 3+    Mold 3 3+    Mold 4 3+    Cat Negative    Dog Negative     Cockroach Negative    Mite mix 2+  Food Adult Perc - 05/18/21 0900     Time Antigen Placed 0920    Allergen Manufacturer Lavella Hammock    Location Back    Number of allergen test 6     Control-buffer 50% Glycerol Negative    Control-Histamine 1 mg/ml 2+    8. Shellfish Mix Negative    25. Shrimp Negative    26. Crab Negative    27. Lobster Negative    28. Oyster Negative    29. Scallops Negative             Allergy testing results were read and interpreted by myself, documented by clinical staff.         Salvatore Marvel, MD [facility] [place]

## 2021-05-18 NOTE — Patient Instructions (Addendum)
1. Chronic rhinitis - Testing today showed: grasses, ragweed, weeds, trees, indoor molds, outdoor molds, and dust mites - Copy of test results provided.  - Avoidance measures provided. - Start taking: Zyrtec (cetirizine) 10mg  tablet once daily, Singulair (montelukast) 10mg  daily, and Flonase (fluticasone) one spray per nostril daily - You can use an extra dose of the antihistamine, if needed, for breakthrough symptoms.  - Consider nasal saline rinses 1-2 times daily to remove allergens from the nasal cavities as well as help with mucous clearance (this is especially helpful to do before the nasal sprays are given) - Consider allergy shots as a means of long-term control. - Allergy shots "re-train" and "reset" the immune system to ignore environmental allergens and decrease the resulting immune response to those allergens (sneezing, itchy watery eyes, runny nose, nasal congestion, etc).    - Allergy shots improve symptoms in 75-85% of patients.  - We can discuss more at the next appointment if the medications are not working for you.  2. Anaphylactic shock due to food - Testing was negative to all of the shellfish tested. - Because of your history, we are going to get blood work. - We we will hold off on EpiPen training at this time and consider a challenge in the future if the testing is negative. - Although seafood testing is rarely outgrown, it does happen.  3. COPD GOLD III  - Continue to follow with Dr. Melvyn Novas. - Continue with Breztri 2 puffs twice daily. - Continue with supplemental oxygen. - Continue with albuterol 1 nebulizer treatment or 4 puffs metered-dose inhaler every 4 hours as needed.  4. Return in about 6 weeks (around 06/29/2021).    Please inform us of any Emergency Department visits, hospitalizations, or changes in symptoms. Call us before going to the ED for breathing or allergy symptoms since we might be able to fit you in for a sick visit. Feel free to contact us anytime  with any questions, problems, or concerns.  It was a pleasure to meet you today!  Websites that have reliable patient information: 1. American Academy of Asthma, Allergy, and Immunology: www.aaaai.org 2. Food Allergy Research and Education (FARE): foodallergy.org 3. Mothers of Asthmatics: http://www.asthmacommunitynetwork.org 4. American College of Allergy, Asthma, and Immunology: www.acaai.org   COVID-19 Vaccine Information can be found at: ShippingScam.[email or web address].uk For questions related to vaccine distribution or appointments, please email [email or web address] or call [phone].   We realize that you might be concerned about having an allergic reaction to the COVID19 vaccines. To help with that concern, WE ARE OFFERING THE COVID19 VACCINES IN OUR OFFICE! Ask the front desk for dates!     "Like" [place] on Facebook and Instagram for our latest updates!      A healthy democracy works best when [employer] participate! Make sure you are registered to vote! If you have moved or changed any of your contact information, you will need to get this updated before voting!  In some cases, you MAY be able to register to vote online: [email or web address]    Reducing Pollen Exposure  The American Academy of Allergy, Asthma and Immunology suggests the following steps to reduce your exposure to pollen during allergy seasons.    Do not hang sheets or clothing out to dry; pollen may collect on these items. Do not mow lawns or spend time around freshly cut grass; mowing stirs up pollen. Keep windows closed at night.  Keep car windows closed while driving. Minimize morning activities outdoors, a time when pollen counts  are usually at their highest. Stay indoors as much as possible when pollen counts or humidity is high and on windy days when pollen tends to remain in the air longer. Use air conditioning when possible.   Many air conditioners have filters that trap the pollen spores. Use a HEPA room air filter to remove pollen form the indoor air you breathe.  Control of Mold Allergen   Mold and fungi can grow on a variety of surfaces provided certain temperature and moisture conditions exist.  Outdoor molds grow on plants, decaying vegetation and soil.  The major outdoor mold, Alternaria and Cladosporium, are found in very high numbers during hot and dry conditions.  Generally, a late Summer - Fall peak is seen for common outdoor fungal spores.  Rain will temporarily lower outdoor mold spore count, but counts rise rapidly when the rainy period ends.  The most important indoor molds are Aspergillus and Penicillium.  Dark, humid and poorly ventilated basements are ideal sites for mold growth.  The next most common sites of mold growth are the bathroom and the kitchen.  Outdoor (Seasonal) Mold Control  Positive outdoor molds via skin testing: Alternaria, Cladosporium, Bipolaris (Helminthsporium), Drechslera (Curvalaria), and Mucor  Use air conditioning and keep windows closed Avoid exposure to decaying vegetation. Avoid leaf raking. Avoid grain handling. Consider wearing a face mask if working in moldy areas.    Indoor (Perennial) Mold Control   Positive indoor molds via skin testing: Aspergillus, Penicillium, Fusarium, Aureobasidium (Pullulara), and Rhizopus  Maintain humidity below 50%. Clean washable surfaces with 5% bleach solution. Remove sources e.g. contaminated carpets.    Control of Dust Mite Allergen    Dust mites play a major role in allergic asthma and rhinitis.  They occur in environments with high humidity wherever human skin is found.  Dust mites absorb humidity from the atmosphere (ie, they do not drink) and feed on organic matter (including shed human and animal skin).  Dust mites are a microscopic type of insect that you cannot see with the naked eye.  High levels of dust mites have  been detected from mattresses, pillows, carpets, upholstered furniture, bed covers, clothes, soft toys and any woven material.  The principal allergen of the dust mite is found in its feces.  A gram of dust may contain 1,000 mites and 250,000 fecal particles.  Mite antigen is easily measured in the air during house cleaning activities.  Dust mites do not bite and do not cause harm to humans, other than by triggering allergies/asthma.    Ways to decrease your exposure to dust mites in your home:  Encase mattresses, box springs and pillows with a mite-impermeable barrier or cover   Wash sheets, blankets and drapes weekly in hot water (130 F) with detergent and dry them in a dryer on the hot setting.  Have the room cleaned frequently with a vacuum cleaner and a damp dust-mop.  For carpeting or rugs, vacuuming with a vacuum cleaner equipped with a high-efficiency particulate air (HEPA) filter.  The dust mite allergic individual should not be in a room which is being cleaned and should wait 1 hour after cleaning before going into the room. Do not sleep on upholstered furniture (eg, couches).   If possible removing carpeting, upholstered furniture and drapery from the home is ideal.  Horizontal blinds should be eliminated in the rooms where the person spends the most time (bedroom, study, television room).  Washable vinyl, roller-type shades are optimal. Remove all non-washable stuffed toys  from the bedroom.  Wash stuffed toys weekly like sheets and blankets above.   Reduce indoor humidity to less than 50%.  Inexpensive humidity monitors can be purchased at most hardware stores.  Do not use a humidifier as can make the problem worse and are not recommended.  Allergy Shots   Allergies are the result of a chain reaction that starts in the immune system. Your immune system controls how your body defends itself. For instance, if you have an allergy to pollen, your immune system identifies pollen as an invader or  allergen. Your immune system overreacts by producing antibodies called Immunoglobulin E (IgE). These antibodies travel to cells that release chemicals, causing an allergic reaction.  The concept behind allergy immunotherapy, whether it is received in the form of shots or tablets, is that the immune system can be desensitized to specific allergens that trigger allergy symptoms. Although it requires time and patience, the payback can be long-term relief.  How Do Allergy Shots Work?  Allergy shots work much like a vaccine. Your body responds to injected amounts of a particular allergen given in increasing doses, eventually developing a resistance and tolerance to it. Allergy shots can lead to decreased, minimal or no allergy symptoms.  There generally are two phases: build-up and maintenance. Build-up often ranges from three to six months and involves receiving injections with increasing amounts of the allergens. The shots are typically given once or twice a week, though more rapid build-up schedules are sometimes used.  The maintenance phase begins when the most effective dose is reached. This dose is different for each person, depending on how allergic you are and your response to the build-up injections. Once the maintenance dose is reached, there are longer periods between injections, typically two to four weeks.  Occasionally doctors give cortisone-type shots that can temporarily reduce allergy symptoms. These types of shots are different and should not be confused with allergy immunotherapy shots.  Who Can Be Treated with Allergy Shots?  Allergy shots may be a good treatment approach for people with allergic rhinitis (hay fever), allergic asthma, conjunctivitis (eye allergy) or stinging insect allergy.   Before deciding to begin allergy shots, you should consider:   The length of allergy season and the severity of your symptoms  Whether medications and/or changes to your environment can  control your symptoms  Your desire to avoid long-term medication use  Time: allergy immunotherapy requires a major time commitment  Cost: may vary depending on your insurance coverage  Allergy shots for children age 17 and older are effective and often well tolerated. They might prevent the onset of new allergen sensitivities or the progression to asthma.  Allergy shots are not started on patients who are pregnant but can be continued on patients who become pregnant while receiving them. In some patients with other medical conditions or who take certain common medications, allergy shots may be of risk. It is important to mention other medications you talk to your allergist.   When Will I Feel Better?  Some may experience decreased allergy symptoms during the build-up phase. For others, it may take as long as 12 months on the maintenance dose. If there is no improvement after a year of maintenance, your allergist will discuss other treatment options with you.  If you aren't responding to allergy shots, it may be because there is not enough dose of the allergen in your vaccine or there are missing allergens that were not identified during your allergy testing. Other reasons  could be that there are high levels of the allergen in your environment or major exposure to non-allergic triggers like tobacco smoke.  What Is the Length of Treatment?  Once the maintenance dose is reached, allergy shots are generally continued for three to five years. The decision to stop should be discussed with your allergist at that time. Some people may experience a permanent reduction of allergy symptoms. Others may relapse and a longer course of allergy shots can be considered.  What Are the Possible Reactions?  The two types of adverse reactions that can occur with allergy shots are local and systemic. Common local reactions include very mild redness and swelling at the injection site, which can happen immediately or  several hours after. A systemic reaction, which is less common, affects the entire body or a particular body system. They are usually mild and typically respond quickly to medications. Signs include increased allergy symptoms such as sneezing, a stuffy nose or hives.  Rarely, a serious systemic reaction called anaphylaxis can develop. Symptoms include swelling in the throat, wheezing, a feeling of tightness in the chest, nausea or dizziness. Most serious systemic reactions develop within 30 minutes of allergy shots. This is why it is strongly recommended you wait in your doctor's office for 30 minutes after your injections. Your allergist is trained to watch for reactions, and his or her staff is trained and equipped with the proper medications to identify and treat them.  Who Should Administer Allergy Shots?  The preferred location for receiving shots is your prescribing allergist's office. Injections can sometimes be given at another facility where the physician and staff are trained to recognize and treat reactions, and have received instructions by your prescribing allergist.

## 2021-05-20 DIAGNOSIS — E1165 Type 2 diabetes mellitus with hyperglycemia: Secondary | ICD-10-CM | POA: Diagnosis not present

## 2021-05-20 DIAGNOSIS — F339 Major depressive disorder, recurrent, unspecified: Secondary | ICD-10-CM | POA: Diagnosis not present

## 2021-05-28 DIAGNOSIS — T7800XD Anaphylactic reaction due to unspecified food, subsequent encounter: Secondary | ICD-10-CM | POA: Diagnosis not present

## 2021-05-30 LAB — ALLERGEN PROFILE, SHELLFISH
Clam IgE: <0.1 kU/L
F023-IgE Crab: <0.1 kU/L
F080-IgE Lobster: <0.1 kU/L
F290-IgE Oyster: <0.1 kU/L
Scallop IgE: <0.1 kU/L
Shrimp IgE: 0.27 kU/L — AB

## 2021-06-06 DIAGNOSIS — R6889 Other general symptoms and signs: Secondary | ICD-10-CM | POA: Diagnosis not present

## 2021-06-06 DIAGNOSIS — F331 Major depressive disorder, recurrent, moderate: Secondary | ICD-10-CM | POA: Diagnosis not present

## 2021-06-11 DIAGNOSIS — J441 Chronic obstructive pulmonary disease with (acute) exacerbation: Secondary | ICD-10-CM | POA: Diagnosis not present

## 2021-06-20 DIAGNOSIS — E1165 Type 2 diabetes mellitus with hyperglycemia: Secondary | ICD-10-CM | POA: Diagnosis not present

## 2021-06-20 DIAGNOSIS — J9611 Chronic respiratory failure with hypoxia: Secondary | ICD-10-CM | POA: Diagnosis not present

## 2021-06-26 ENCOUNTER — Other Ambulatory Visit: Payer: Self-pay

## 2021-06-26 MED ORDER — BREZTRI AEROSPHERE 160-9-4.8 MCG/ACT IN AERO: 2.0000 | INHALATION_SPRAY | Freq: Two times a day (BID) | RESPIRATORY_TRACT | 2 refills | Status: AC

## 2021-06-28 IMAGING — DX DG CHEST 2V
2 series · 2 of 2 positions shown · non-contrast
Comparison: 07/03/2019

CLINICAL DATA: Shortness of breath

EXAM:
CHEST - 2 VIEW

[chest pa]
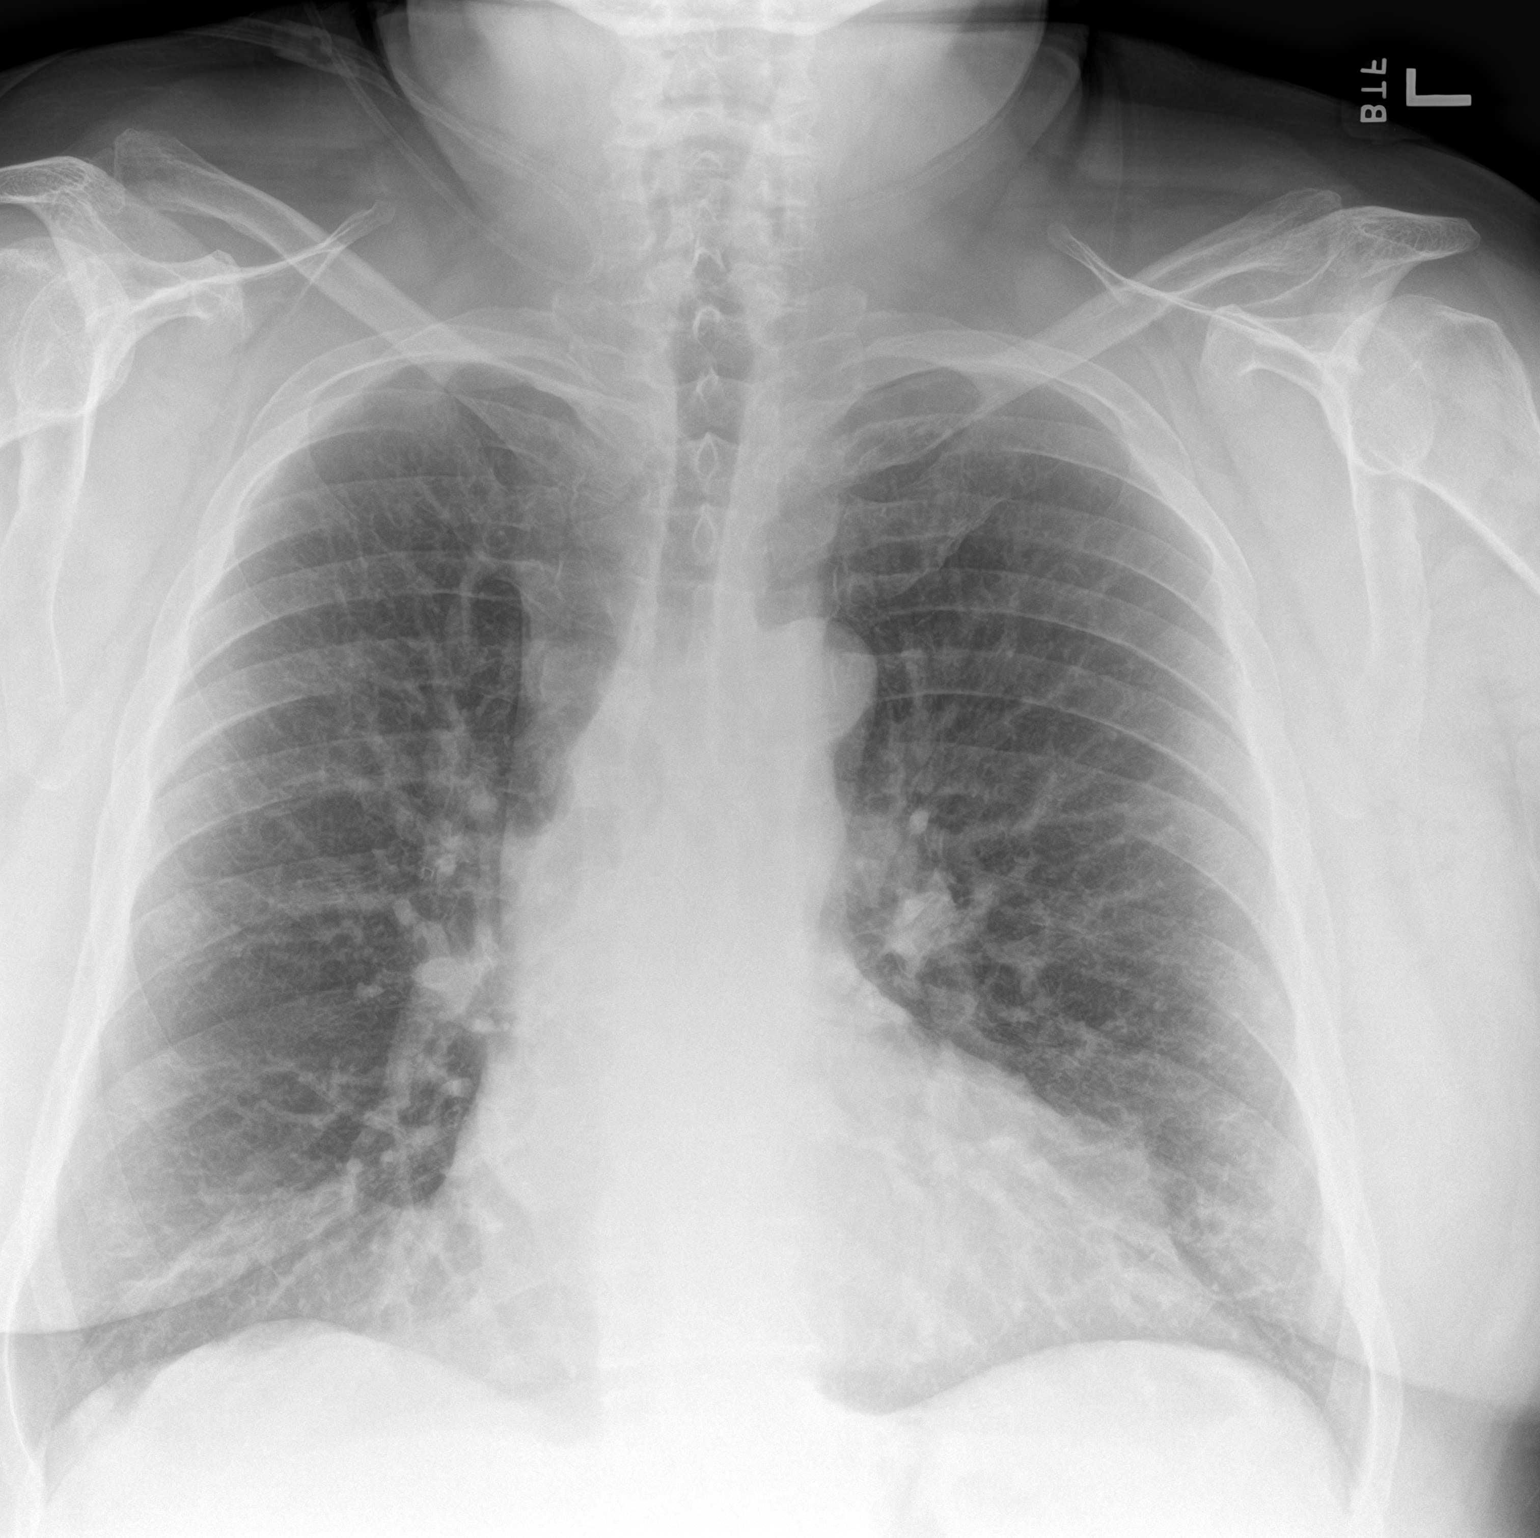

[chest lat]
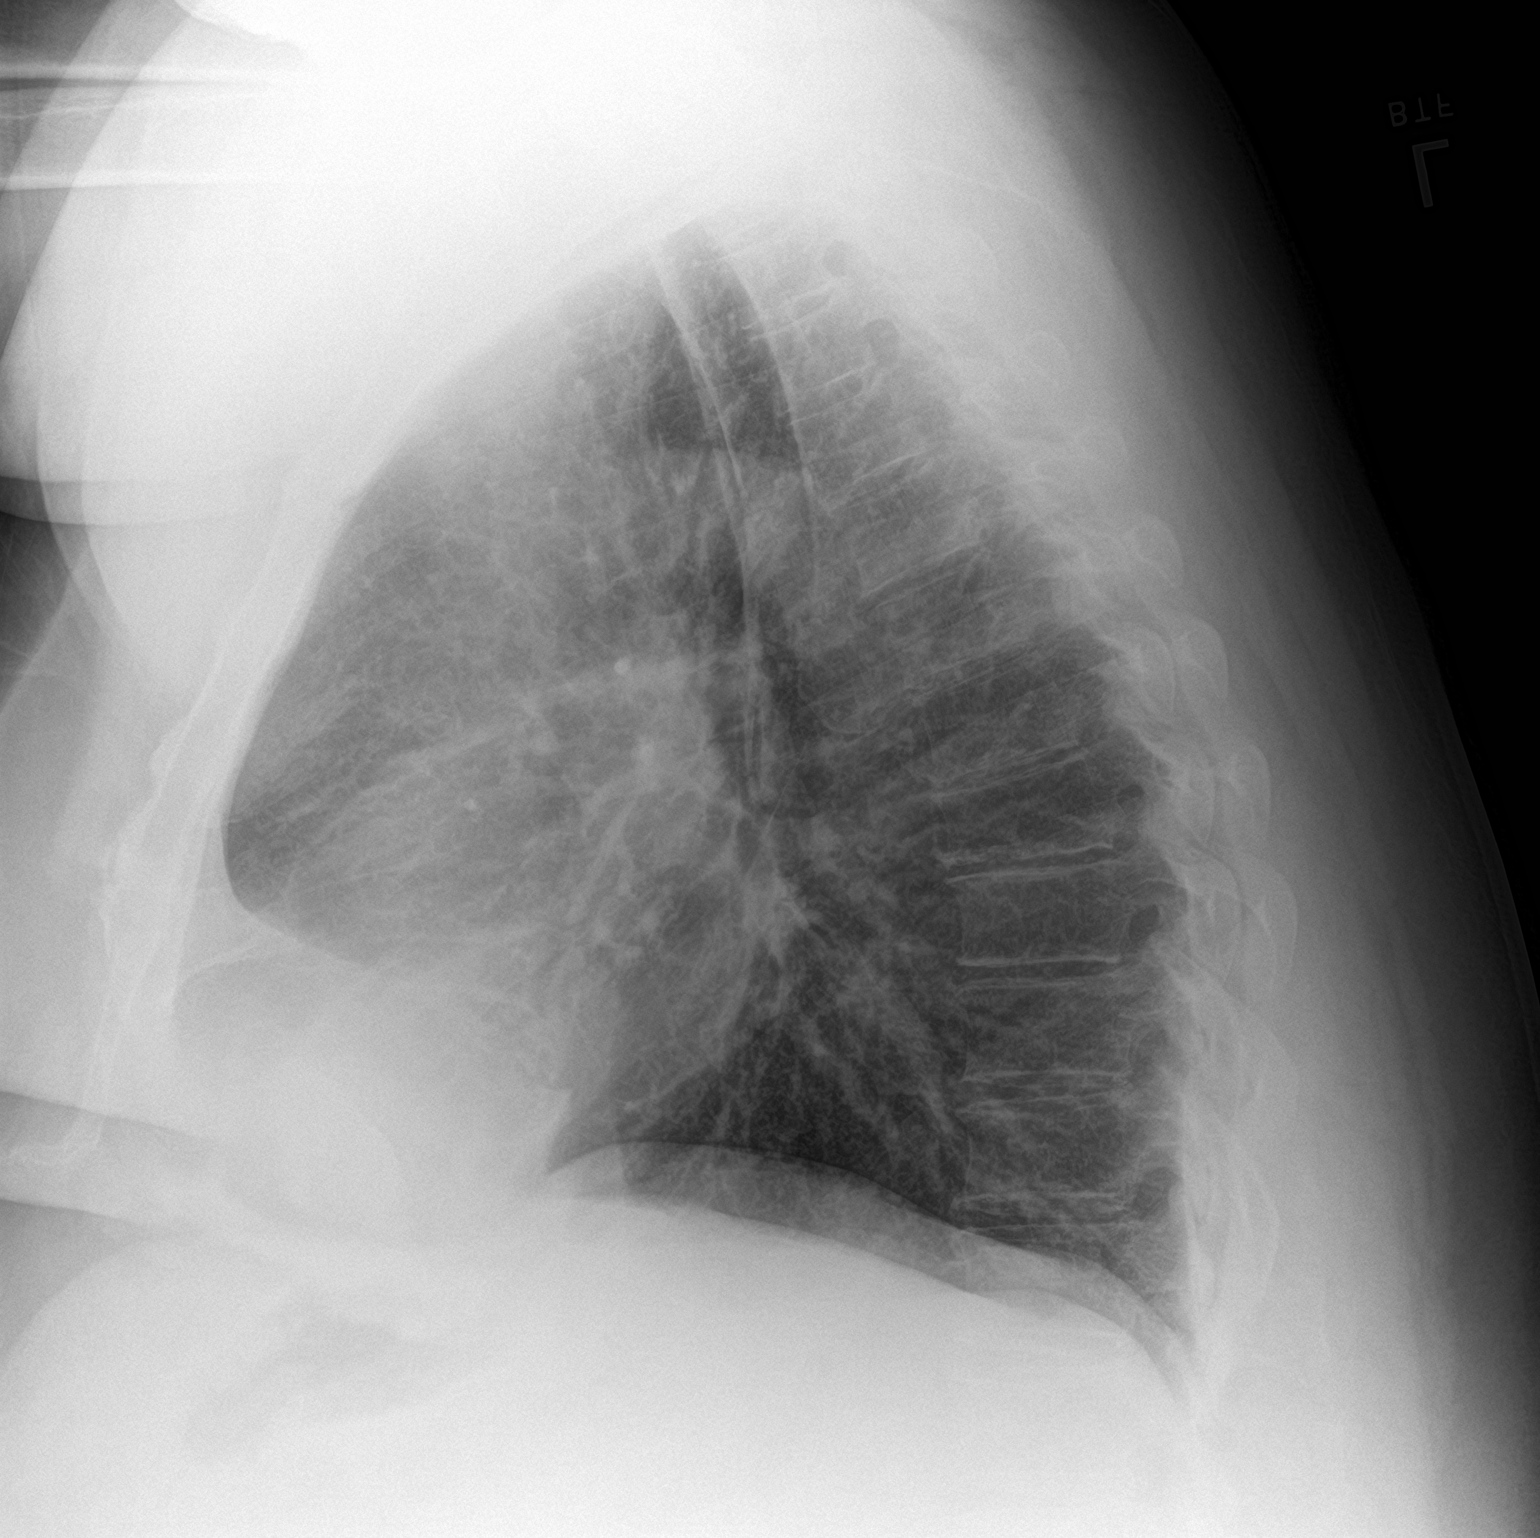

[2 of 2 positions shown; findings below may reference images not displayed]

FINDINGS: The heart size and mediastinal contours are within normal limits.
Both lungs are clear. Similar appearing hyperinflation of the lungs.
The visualized skeletal structures are unremarkable.
IMPRESSION: No acute cardiopulmonary process. Similar appearing mild
emphysematous changes.

Emphysema ([id number]-TVF.1).

## 2021-06-29 ENCOUNTER — Ambulatory Visit (INDEPENDENT_AMBULATORY_CARE_PROVIDER_SITE_OTHER): Payer: Medicare HMO | Admitting: Allergy & Immunology

## 2021-06-29 ENCOUNTER — Encounter: Payer: Self-pay | Admitting: Allergy & Immunology

## 2021-06-29 ENCOUNTER — Other Ambulatory Visit: Payer: Self-pay

## 2021-06-29 VITALS — BP 122/76 | HR 80 | Temp 98.0°F | Resp 18

## 2021-06-29 DIAGNOSIS — J449 Chronic obstructive pulmonary disease, unspecified: Secondary | ICD-10-CM

## 2021-06-29 DIAGNOSIS — J3089 Other allergic rhinitis: Secondary | ICD-10-CM | POA: Diagnosis not present

## 2021-06-29 DIAGNOSIS — J302 Other seasonal allergic rhinitis: Secondary | ICD-10-CM | POA: Diagnosis not present

## 2021-06-29 DIAGNOSIS — T7800XD Anaphylactic reaction due to unspecified food, subsequent encounter: Secondary | ICD-10-CM

## 2021-06-29 DIAGNOSIS — R6889 Other general symptoms and signs: Secondary | ICD-10-CM | POA: Diagnosis not present

## 2021-06-29 NOTE — Progress Notes (Signed)
FOLLOW UP  Date of Service/Encounter:  06/29/21   Assessment:   Seasonal and perennial allergic rhinitis (grasses, ragweed, weeds, trees, indoor molds, outdoor molds, and dust mites)   Anaphylactic shock due to food - has a shrimp challenge scheduled later this month   COPD GOLD III - followed by Dr. Melvyn Novas  Plan/Recommendations:   1. Chronic rhinitis (grasses, ragweed, weeds, trees, indoor molds, outdoor molds, and dust mites) - Continue taking: Zyrtec (cetirizine) '10mg'$  tablet once daily, Singulair (montelukast) '10mg'$  daily, and Flonase (fluticasone) one spray per nostril daily - You can use an extra dose of the antihistamine, if needed, for breakthrough symptoms.  - Consider nasal saline rinses 1-2 times daily to remove allergens from the nasal cavities as well as help with mucous clearance (this is especially helpful to do before the nasal sprays are given) - I think we can hold off on allergy shots for now.   2. Anaphylactic shock due to food - I will bring in some shrimp for the challenge. - We will see you on Wednesday August 1st.   3. COPD GOLD III  - Continue to follow with Dr. Melvyn Novas. - Continue with Breztri 2 puffs twice daily. - Continue with supplemental oxygen. - Continue with albuterol 1 nebulizer treatment or 4 puffs metered-dose inhaler every 4 hours as needed.  4. Return in about 1 year (around 06/29/2022) for a regular office visit and August 31st for the shrimp challenge.      Subjective:   Stephen Warren is a 65 y.o. male presenting today for follow up of  Chief Complaint  Patient presents with   Follow-up    Stephen Warren has a history of the following: Patient Active Problem List   Diagnosis Date Noted   Seasonal and perennial allergic rhinitis 05/18/2021   Anaphylactic shock due to adverse food reaction 05/18/2021   Upper airway cough syndrome 09/01/2020   Chronic respiratory failure with hypoxia ([place]) 05/11/2020   Morbid (severe) obesity due to  excess calories ([place]) 05/11/2020   Duodenal ulcer    Symptomatic anemia 07/03/2019   Acute upper GI bleed 07/03/2019   COPD GOLD III     Depression    Panic attacks    S/P nasal septoplasty 07/01/2018   History of colonic polyps    Diverticulosis of colon without hemorrhage    Mucosal abnormality of colon    Encounter for screening colonoscopy 10/16/2015   Hemorrhoids 10/16/2015    History obtained from: chart review and patient.  Stephen Warren is a 65 y.o. male presenting for a follow up visit.  He was last seen as a new patient in July 2022.  At that time, he had testing that was positive to grasses, ragweed, weeds, trees, indoor and outdoor molds, and dust mites.  We started him on Zyrtec as well as Flonase and Singulair.  He had testing that was negative to all the shellfish.  We did obtain lab work which showed a positive IgE to shrimp of 0.27.  He has a history of COPD and follows with Dr. Melvyn Novas.  I did not make any changes to his pulmonary regimen.  Since last visit, he has done fairly well.  Asthma/Respiratory Symptom History: He remains on the same medications . He has done fairly well. He uses the albuterol more than other times. He is currently waiting for the Breztri to come in since he ran out.  He gets all of his medicines through a mail order pharmacy.  He remains on disability  for his breathing.  Allergic Rhinitis Symptom History: He is using the prescriptions that we gave him last time. All of his symptoms seem to be managed well.  He is sitting outside more often than not. They do get worse during a certain time of the year. He does have issues when they mow the yard.   Food Allergy Symptom History: He made an appointment for a shrimp challenge. He is supposed to come in for his challenge next Friday. He does not think that he is allergic to any seasonings.  He is wondering if we can just pick up shrimp and just pay for it.  I told him I could just bring in some shrimp for the  challenge.  Otherwise, there have been no changes to his past medical history, surgical history, family history, or social history.    Review of Systems  Constitutional: Negative.  Negative for chills, fever, malaise/fatigue and weight loss.  HENT:  Negative for congestion, ear discharge, ear pain and sinus pain.   Eyes:  Negative for pain, discharge and redness.  Respiratory:  Positive for shortness of breath and wheezing. Negative for cough and sputum production.   Cardiovascular: Negative.  Negative for chest pain and palpitations.  Gastrointestinal:  Negative for abdominal pain, constipation, diarrhea, heartburn, nausea and vomiting.  Skin: Negative.  Negative for itching and rash.  Neurological:  Negative for dizziness and headaches.  Endo/Heme/Allergies:  Positive for environmental allergies. Does not bruise/bleed easily.      Objective:   Blood pressure 122/76, pulse 80, temperature 98 F (36.7 C), temperature source Temporal, resp. rate 18, SpO2 97 %. There is no height or weight on file to calculate BMI.   Physical Exam:  Physical Exam Vitals reviewed.  Constitutional:      Appearance: He is well-developed. He is obese.  HENT:     Head: Normocephalic and atraumatic.     Right Ear: Tympanic membrane, ear canal and external ear normal.     Left Ear: Tympanic membrane, ear canal and external ear normal.     Nose: No nasal deformity, septal deviation, mucosal edema or rhinorrhea.     Right Turbinates: Enlarged and swollen.     Left Turbinates: Enlarged and swollen.     Right Sinus: No maxillary sinus tenderness or frontal sinus tenderness.     Left Sinus: No maxillary sinus tenderness or frontal sinus tenderness.     Mouth/Throat:     Mouth: Mucous membranes are not pale and not dry.     Pharynx: Uvula midline.  Eyes:     General: Lids are normal. No allergic shiner.       Right eye: No discharge.        Left eye: No discharge.     Conjunctiva/sclera: Conjunctivae  normal.     Right eye: Right conjunctiva is not injected. No chemosis.    Left eye: Left conjunctiva is not injected. No chemosis.    Pupils: Pupils are equal, round, and reactive to light.  Cardiovascular:     Rate and Rhythm: Normal rate and regular rhythm.     Heart sounds: Normal heart sounds.  Pulmonary:     Effort: Pulmonary effort is normal. No tachypnea, accessory muscle usage or respiratory distress.     Breath sounds: Wheezing present. No rhonchi or rales.     Comments: Breathing shallowly.  Able to speak in full sentences.  Wearing oxygen. Chest:     Chest wall: No tenderness.  Lymphadenopathy:  Cervical: No cervical adenopathy.  Skin:    Coloration: Skin is not pale.     Findings: No abrasion, erythema, petechiae or rash. Rash is not papular, urticarial or vesicular.  Neurological:     Mental Status: He is alert.  Psychiatric:        Behavior: Behavior is cooperative.     Diagnostic studies: none       Salvatore Marvel, MD  [facility]

## 2021-06-29 NOTE — Patient Instructions (Addendum)
1. Chronic rhinitis (grasses, ragweed, weeds, trees, indoor molds, outdoor molds, and dust mites) - Continue taking: Zyrtec (cetirizine) '10mg'$  tablet once daily, Singulair (montelukast) '10mg'$  daily, and Flonase (fluticasone) one spray per nostril daily - You can use an extra dose of the antihistamine, if needed, for breakthrough symptoms.  - Consider nasal saline rinses 1-2 times daily to remove allergens from the nasal cavities as well as help with mucous clearance (this is especially helpful to do before the nasal sprays are given) - I think we can hold off on allergy shots for now.   2. Anaphylactic shock due to food - I will bring in some shrimp for the challenge. - We will see you on Wednesday August 1st.   3. COPD GOLD III  - Continue to follow with Dr. Melvyn Novas. - Continue with Breztri 2 puffs twice daily. - Continue with supplemental oxygen. - Continue with albuterol 1 nebulizer treatment or 4 puffs metered-dose inhaler every 4 hours as needed.  4. Return in about 1 year (around 06/29/2022) for a regular office visit and August 31st for the shrimp challenge.   Please inform us of any Emergency Department visits, hospitalizations, or changes in symptoms. Call us before going to the ED for breathing or allergy symptoms since we might be able to fit you in for a sick visit. Feel free to contact us anytime with any questions, problems, or concerns.  It was a pleasure to see you again today!  Websites that have reliable patient information: 1. American Academy of Asthma, Allergy, and Immunology: www.aaaai.org 2. Food Allergy Research and Education (FARE): foodallergy.org 3. Mothers of Asthmatics: http://www.asthmacommunitynetwork.org 4. American College of Allergy, Asthma, and Immunology: www.acaai.org   COVID-19 Vaccine Information can be found at: [email or web address].[email or web address].uk For questions related to vaccine distribution or appointments,  please email [email or web address] or call [phone].   We realize that you might be concerned about having an allergic reaction to the COVID19 vaccines. To help with that concern, WE ARE OFFERING THE COVID19 VACCINES IN OUR OFFICE! Ask the front desk for dates!     "Like" [place] on Facebook and Instagram for our latest updates!      A healthy democracy works best when [employer] participate! Make sure you are registered to vote! If you have moved or changed any of your contact information, you will need to get this updated before voting!  In some cases, you MAY be able to register to vote online: [email or web address]

## 2021-07-11 ENCOUNTER — Encounter: Payer: Self-pay | Admitting: Family

## 2021-07-11 ENCOUNTER — Other Ambulatory Visit: Payer: Self-pay

## 2021-07-11 ENCOUNTER — Ambulatory Visit (INDEPENDENT_AMBULATORY_CARE_PROVIDER_SITE_OTHER): Payer: Medicare HMO | Admitting: Family

## 2021-07-11 VITALS — BP 108/70 | HR 80 | Resp 20

## 2021-07-11 DIAGNOSIS — T7800XD Anaphylactic reaction due to unspecified food, subsequent encounter: Secondary | ICD-10-CM | POA: Diagnosis not present

## 2021-07-11 DIAGNOSIS — R6889 Other general symptoms and signs: Secondary | ICD-10-CM | POA: Diagnosis not present

## 2021-07-11 MED ORDER — EPINEPHRINE 0.3 MG/0.3ML IJ SOAJ: 0.3000 mg | INTRAMUSCULAR | 1 refills | Status: DC | PRN

## 2021-07-11 NOTE — Patient Instructions (Addendum)
Stephen Warren was able to tolerate the shrimp food challenge today at the office without adverse signs or symptoms of an allergic reaction. Therefore, he has the same risk of systemic reaction associated with the consumption of shellfish products as the general population.  - Do not give any shellfish products for the next 24 hours. - Monitor for allergic symptoms such as rash, wheezing, diarrhea, swelling, and vomiting for the next 24 hours. If severe symptoms occur, treat with EpiPen injection and call 911. For less severe symptoms treat with Benadryl 4 teaspoonfuls every 4 hours and call the clinic.  - If no allergic symptoms are evident, reintroduce shellfish products into the diet, 1-2 servings a week. If you develops an allergic reaction to shellfish products, record what was eaten the amount eaten, preparation method, time from ingestion to reaction, and symptoms.   Demonstration given on how to use EpiPen  Schedule a follow up appointment in 6 months or sooner if needed

## 2021-07-11 NOTE — Progress Notes (Signed)
[address], [address] [place] [id number] Dept: [phone]  FOLLOW UP NOTE  Patient ID: Stephen Warren, male    DOB: Sep 18, 1956  Age: 65 y.o. MRN: [id number] Date of Office Visit: 07/11/2021  Assessment  Chief Complaint: Food/Drug Challenge (Shrimp )  HPI Stephen Warren is a 65 year old male who presents today for an oral food challenge to shrimp.  He was last seen on June 29, 2021 by Dr. Ernst Bowler for seasonal and perennial allergic rhinitis, anaphylactic shock due to food, and COPD Gold stage III followed by Dr. Melvyn Novas.  He reports that when he was around 65 years of age he reacted to lobster with throat swelling.  He went to the hospital and has not had shellfish since.  He is able to eat fish sticks without any problems.  He reports that he is in good health today and denies cardiorespiratory, gastrointestinal, and cutaneous symptoms.  He has been off all antihistamines 3 days prior to this appointment.  All questions answered and informed consent signed.   Drug Allergies:  Allergies  Allergen Reactions   Other Anaphylaxis    Caterpillar    Iodine     Not sure of reaction   Penicillin G    Shellfish Allergy Swelling    Review of Systems: Review of Systems  Constitutional:  Negative for chills and fever.  HENT:         Reports sneezing yesterday. Denies rhinorrhea, nasal congestion and post nasal drip  Eyes:        Denies itchy watery eyes  Respiratory:  Positive for cough, shortness of breath and wheezing.        Reports his usual dry cough, occasional wheeze, and shortness of breath with exertion  Cardiovascular:  Negative for chest pain and palpitations.  Gastrointestinal:  Negative for abdominal pain, diarrhea, nausea and vomiting.       Reports that a couple of days ago he had a softer stool than normal, but was not diarrhea  Genitourinary:  Negative for frequency.  Skin:  Negative for itching and rash.  Neurological:  Negative for headaches.     Physical Exam: BP 108/70 (BP Location: Left Arm, Patient Position: Sitting, Cuff Size: Large)   Pulse 80   Resp 20   SpO2 96%    Physical Exam Constitutional:      Appearance: Normal appearance.  HENT:     Head: Normocephalic and atraumatic.     Comments: Pharynx normal, eyes normal, ears normal, nose: Bilateral lower turbinates mildly edematous and slightly erythematous with no drainage noted    Right Ear: Tympanic membrane, ear canal and external ear normal.     Left Ear: Tympanic membrane, ear canal and external ear normal.     Mouth/Throat:     Mouth: Mucous membranes are moist.     Pharynx: Oropharynx is clear.  Eyes:     Conjunctiva/sclera: Conjunctivae normal.  Cardiovascular:     Rate and Rhythm: Regular rhythm.     Heart sounds: Normal heart sounds.  Pulmonary:     Effort: Pulmonary effort is normal.     Comments: Lungs clear to auscultation, but diminished in the bases Musculoskeletal:     Cervical back: Neck supple.  Skin:    General: Skin is warm.     Comments: No rashes or urticarial lesions noted  Neurological:     Mental Status: He is alert and oriented to person, place, and time.  Psychiatric:        Mood and  Affect: Mood normal.        Behavior: Behavior normal.        Thought Content: Thought content normal.        Judgment: Judgment normal.    Diagnostics:  Open graded shrimp oral challenge: The patient was able to tolerate the challenge today without adverse signs or symptoms. Vital signs were stable throughout the challenge and observation period. He received multiple doses separated by 15 minutes, each of which was separated by vitals and a brief physical exam. He received the following doses: lip rub, 1 gm, 2 gm, 4 gm, 8 gm, and 16 gm. He was monitored for 60 minutes following the last dose. At 9:08 AM he reports that he feels a little nauseous. He denies any cardiorespiratory and cutaneous symptoms. At 9:20 AM he reports that he no longer feels  nauseous.  The patient had  negative skin prick test on 05/18/21 and Ige Shrimp 0.27 kU/L  and was able to tolerate the open graded oral challenge today without adverse signs or symptoms. Therefore, he has the same risk of systemic reaction associated with the consumption of shellfish  as the general population.   Assessment and Plan: 1. Anaphylactic shock due to food, subsequent encounter     Meds ordered this encounter  Medications   EPINEPHrine 0.3 mg/0.3 mL IJ SOAJ injection    Sig: Inject 0.3 mg into the muscle as needed for anaphylaxis.    Dispense:  1 each    Refill:  1    Please dispense generic brand mylan or teva     Patient Instructions  Stephen Warren was able to tolerate the shrimp food challenge today at the office without adverse signs or symptoms of an allergic reaction. Therefore, he has the same risk of systemic reaction associated with the consumption of shellfish products as the general population.  - Do not give any shellfish products for the next 24 hours. - Monitor for allergic symptoms such as rash, wheezing, diarrhea, swelling, and vomiting for the next 24 hours. If severe symptoms occur, treat with EpiPen injection and call 911. For less severe symptoms treat with Benadryl 4 teaspoonfuls every 4 hours and call the clinic.  - If no allergic symptoms are evident, reintroduce shellfish products into the diet, 1-2 servings a week. If you develops an allergic reaction to shellfish products, record what was eaten the amount eaten, preparation method, time from ingestion to reaction, and symptoms.   Demonstration given on how to use EpiPen  Schedule a follow up appointment in 6 months or sooner if needed  Return in about 6 months (around 01/08/2022), or if symptoms worsen or fail to improve.    Thank you for the opportunity to care for this patient.  Please do not hesitate to contact me with questions.  Althea Charon, FNP [facility]

## 2021-07-12 DIAGNOSIS — J441 Chronic obstructive pulmonary disease with (acute) exacerbation: Secondary | ICD-10-CM | POA: Diagnosis not present

## 2021-07-21 DIAGNOSIS — E1165 Type 2 diabetes mellitus with hyperglycemia: Secondary | ICD-10-CM | POA: Diagnosis not present

## 2021-07-21 DIAGNOSIS — J449 Chronic obstructive pulmonary disease, unspecified: Secondary | ICD-10-CM | POA: Diagnosis not present

## 2021-08-11 DIAGNOSIS — J441 Chronic obstructive pulmonary disease with (acute) exacerbation: Secondary | ICD-10-CM | POA: Diagnosis not present

## 2021-08-20 ENCOUNTER — Encounter: Payer: Self-pay | Admitting: Internal Medicine

## 2021-08-20 ENCOUNTER — Other Ambulatory Visit: Payer: Self-pay

## 2021-08-20 ENCOUNTER — Ambulatory Visit (INDEPENDENT_AMBULATORY_CARE_PROVIDER_SITE_OTHER): Payer: Medicare HMO | Admitting: Internal Medicine

## 2021-08-20 VITALS — BP 134/84 | HR 84 | Temp 98.6°F | Ht 73.0 in | Wt 311.0 lb

## 2021-08-20 DIAGNOSIS — R6889 Other general symptoms and signs: Secondary | ICD-10-CM | POA: Diagnosis not present

## 2021-08-20 DIAGNOSIS — Z23 Encounter for immunization: Secondary | ICD-10-CM | POA: Diagnosis not present

## 2021-08-20 DIAGNOSIS — R058 Other specified cough: Secondary | ICD-10-CM | POA: Diagnosis not present

## 2021-08-20 DIAGNOSIS — J9611 Chronic respiratory failure with hypoxia: Secondary | ICD-10-CM | POA: Diagnosis not present

## 2021-08-20 DIAGNOSIS — E1165 Type 2 diabetes mellitus with hyperglycemia: Secondary | ICD-10-CM | POA: Diagnosis not present

## 2021-08-20 DIAGNOSIS — J449 Chronic obstructive pulmonary disease, unspecified: Secondary | ICD-10-CM | POA: Diagnosis not present

## 2021-08-20 MED ORDER — AZITHROMYCIN 250 MG PO TABS: ORAL_TABLET | ORAL | 0 refills | Status: DC

## 2021-08-20 NOTE — Addendum Note (Signed)
Addended by: Christinia Gully B on: 08/20/2021 10:53 AM   Modules accepted: Orders

## 2021-08-20 NOTE — Assessment & Plan Note (Signed)
Body mass index is 41.04 kg/m.  -  trending down, encouraged Lab Results  Component Value Date   TSH 0.646 09/05/2020      Contributing to doe and risk of GERD >>>   reviewed the need and the process to achieve and maintain neg calorie balance > defer f/u primary care including intermittently monitoring thyroid status      Each maintenance medication was reviewed in detail including emphasizing most importantly the difference between maintenance and prns and under what circumstances the prns are to be triggered using an action plan format where appropriate.  Total time for H and P, chart review, counseling, reviewing hfa/02 device(s) , directly observing portions of ambulatory 02 saturation study/ and generating customized AVS unique to this office visit / same day charting  > 30 min

## 2021-08-20 NOTE — Assessment & Plan Note (Signed)
Quit smoking 2017  - PFT's  05/11/2020  FEV1 1.32 (33 % ) ratio 0.42  p 25 % improvement from saba p ? 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 08/31/2020    MM   Level 147  - .08/20/2021  After extensive coaching inhaler device,  effectiveness =  75%   Mild flare of CB > rx zpak   No change maint breztri but needs to use the saba more Appropriately: Re SABA :  I spent extra time with pt today reviewing appropriate use of albuterol for prn use on exertion with the following points: 1) saba is for relief of sob that does not improve by walking a slower pace or resting but rather if the pt does not improve after trying this first. 2) If the pt is convinced, as many are, that saba helps recover from activity faster then it's easy to tell if this is the case by re-challenging : ie stop, take the inhaler, then p 5 minutes try the exact same activity (intensity of workload) that just caused the symptoms and see if they are substantially diminished or not after saba 3) if there is an activity that reproducibly causes the symptoms, try the saba 15 min before the activity on alternate days   If in fact the saba really does help, then fine to continue to use it prn but advised may need to look closer at the maintenance regimen being used to achieve better control of airways disease with exertion.

## 2021-08-20 NOTE — Progress Notes (Addendum)
Stephen Warren, male    DOB: 02-20-1956,    MRN: [id number]   Brief patient profile:  22   yowm MM/quit smoking 2017 with h/o asthma all his life could never play sports using inhalers and nebs daily and worse 2006 met criteria for disability 2012 and arrived in [place] since 2014  And breathing worse even when quit smoking at wt  270 and followed by Luan Pulling so referred to pulmonary clinic 05/11/2020 by Dr  Legrand Rams      History of Present Illness  05/11/2020  Pulmonary/ 1st office eval/Alyha Marines on symbicort and spiriva Chief Complaint  Patient presents with   Pulmonary Consult    Referred by Dr Legrand Rams. Former Dr Luan Pulling pt.    Dyspnea:  Able to do 10 - 15 minutes in cooler air Cough: non Sleep: in recliner 30 degrees  SABA use: using hfa and neb 4 x daily  02 prn daytime  Up to 4lpm pulsed and sometimes checks sats and finds them usually 88 or above but not usually checking with ex  rec We will order Overnight pulse oximetry on Room air and let you know how you did  Make sure you check your oxygen saturations at highest level of activity Plan A = Automatic = Always=    breztri Take 2 puffs first thing in am and then another 2 puffs about 12 hours later.  Work on inhaler technique:  Plan B = Backup (to supplement plan A, not to replace it) Only use your albuterol inhaler as a rescue medication Plan C = Crisis (instead of Plan B but only if Plan B stops working) - only use your albuterol nebulizer if you first try Plan B Try albuterol 15 min before an activity that you know would make you short of breath and see if it makes any difference and if makes none then don't take it after activity unless you can't catch your breath.        08/31/2020  f/u ov/[place] office/Deanne Bedgood re: GOLD III / breztri maint/ 02 dep hs and ex  Chief Complaint  Patient presents with   Follow-up    Breathing has been worse for the past month. He is SOB walking accross the room.   Dyspnea:  In cool weather does better still  not having to stop walking every 1.5 aisle  150 ft to mb is flat stop 3/4  never checks 02 - doesn't use it either unless "gives out"  Cough:  Dry cough daytime  Sleeping: on side bed flat/ 2 pillows  SABA use: using albuterol hfa qid  02: sitting still no 02  /2 liters at hs and pulsing on 2lpm with ex but not monitoring as rec  Worse for the last month with sneezing/ dry cough daytime  rec GERD diet   Protonix 40 mg Take 30-60 min before first meal of the day  Prednisone 10 mg take  4 each am x 2 days,   2 each am x 2 days,  1 each am x 2 days and stop (don't take until after blood test) Try albuterol 15 min before an activity that you know would make you short of breath      Make sure you check your oxygen saturations at highest level of activity to be sure it stays over 90%   - Allergy profile 08/31/2020 >  Eos 0.2 /  IgE  1326 - Alpha one AT phenotype 08/31/2020    MM   Level 147  03/13/2021  f/u ov/[place] office/Chantell Kunkler re: GOLD III / atopic  Chief Complaint  Patient presents with   Follow-up    Breathing is doing okay today. He has been wheezing some, relates to pollen allergy. He is using his proair 2-3 x per day and neb 1-2 x per day.   Dyspnea:  slt improvement able to do mb s stopping  Cough: daytime dry worse with pollen - much better p prednisone Sleeping: flt bed on side two pillows  SABA use: as above  02: 2lpm hs and not checking sats with ex but uses 02 when gets sob Covid status: x 3  Rec Plan A = Automatic = Always=    Breztri Take 2 puffs first thing in am and then another 2 puffs about 12 hours later.  Work on inhaler technique:  Plan B = Backup (to supplement plan A, not to replace it) Only use your albuterol inhaler as a rescue medication  Plan C = Crisis (instead of Plan B but only if Plan B stops working) - only use your albuterol nebulizer if you first try Plan B and it fails to help > ok to use the nebulizer up to every 4 hours but if start needing it  regularly call for immediate appointment Ok to Try albuterol 15 min before (try first the proair, next day the nebulizer, next day nothing) an activity that you know would make you short of breath Prednisone 10 mg take  4 each am x 2 days,   2 each am x 2 days,  1 each am x 2 days and stop  I am referring you to an allergist in [place]  >>>>  pos allergy to pollen, trees and dust and mold Rx Zyrtec (cetirizine) 75m tablet once daily, Singulair (montelukast) 151mdaily, and Flonase (fluticasone) one daily    08/20/2021  f/u ov/[place] office/Matalynn Graff re: GOLD 3/ uacs maint on 02 hs and prn / maint on breztri  singulair/ zyrtec/ flonas  Chief Complaint  Patient presents with   Follow-up    3L O2 when SOB. 2.5L O2 when sitting. 3.5 L O2 at night time. Patient is coughing up greenish (?) mucus since last OV.   Dyspnea:  mb and and back 1/4 mile  total round trip flat not  titrating 02 as rec  Cough: ? Some better on allery rx  Sleeping: bed is flat 3 pillows SABA use: not pre challenges  02: 3 -3.5 lpm hs and 2.5 lpm at rest and 3 lpm to mb  Covid status: covid x 3  Lung cancer screening: per Dr FaLegrand Rams No obvious day to day or daytime variability or assoc  mucus plugs or hemoptysis or cp or chest tightness, subjective wheeze or overt sinus or hb symptoms.   Sleeping  without nocturnal  or early am exacerbation  of respiratory  c/o's or need for noct saba. Also denies any obvious fluctuation of symptoms with weather or environmental changes or other aggravating or alleviating factors except as outlined above   No unusual exposure hx or h/o childhood pna/ asthma or knowledge of premature birth.  Current Allergies, Complete Past Medical History, Past Surgical History, Family History, and Social History were reviewed in [employer].  ROS  The following are not active complaints unless bolded Hoarseness, sore throat, dysphagia, dental problems, itching,  sneezing,  nasal congestion or discharge of excess mucus or purulent secretions, ear ache,   fever, chills, sweats, unintended wt loss or  wt gain, classically pleuritic or exertional cp,  orthopnea pnd or arm/hand swelling  or leg swelling, presyncope, palpitations, abdominal pain, anorexia, nausea, vomiting, diarrhea  or change in bowel habits or change in bladder habits, change in stools or change in urine, dysuria, hematuria,  rash, arthralgias, visual complaints, headache, numbness, weakness or ataxia or problems with walking or coordination,  change in mood or  memory.        Current Meds  Medication Sig   albuterol (PROVENTIL) (2.5 MG/3ML) 0.083% nebulizer solution Take 2.5 mg by nebulization 4 (four) times daily.   atorvastatin (LIPITOR) 20 MG tablet    baclofen (LIORESAL) 20 MG tablet Take 20 mg by mouth daily.    Budeson-Glycopyrrol-Formoterol (BREZTRI AEROSPHERE) 160-9-4.8 MCG/ACT AERO Inhale 2 puffs into the lungs 2 (two) times daily.   cetirizine (ZYRTEC) 10 MG tablet Take 1 tablet (10 mg total) by mouth daily.   citalopram (CELEXA) 20 MG tablet Take 20 mg by mouth daily.   EPINEPHrine 0.3 mg/0.3 mL IJ SOAJ injection Inject 0.3 mg into the muscle as needed for anaphylaxis.   fluticasone (FLONASE) 50 MCG/ACT nasal spray    furosemide (LASIX) 20 MG tablet Take 20 mg by mouth daily as needed.   metFORMIN (GLUCOPHAGE) 500 MG tablet Take 500 mg by mouth 2 (two) times daily with a meal.   montelukast (SINGULAIR) 10 MG tablet Take 1 tablet (10 mg total) by mouth at bedtime.   pantoprazole (PROTONIX) 40 MG tablet TAKE 1 TABLET BY MOUTH DAILY. take 30 - 60 MINUTES BEFORE first meal of THE DAY   PROAIR HFA 108 (90 Base) MCG/ACT inhaler Inhale 1-2 puffs into the lungs 4 (four) times daily as needed for wheezing or shortness of breath.    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   NECK :  without JVD/Nodes/TM/ nl carotid upstrokes bilaterally   LUNGS: no acc muscle use,  Min barrel  contour chest wall with bilateral  slightly decreased bs s audible wheeze and  without cough on insp or exp maneuvers and min  Hyperresonant  to  percussion bilaterally     CV:  RRR  no s3 or murmur or increase in P2, and no edema  ABD:  soft and nontender with pos end  insp Hoover's  in the supine position. No bruits or organomegaly appreciated, bowel sounds nl  MS:   Nl gait/  ext warm without deformities, calf tenderness, cyanosis or clubbing No obvious joint restrictions   SKIN: warm and dry without lesions    NEURO:  alert, approp, nl sensorium with  no motor or cerebellar deficits apparent.                   Assessment

## 2021-08-20 NOTE — Patient Instructions (Addendum)
Ok to try albuterol 15 min before an activity (on alternating days)  that you know would usually make you short of breath and see if it makes any difference and if makes none then don't take albuterol after activity unless you can't catch your breath as this means it's the resting that helps, not the albuterol.      Make sure you check your oxygen saturation  at your highest level of activity  to be sure it stays over 90% and adjust  02 flow upward to maintain this level if needed but remember to turn it back to previous settings when you stop (to conserve your supply).   Work on inhaler technique:  relax and gently blow all the way out then take a nice smooth full deep breath back in, triggering the inhaler at same time you start breathing in.  Hold for up to 5 seconds if you can. Blow out thru nose. Rinse and gargle with water when done.  If mouth or throat bother you at all,  try brushing teeth/gums/tongue with arm and hammer toothpaste/ make a slurry and gargle and spit out.     I really recommend you continue lung cancer program under Dr Legrand Rams   Call me with any discrepancies in your medication list (like a balancing a checkbook)    Please schedule a follow up visit in 6  months but call sooner if needed  Add: zpak for flare CB

## 2021-08-20 NOTE — Assessment & Plan Note (Signed)
Add Protonix 40 mg daily ac 08/31/2020  - Allergy profile 08/31/2020 >  Eos 0.2 /  IgE  1326 - Referred to allergy 03/13/2021 > [facility] 06/29/21  pos allergy to pollen, trees and dust and mold Rx Zyrtec (cetirizine) 10mg  tablet once daily, Singulair (montelukast) 10mg  daily, and Flonase (fluticasone) one daily   Reviewed findings/ recs

## 2021-08-20 NOTE — Assessment & Plan Note (Signed)
Placed on 02 by Luan Pulling ? when - ONO RA 05/17/20  desat < 89% x 1 h 44 min  So rec continue noct 02 and titrate daytime to keep > 90%  -  08/31/2020   Walked RA  approx   200 ft  @ avg pace  stopped due to  Sob with sats 93% - 08/20/2021   Walked on RA x  3  lap(s) =  approx 525 ft  @  moderate pace with rollator, stopped due to sob x 2  with lowest 02 sats 96%   At present he does ok on 02 but 2lpm Pulsed is adequate  Advised again: Make sure you check your oxygen saturation  at your highest level of activity  to be sure it stays over 90% and adjust  02 flow upward to maintain this level if needed but remember to turn it back to previous settings when you stop (to conserve your supply).

## 2021-08-31 DIAGNOSIS — J449 Chronic obstructive pulmonary disease, unspecified: Secondary | ICD-10-CM | POA: Diagnosis not present

## 2021-09-11 DIAGNOSIS — J441 Chronic obstructive pulmonary disease with (acute) exacerbation: Secondary | ICD-10-CM | POA: Diagnosis not present

## 2021-09-24 DIAGNOSIS — E1165 Type 2 diabetes mellitus with hyperglycemia: Secondary | ICD-10-CM | POA: Diagnosis not present

## 2021-09-24 DIAGNOSIS — Z9981 Dependence on supplemental oxygen: Secondary | ICD-10-CM | POA: Diagnosis not present

## 2021-09-24 DIAGNOSIS — J449 Chronic obstructive pulmonary disease, unspecified: Secondary | ICD-10-CM | POA: Diagnosis not present

## 2021-09-24 DIAGNOSIS — R6889 Other general symptoms and signs: Secondary | ICD-10-CM | POA: Diagnosis not present

## 2021-09-24 DIAGNOSIS — M6281 Muscle weakness (generalized): Secondary | ICD-10-CM | POA: Diagnosis not present

## 2021-10-20 ENCOUNTER — Other Ambulatory Visit: Payer: Self-pay | Admitting: Internal Medicine

## 2021-10-20 DIAGNOSIS — J449 Chronic obstructive pulmonary disease, unspecified: Secondary | ICD-10-CM

## 2021-10-24 DIAGNOSIS — J449 Chronic obstructive pulmonary disease, unspecified: Secondary | ICD-10-CM | POA: Diagnosis not present

## 2021-10-24 DIAGNOSIS — E1165 Type 2 diabetes mellitus with hyperglycemia: Secondary | ICD-10-CM | POA: Diagnosis not present

## 2021-10-29 DIAGNOSIS — R6889 Other general symptoms and signs: Secondary | ICD-10-CM | POA: Diagnosis not present

## 2021-11-01 ENCOUNTER — Other Ambulatory Visit: Payer: Self-pay | Admitting: Allergy & Immunology

## 2021-11-11 DIAGNOSIS — J441 Chronic obstructive pulmonary disease with (acute) exacerbation: Secondary | ICD-10-CM | POA: Diagnosis not present

## 2021-11-12 DIAGNOSIS — R531 Weakness: Secondary | ICD-10-CM | POA: Diagnosis not present

## 2021-11-24 DIAGNOSIS — E1165 Type 2 diabetes mellitus with hyperglycemia: Secondary | ICD-10-CM | POA: Diagnosis not present

## 2021-11-24 DIAGNOSIS — J9611 Chronic respiratory failure with hypoxia: Secondary | ICD-10-CM | POA: Diagnosis not present

## 2021-12-12 DIAGNOSIS — J441 Chronic obstructive pulmonary disease with (acute) exacerbation: Secondary | ICD-10-CM | POA: Diagnosis not present

## 2021-12-13 DIAGNOSIS — R531 Weakness: Secondary | ICD-10-CM | POA: Diagnosis not present

## 2021-12-19 DIAGNOSIS — H2513 Age-related nuclear cataract, bilateral: Secondary | ICD-10-CM | POA: Diagnosis not present

## 2021-12-19 DIAGNOSIS — H5203 Hypermetropia, bilateral: Secondary | ICD-10-CM | POA: Diagnosis not present

## 2021-12-19 DIAGNOSIS — Z7984 Long term (current) use of oral hypoglycemic drugs: Secondary | ICD-10-CM | POA: Diagnosis not present

## 2021-12-19 DIAGNOSIS — E119 Type 2 diabetes mellitus without complications: Secondary | ICD-10-CM | POA: Diagnosis not present

## 2021-12-19 DIAGNOSIS — H524 Presbyopia: Secondary | ICD-10-CM | POA: Diagnosis not present

## 2021-12-25 DIAGNOSIS — J449 Chronic obstructive pulmonary disease, unspecified: Secondary | ICD-10-CM | POA: Diagnosis not present

## 2021-12-25 DIAGNOSIS — E1165 Type 2 diabetes mellitus with hyperglycemia: Secondary | ICD-10-CM | POA: Diagnosis not present

## 2022-01-09 ENCOUNTER — Encounter: Payer: Self-pay | Admitting: Allergy & Immunology

## 2022-01-09 ENCOUNTER — Ambulatory Visit (INDEPENDENT_AMBULATORY_CARE_PROVIDER_SITE_OTHER): Payer: Medicare Other | Admitting: Allergy & Immunology

## 2022-01-09 ENCOUNTER — Other Ambulatory Visit: Payer: Self-pay

## 2022-01-09 VITALS — BP 134/84 | HR 77 | Temp 98.3°F | Resp 16 | Ht 71.0 in | Wt 280.0 lb

## 2022-01-09 DIAGNOSIS — J3089 Other allergic rhinitis: Secondary | ICD-10-CM

## 2022-01-09 DIAGNOSIS — J449 Chronic obstructive pulmonary disease, unspecified: Secondary | ICD-10-CM | POA: Diagnosis not present

## 2022-01-09 DIAGNOSIS — J302 Other seasonal allergic rhinitis: Secondary | ICD-10-CM

## 2022-01-09 DIAGNOSIS — T7800XD Anaphylactic reaction due to unspecified food, subsequent encounter: Secondary | ICD-10-CM

## 2022-01-09 DIAGNOSIS — J441 Chronic obstructive pulmonary disease with (acute) exacerbation: Secondary | ICD-10-CM | POA: Diagnosis not present

## 2022-01-09 MED ORDER — AZELASTINE HCL 0.1 % NA SOLN: 1.0000 | Freq: Two times a day (BID) | NASAL | 3 refills | Status: DC

## 2022-01-09 MED ORDER — CETIRIZINE HCL 10 MG PO TABS: 10.0000 mg | ORAL_TABLET | Freq: Every day | ORAL | 3 refills | Status: DC

## 2022-01-09 MED ORDER — FLUTICASONE PROPIONATE 50 MCG/ACT NA SUSP: 1.0000 | Freq: Two times a day (BID) | NASAL | 3 refills | Status: DC

## 2022-01-09 MED ORDER — MONTELUKAST SODIUM 10 MG PO TABS: 10.0000 mg | ORAL_TABLET | Freq: Every day | ORAL | 3 refills | Status: DC

## 2022-01-09 NOTE — Patient Instructions (Addendum)
1. Chronic rhinitis (grasses, ragweed, weeds, trees, indoor molds, outdoor molds, and dust mites) ?- Continue taking: Zyrtec (cetirizine) 10mg  tablet once daily, Singulair (montelukast) 10mg  daily, and Flonase (fluticasone) one spray per nostril daily ?- Start taking: Astelin one spray per nostril twice daily (can be used WITH the fluticasone) ?- You can use an extra dose of the antihistamine, if needed, for breakthrough symptoms.  ?- Consider nasal saline rinses 1-2 times daily to remove allergens from the nasal cavities as well as help with mucous clearance (this is especially helpful to do before the nasal sprays are given) ?- Information on allergy shots provided for better control of your symptoms.  ? ?2. 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  ? ?3. COPD GOLD III  ?- Continue to follow with Dr. Melvyn Novas. ?- Continue with Breztri 2 puffs twice daily. ?- Continue with supplemental oxygen. ?- Continue with albuterol 1 nebulizer treatment or 4 puffs metered-dose inhaler every 4 hours as needed. ? ?4. Return in about 6 months (around 07/12/2022).  ? ? ?Please inform us of any Emergency Department visits, hospitalizations, or changes in symptoms. Call us before going to the ED for breathing or allergy symptoms since we might be able to fit you in for a sick visit. Feel free to contact us anytime with any questions, problems, or concerns. ? ?It was a pleasure to see you again today! ? ?Websites that have reliable patient information: ?1. American Academy of Asthma, Allergy, and Immunology: www.aaaai.org ?2. Food Allergy Research and Education (FARE): foodallergy.org ?3. Mothers of Asthmatics: http://www.asthmacommunitynetwork.org ?4. [employer] of Allergy, Asthma, and Immunology: MonthlyElectricBill.co.uk ? ? ?COVID-19 Vaccine Information can be found at: ShippingScam.co.uk For questions related to vaccine distribution or  appointments, please email [email or web address] or call [phone].  ? ?We realize that you might be concerned about having an allergic reaction to the COVID19 vaccines. To help with that concern, WE ARE OFFERING THE COVID19 VACCINES IN OUR OFFICE! Ask the front desk for dates!  ? ? ? ??Like? [place] on Facebook and Instagram for our latest updates!  ?  ? ? ?A healthy democracy works best when [employer] participate! Make sure you are registered to vote! If you have moved or changed any of your contact information, you will need to get this updated before voting! ? ?In some cases, you MAY be able to register to vote online: [email or web address] ? ? ? ? ? ?Allergy Shots  ? ?Allergies are the result of a chain reaction that starts in the immune system. Your immune system controls how your body defends itself. For instance, if you have an allergy to pollen, your immune system identifies pollen as an invader or allergen. Your immune system overreacts by producing antibodies called Immunoglobulin E (IgE). These antibodies travel to cells that release chemicals, causing an allergic reaction. ? ?The concept behind allergy immunotherapy, whether it is received in the form of shots or tablets, is that the immune system can be desensitized to specific allergens that trigger allergy symptoms. Although it requires time and patience, the payback can be long-term relief. ? ?How Do Allergy Shots Work? ? ?Allergy shots work much like a vaccine. Your body responds to injected amounts of a particular allergen given in increasing doses, eventually developing a resistance and tolerance to it. Allergy shots can lead to decreased, minimal or no allergy symptoms. ? ?There generally are two phases: build-up and maintenance. Build-up often ranges from three to six months and  involves receiving injections with increasing amounts of the allergens. The shots are typically given once or twice a week, though more rapid  build-up schedules are sometimes used. ? ?The maintenance phase begins when the most effective dose is reached. This dose is different for each person, depending on how allergic you are and your response to the build-up injections. Once the maintenance dose is reached, there are longer periods between injections, typically two to four weeks. ? ?Occasionally doctors give cortisone-type shots that can temporarily reduce allergy symptoms. These types of shots are different and should not be confused with allergy immunotherapy shots. ? ?Who Can Be Treated with Allergy Shots? ? ?Allergy shots may be a good treatment approach for people with allergic rhinitis (hay fever), allergic asthma, conjunctivitis (eye allergy) or stinging insect allergy.  ? ?Before deciding to begin allergy shots, you should consider: ? ? The length of allergy season and the severity of your symptoms ? Whether medications and/or changes to your environment can control your symptoms ? Your desire to avoid long-term medication use ? Time: allergy immunotherapy requires a major time commitment ? Cost: may vary depending on your insurance coverage ? ?Allergy shots for children age 52 and older are effective and often well tolerated. They might prevent the onset of new allergen sensitivities or the progression to asthma. ? ?Allergy shots are not started on patients who are pregnant but can be continued on patients who become pregnant while receiving them. In some patients with other medical conditions or who take certain common medications, allergy shots may be of risk. It is important to mention other medications you talk to your allergist.  ? ?When Will I Feel Better? ? ?Some may experience decreased allergy symptoms during the build-up phase. For others, it may take as long as 12 months on the maintenance dose. If there is no improvement after a year of maintenance, your allergist will discuss other treatment options with you. ? ?If you aren?t  responding to allergy shots, it may be because there is not enough dose of the allergen in your vaccine or there are missing allergens that were not identified during your allergy testing. Other reasons could be that there are high levels of the allergen in your environment or major exposure to non-allergic triggers like tobacco smoke. ? ?What Is the Length of Treatment? ? ?Once the maintenance dose is reached, allergy shots are generally continued for three to five years. The decision to stop should be discussed with your allergist at that time. Some people may experience a permanent reduction of allergy symptoms. Others may relapse and a longer course of allergy shots can be considered. ? ?What Are the Possible Reactions? ? ?The two types of adverse reactions that can occur with allergy shots are local and systemic. Common local reactions include very mild redness and swelling at the injection site, which can happen immediately or several hours after. A systemic reaction, which is less common, affects the entire body or a particular body system. They are usually mild and typically respond quickly to medications. Signs include increased allergy symptoms such as sneezing, a stuffy nose or hives. ? ?Rarely, a serious systemic reaction called anaphylaxis can develop. Symptoms include swelling in the throat, wheezing, a feeling of tightness in the chest, nausea or dizziness. Most serious systemic reactions develop within 30 minutes of allergy shots. This is why it is strongly recommended you wait in your doctor?s office for 30 minutes after your injections. Your allergist is trained to watch  for reactions, and his or her staff is trained and equipped with the proper medications to identify and treat them. ? ?Who Should Administer Allergy Shots? ? ?The preferred location for receiving shots is your prescribing allergist?s office. Injections can sometimes be given at another facility where the physician and staff are  trained to recognize and treat reactions, and have received instructions by your prescribing allergist. ? ? ? ? ?

## 2022-01-09 NOTE — Progress Notes (Signed)
? ?FOLLOW UP ? ?Date of Service/Encounter:  01/09/22 ? ? ?Assessment:  ? ?Seasonal and perennial allergic rhinitis (grasses, ragweed, weeds, trees, indoor molds, outdoor molds, and dust mites) ?  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  ?COPD GOLD III - followed by Dr. Melvyn Novas ? ?Plan/Recommendations:  ? ?1. Chronic rhinitis (grasses, ragweed, weeds, trees, indoor molds, outdoor molds, and dust mites) ?- Continue taking: Zyrtec (cetirizine) 10mg  tablet once daily, Singulair (montelukast) 10mg  daily, and Flonase (fluticasone) one spray per nostril daily ?- Start taking: Astelin one spray per nostril twice daily (can be used WITH the fluticasone) ?- You can use an extra dose of the antihistamine, if needed, for breakthrough symptoms.  ?- Consider nasal saline rinses 1-2 times daily to remove allergens from the nasal cavities as well as help with mucous clearance (this is especially helpful to do before the nasal sprays are given) ?- Information on allergy shots provided for better control of your symptoms.  ? ?2. 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  ? ?3. COPD GOLD III  ?- Continue to follow with Dr. Melvyn Novas. ?- Continue with Breztri 2 puffs twice daily. ?- Continue with supplemental oxygen. ?- Continue with albuterol 1 nebulizer treatment or 4 puffs metered-dose inhaler every 4 hours as needed. ? ?4. Return in about 6 months (around 07/12/2022).  ? ? ?Subjective:  ? ?Stephen Warren is a 66 y.o. male presenting today for follow up of  ?Chief Complaint  ?Patient presents with  ? Cough  ? Allergic Rhinitis   ?  Sneezing - has been using over the counter allergy medications   ? ? ?Stephen Warren has a history of the following: ?Patient Active Problem List  ? Diagnosis Date Noted  ? Seasonal and perennial allergic rhinitis 05/18/2021  ? Anaphylactic shock due to adverse food reaction 05/18/2021  ? Upper airway cough syndrome 09/01/2020  ? Chronic respiratory failure  with hypoxia ([place]) 05/11/2020  ? Morbid (severe) obesity due to excess calories ([place]) 05/11/2020  ? Duodenal ulcer   ? Symptomatic anemia 07/03/2019  ? Acute upper GI bleed 07/03/2019  ? COPD GOLD III    ? Depression   ? Panic attacks   ? S/P nasal septoplasty 07/01/2018  ? History of colonic polyps   ? Diverticulosis of colon without hemorrhage   ? Mucosal abnormality of colon   ? Encounter for screening colonoscopy 10/16/2015  ? Hemorrhoids 10/16/2015  ? ? ?History obtained from: chart review and patient. ? ?Stephen Warren is a 66 y.o. male presenting for a follow up visit.  He was last seen in August 2022 by Althea Charon, one of our esteemed nurse practitioners.  At that time, he passed a shrimp challenge with flying colors! ? ?Since the last visit, he has done fairly well. He has not had shrimp since the last visit. He is going to be eating some more in the spring.  ? ?Asthma/Respiratory Symptom History: He continues to follow with Dr. Melvyn Novas.  He sees him every 4 months or so. He has not needed any steroids at all.  ? ?Allergic Rhinitis Symptom History: He is doing so so from an allergy perspective. He does have some sneezing that has gotten worse lately. It is that time of the year and he not  been a happy camper. He remains on the cetirizine, montelukast, and fluticasone. Even with this combination, he does have problems.  ? ?Otherwise, there have been no  changes to his past medical history, surgical history, family history, or social history. ? ? ? ?Review of Systems  ?Constitutional:  Negative for chills and fever.  ?HENT:    ?     Reports sneezing yesterday. Denies rhinorrhea, nasal congestion and post nasal drip  ?Eyes:   ?     Denies itchy watery eyes  ?Respiratory:  Positive for cough and shortness of breath. Negative for wheezing.   ?     Reports his usual dry cough, occasional wheeze, and shortness of breath with exertion  ?Cardiovascular:  Negative for chest pain and palpitations.  ?Gastrointestinal:   Negative for abdominal pain, diarrhea, nausea and vomiting.  ?     Reports that a couple of days ago he had a softer stool than normal, but was not diarrhea  ?Genitourinary:  Negative for frequency.  ?Skin:  Negative for itching and rash.  ?Neurological:  Negative for headaches.   ? ? ? ?Objective:  ? ?Blood pressure 134/84, pulse 77, temperature 98.3 ?F (36.8 ?C), resp. rate 16, height 5\' 11"  (1.803 m), weight 280 lb (127 kg), SpO2 96 %. ?Body mass index is 39.05 kg/m?. ? ? ? ?Physical Exam ?Vitals reviewed.  ?Constitutional:   ?   Appearance: He is well-developed. He is obese.  ?   Comments: Very joyful.   ?HENT:  ?   Head: Normocephalic and atraumatic.  ?   Right Ear: Tympanic membrane, ear canal and external ear normal.  ?   Left Ear: Tympanic membrane, ear canal and external ear normal.  ?   Nose: No nasal deformity, septal deviation or mucosal edema.  ?   Right Turbinates: Enlarged, swollen and pale.  ?   Left Turbinates: Enlarged, swollen and pale.  ?   Right Sinus: No maxillary sinus tenderness or frontal sinus tenderness.  ?   Left Sinus: No maxillary sinus tenderness or frontal sinus tenderness.  ?   Comments: No nasal polyps. Clear rhinorrhea.  ?   Mouth/Throat:  ?   Mouth: Mucous membranes are not pale and not dry.  ?   Pharynx: Uvula midline.  ?Eyes:  ?   General: Lids are normal. Allergic shiner present.     ?   Right eye: No discharge.     ?   Left eye: No discharge.  ?   Conjunctiva/sclera: Conjunctivae normal.  ?   Right eye: Right conjunctiva is not injected. No chemosis. ?   Left eye: Left conjunctiva is not injected. No chemosis. ?   Pupils: Pupils are equal, round, and reactive to light.  ?Cardiovascular:  ?   Rate and Rhythm: Normal rate and regular rhythm.  ?   Heart sounds: Normal heart sounds.  ?Pulmonary:  ?   Effort: Pulmonary effort is normal. No tachypnea, accessory muscle usage or respiratory distress.  ?   Breath sounds: Wheezing present. No rhonchi or rales.  ?   Comments: Breathing  shallowly.  Able to speak in full sentences.  Wearing oxygen. ?Chest:  ?   Chest wall: No tenderness.  ?Lymphadenopathy:  ?   Cervical: No cervical adenopathy.  ?Skin: ?   General: Skin is warm.  ?   Capillary Refill: Capillary refill takes less than 2 seconds.  ?   Coloration: Skin is not pale.  ?   Findings: No abrasion, erythema, petechiae or rash. Rash is not papular, urticarial or vesicular.  ?   Comments: No eczematous lesions noted.   ?Neurological:  ?   Mental Status: He  is alert.  ?Psychiatric:     ?   Behavior: Behavior is cooperative.  ?  ? ?Diagnostic studies: none ? ? ? ?  ?Salvatore Marvel, MD  ?[facility] ? ? ? ? ? ? ?

## 2022-01-10 ENCOUNTER — Telehealth: Payer: Self-pay

## 2022-01-10 DIAGNOSIS — R531 Weakness: Secondary | ICD-10-CM | POA: Diagnosis not present

## 2022-01-10 MED ORDER — MONTELUKAST SODIUM 10 MG PO TABS: 10.0000 mg | ORAL_TABLET | Freq: Every day | ORAL | 1 refills | Status: DC

## 2022-01-10 MED ORDER — CETIRIZINE HCL 10 MG PO TABS: 10.0000 mg | ORAL_TABLET | Freq: Two times a day (BID) | ORAL | 1 refills | Status: DC | PRN

## 2022-01-10 MED ORDER — AZELASTINE HCL 0.1 % NA SOLN: 1.0000 | Freq: Two times a day (BID) | NASAL | 1 refills | Status: DC

## 2022-01-10 MED ORDER — FLUTICASONE PROPIONATE 50 MCG/ACT NA SUSP: 1.0000 | Freq: Two times a day (BID) | NASAL | 1 refills | Status: DC

## 2022-01-10 NOTE — Addendum Note (Signed)
Addended by: Clovis Cao A on: 01/10/2022 12:21 PM ? ? Modules accepted: Orders ? ?

## 2022-01-10 NOTE — Telephone Encounter (Signed)
Called and left a detailed voicemail per DPR permission advising patient that medications have been sent in to requested pharmacy.  ?

## 2022-01-10 NOTE — Telephone Encounter (Signed)
Patient called stating his medications were sent to the wrong pharmacy. The patient states he wants to use [facility].  ?Please take out the [place]. ? ? ?

## 2022-01-22 DIAGNOSIS — E1165 Type 2 diabetes mellitus with hyperglycemia: Secondary | ICD-10-CM | POA: Diagnosis not present

## 2022-01-22 DIAGNOSIS — J449 Chronic obstructive pulmonary disease, unspecified: Secondary | ICD-10-CM | POA: Diagnosis not present

## 2022-01-23 DIAGNOSIS — J449 Chronic obstructive pulmonary disease, unspecified: Secondary | ICD-10-CM | POA: Diagnosis not present

## 2022-02-09 DIAGNOSIS — J441 Chronic obstructive pulmonary disease with (acute) exacerbation: Secondary | ICD-10-CM | POA: Diagnosis not present

## 2022-02-10 DIAGNOSIS — R531 Weakness: Secondary | ICD-10-CM | POA: Diagnosis not present

## 2022-02-22 DIAGNOSIS — J449 Chronic obstructive pulmonary disease, unspecified: Secondary | ICD-10-CM | POA: Diagnosis not present

## 2022-02-22 DIAGNOSIS — E1165 Type 2 diabetes mellitus with hyperglycemia: Secondary | ICD-10-CM | POA: Diagnosis not present

## 2022-02-25 ENCOUNTER — Encounter: Payer: Self-pay | Admitting: Internal Medicine

## 2022-02-25 ENCOUNTER — Ambulatory Visit (INDEPENDENT_AMBULATORY_CARE_PROVIDER_SITE_OTHER): Payer: Medicare Other | Admitting: Internal Medicine

## 2022-02-25 VITALS — BP 132/88 | HR 76 | Temp 97.6°F | Ht 71.0 in | Wt 298.0 lb

## 2022-02-25 DIAGNOSIS — R058 Other specified cough: Secondary | ICD-10-CM

## 2022-02-25 DIAGNOSIS — J9611 Chronic respiratory failure with hypoxia: Secondary | ICD-10-CM

## 2022-02-25 DIAGNOSIS — J449 Chronic obstructive pulmonary disease, unspecified: Secondary | ICD-10-CM | POA: Diagnosis not present

## 2022-02-25 DIAGNOSIS — Z87891 Personal history of nicotine dependence: Secondary | ICD-10-CM

## 2022-02-25 MED ORDER — METHYLPREDNISOLONE ACETATE 80 MG/ML IJ SUSP
80.0000 mg | Freq: Once | INTRAMUSCULAR | Status: AC
  Administered 2022-02-25: 80 mg via INTRAMUSCULAR

## 2022-02-25 NOTE — Progress Notes (Signed)
Stephen Warren, male    DOB: 04/06/1956,    MRN: [id number] ? ? ?Brief patient profile:  ?32  yowm MM/quit smoking 2017 with h/o asthma all his life could never play sports using inhalers and nebs daily and worse 2006 met criteria for disability 2012 and arrived in [place] since 2014  And breathing worse even when quit smoking at wt  89 and followed by Luan Pulling so referred to pulmonary clinic 05/11/2020 by Dr  Legrand Rams  ? ? ? ? ?History of Present Illness  ?05/11/2020  Pulmonary/ 1st office eval/Derrick Orris on symbicort and spiriva ?Chief Complaint  ?Patient presents with  ? Pulmonary Consult  ?  Referred by Dr Legrand Rams. Former Dr Luan Pulling pt.    ?Dyspnea:  Able to do 10 - 15 minutes in cooler air ?Cough: non ?Sleep: in recliner 30 degrees  ?SABA use: using hfa and neb 4 x daily  ?02 prn daytime  Up to 4lpm pulsed and sometimes checks sats and finds them usually 88 or above but not usually checking with ex  ?rec ?We will order Overnight pulse oximetry on Room air and let you know how you did  ?Make sure you check your oxygen saturations at highest level of activity ?Plan A = Automatic = Always=    breztri Take 2 puffs first thing in am and then another 2 puffs about 12 hours later.  ?Work on inhaler technique:  ?Plan B = Backup (to supplement plan A, not to replace it) ?Only use your albuterol inhaler as a rescue medication ?Plan C = Crisis (instead of Plan B but only if Plan B stops working) ?- only use your albuterol nebulizer if you first try Plan B ?Try albuterol 15 min before an activity that you know would make you short of breath and see if it makes any difference and if makes none then don't take it after activity unless you can't catch your breath. ?    ?  ? ?08/31/2020  f/u ov/[place] office/Previn Jian re: GOLD III / breztri maint/ 02 dep hs and ex  ?Chief Complaint  ?Patient presents with  ? Follow-up  ?  Breathing has been worse for the past month. He is SOB walking accross the room.  ? Dyspnea:  In cool weather does better still  not having to stop walking every 1.5 aisle  ?150 ft to mb is flat stop 3/4  never checks 02 - doesn't use it either unless "gives out"  ?Cough:  Dry cough daytime  ?Sleeping: on side bed flat/ 2 pillows  ?SABA use: using albuterol hfa qid  ?02: sitting still no 02  /2 liters at hs and pulsing on 2lpm with ex but not monitoring as rec  ?Worse for the last month with sneezing/ dry cough daytime  ?rec ?GERD diet   ?Protonix 40 mg Take 30-60 min before first meal of the day  ?Prednisone 10 mg take  4 each am x 2 days,   2 each am x 2 days,  1 each am x 2 days and stop (don't take until after blood test) ?Try albuterol 15 min before an activity that you know would make you short of breath      ?Make sure you check your oxygen saturations at highest level of activity to be sure it stays over 90%   ?- Allergy profile 08/31/2020 >  Eos 0.2 /  IgE  1326 ?- Alpha one AT phenotype 08/31/2020    MM   Level 147  ? ? ?03/13/2021  f/u ov/[place] office/Eulogia Dismore re: GOLD III / atopic  ?Chief Complaint  ?Patient presents with  ? Follow-up  ?  Breathing is doing okay today. He has been wheezing some, relates to pollen allergy. He is using his proair 2-3 x per day and neb 1-2 x per day.   ?Dyspnea:  slt improvement able to do mb s stopping  ?Cough: daytime dry worse with pollen - much better p prednisone ?Sleeping: flt bed on side two pillows  ?SABA use: as above  ?02: 2lpm hs and not checking sats with ex but uses 02 when gets sob ?Covid status: x 3  ?Rec ?Plan A = Automatic = Always=    Breztri Take 2 puffs first thing in am and then another 2 puffs about 12 hours later.  ?Work on inhaler technique:  ?Plan B = Backup (to supplement plan A, not to replace it) ?Only use your albuterol inhaler as a rescue medication  ?Plan C = Crisis (instead of Plan B but only if Plan B stops working) ?- only use your albuterol nebulizer if you first try Plan B and it fails to help > ok to use the nebulizer up to every 4 hours but if start needing it  regularly call for immediate appointment ?Ok to Try albuterol 15 min before (try first the proair, next day the nebulizer, next day nothing) an activity that you know would make you short of breath ?Prednisone 10 mg take  4 each am x 2 days,   2 each am x 2 days,  1 each am x 2 days and stop  ?I am referring you to an allergist in [place]  ?>>>>  pos allergy to pollen, trees and dust and mold ?Rx Zyrtec (cetirizine) 39m tablet once daily, Singulair (montelukast) 154mdaily, and Flonase (fluticasone) one daily  ? ? ?08/20/2021  f/u ov/[place] office/Yardley Lekas re: GOLD 3/ uacs maint on 02 hs and prn / maint on breztri  singulair/ zyrtec/ flonas  ?Chief Complaint  ?Patient presents with  ? Follow-up  ?  3L O2 when SOB. 2.5L O2 when sitting. 3.5 L O2 at night time. Patient is coughing up greenish (?) mucus since last OV.   ?Dyspnea:  mb and and back 1/4 mile  total round trip flat not  titrating 02 as rec  ?Cough: ? Some better on allery rx  ?Sleeping: bed is flat 3 pillows ?SABA use: not pre challenging  ?02: 3 -3.5 lpm hs and 2.5 lpm at rest and 3 lpm to mb  ?Covid status: covid x 3  ?Lung cancer screening: per Dr FaLegrand RamsRec ?Ok to try albuterol 15 min before an activity (on alternating days)  that you know would usually make you short of breath   ? Make sure you check your oxygen saturation  at your highest level of activity  to be sure it stays over 90% ?Work on inhaler technique:   ?I really recommend you continue lung cancer program under Dr FaLegrand Rams?Call me with any discrepancies in your medication list (like a balancing a checkbook)  ? ?Add: zpak for flare CB ? ? ?02/25/2022  f/u ov/[place] office/Ilyas Lipsitz re: GOLD 3 / atopic maint on breztr 2bid /sLaurine Blazer?Chief Complaint  ?Patient presents with  ? Follow-up  ?  Using 2.5LO2 cont sitting  ?3.5LO2 cont at night.  ?Breathing is about the same. Pollen bothering patient lately.   ?Dyspnea:  mb and back with rollator and sats  on 3lpm pulse usually maint around  96%  ?Cough: more with pollen with sneezing ?Sleeping: flat bed 3 pillows  ?SABA use: still not prechallenging  ?02: 3lpm  ?Covid status: vax x 4 never infected  ?Lung cancer screening: referred back 02/25/2022  ? ? ?No obvious day to day or daytime variability or assoc excess/ purulent sputum or mucus plugs or hemoptysis or cp or chest tightness, subjective wheeze or overt sinus or hb symptoms.  ? ?Sleeping  without nocturnal  or early am exacerbation  of respiratory  c/o's or need for noct saba. Also denies any obvious fluctuation of symptoms with weather or environmental changes or other aggravating or alleviating factors except as outlined above  ? ?No unusual exposure hx or h/o childhood pna/ asthma or knowledge of premature birth. ? ?Current Allergies, Complete Past Medical History, Past Surgical History, Family History, and Social History were reviewed in [employer] record. ? ?ROS  The following are not active complaints unless bolded ?Hoarseness, sore throat, dysphagia, dental problems, itching, sneezing,  nasal congestion or discharge of excess mucus or purulent secretions, ear ache,   fever, chills, sweats, unintended wt loss or wt gain, classically pleuritic or exertional cp,  orthopnea pnd or arm/hand swelling  or leg swelling, presyncope, palpitations, abdominal pain, anorexia, nausea, vomiting, diarrhea  or change in bowel habits or change in bladder habits, change in stools or change in urine, dysuria, hematuria,  rash, arthralgias, visual complaints, headache, numbness, weakness or ataxia or problems with walking or coordination,  change in mood or  memory. ?      ? ?Current Meds  ?Medication Sig  ? albuterol (PROVENTIL) (2.5 MG/3ML) 0.083% nebulizer solution Take 2.5 mg by nebulization 4 (four) times daily.  ? atorvastatin (LIPITOR) 20 MG tablet   ? azelastine (ASTELIN) 0.1 % nasal spray Place 1 spray into both nostrils 2 (two) times daily. Use in each nostril as directed  ?  baclofen (LIORESAL) 20 MG tablet Take 20 mg by mouth daily.   ? cetirizine (ZYRTEC) 10 MG tablet Take 1 tablet (10 mg total) by mouth 2 (two) times daily as needed for allergies (Can take an extra dose durin

## 2022-02-25 NOTE — Patient Instructions (Addendum)
I will be referring you to resume your lung cancer screening  ? ?Work on inhaler technique:  relax and gently blow all the way out then take a nice smooth full deep breath back in, triggering the inhaler at same time you start breathing in.  Hold for up to 5 seconds if you can. Blow out thru nose. Rinse and gargle with water when done.  If mouth or throat bother you at all,  try brushing teeth/gums/tongue with arm and hammer toothpaste/ make a slurry and gargle and spit out.  ? ? ?Remember how golfers take practice swings - use empty symbicort to warm up  ? ? ?Ok to try albuterol 15 min before an activity (on alternating days)  that you know would usually make you short of breath and see if it makes any difference and if makes none then don't take albuterol after activity unless you can't catch your breath as this means it's the resting that helps, not the albuterol. ? ?Depomerdrol 80 mg IM today  ? ? ?Please schedule a follow up visit in  6 months but call sooner if needed  ?    ? ? ?

## 2022-02-26 ENCOUNTER — Encounter: Payer: Self-pay | Admitting: Internal Medicine

## 2022-02-26 DIAGNOSIS — Z87891 Personal history of nicotine dependence: Secondary | ICD-10-CM | POA: Insufficient documentation

## 2022-02-26 NOTE — Assessment & Plan Note (Signed)
Quit smoking 2017  ?- PFT's  05/11/2020  FEV1 1.32 (33 % ) ratio 0.42  p 25 % improvement from saba p ? prior to study with DLCO  20.57 (69%) corrects to 3.43 (83%)  for alv volume and FV curve classic exp curvature  ?- 05/11/2020   > try brezri and off spiriva dpi  ? 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 ? Group D(newly called E)  in terms of symptom/risk and laba/lama/ICS  therefore appropriate rx at this point >>> breztri and approp saba ? ?Re SABA :  I spent extra time with pt today reviewing appropriate use of albuterol for prn use on exertion with the following points: ?1) saba is for relief of sob that does not improve by walking a slower pace or resting but rather if the pt does not improve after trying this first. ?2) If the pt is convinced, as many are, that saba helps recover from activity faster then it's easy to tell if this is the case by re-challenging : ie stop, take the inhaler, then p 5 minutes try the exact same activity (intensity of workload) that just caused the symptoms and see if they are substantially diminished or not after saba ?3) if there is an activity that reproducibly causes the symptoms, try the saba 15 min before the activity on alternate days  ? ?If in fact the saba really does help, then fine to continue to use it prn but advised may need to look closer at the maintenance regimen being used to achieve better control of airways disease with exertion.  ? ?  ?

## 2022-02-26 NOTE — Assessment & Plan Note (Signed)
Quit smoking 2017 ?- 02/25/2022 referred to resume  lung cancer screening/ eligible thru 2032 ?

## 2022-02-26 NOTE — Assessment & Plan Note (Signed)
Add Protonix 40 mg daily ac 08/31/2020  ?- Allergy profile 08/31/2020 >  Eos 0.2 /  IgE  1326 ?- Referred to allergy 03/13/2021 > [facility] 06/29/21  pos allergy to pollen, trees and dust and mold Rx Zyrtec (cetirizine) '10mg'$  tablet once daily, Singulair (montelukast) '10mg'$  daily, and Flonase (fluticasone) one daily ? ?Mild flare > rx depomedrol 80 mg IM / f/u with allergy prn  ? ? ?    ?  ? ?Each maintenance medication was reviewed in detail including emphasizing most importantly the difference between maintenance and prns and under what circumstances the prns are to be triggered using an action plan format where appropriate. ? ?Total time for H and P, chart review, counseling,  and generating customized AVS unique to this office visit / same day charting =  23 min  ?     ?

## 2022-02-26 NOTE — Assessment & Plan Note (Signed)
Body mass index is 41.56 kg/m?.  -  trending down now/ reinforced  ?Lab Results  ?Component Value Date  ? TSH 0.646 09/05/2020  ?  ? ? ?Contributing to doe and risk of GERD ?>>>   reviewed the need and the process to achieve and maintain neg calorie balance > defer f/u primary care including intermittently monitoring thyroid status    ?

## 2022-02-26 NOTE — Assessment & Plan Note (Signed)
Placed on 02 by Luan Pulling ? when ?- ONO RA 05/17/20  desat < 89% x 1 h 44 min  So rec continue noct 02 and titrate daytime to keep > 90%  ?-  08/31/2020   Walked RA  approx   200 ft  @ avg pace  stopped due to  Sob with sats 93% ?- 08/20/2021   Walked on RA x  3  lap(s) =  approx 525 ft  @  moderate pace with rollator, stopped due to sob x 2  with lowest 02 sats 96%  ? ?Reviewed: ?Make sure you check your oxygen saturation  AT  your highest level of activity (not after you stop)   to be sure it stays over 90% and adjust  02 flow upward to maintain this level if needed but remember to turn it back to previous settings when you stop (to conserve your supply).  ?

## 2022-03-05 ENCOUNTER — Other Ambulatory Visit: Payer: Self-pay | Admitting: Internal Medicine

## 2022-03-08 ENCOUNTER — Other Ambulatory Visit: Payer: Self-pay | Admitting: Internal Medicine

## 2022-03-08 DIAGNOSIS — J449 Chronic obstructive pulmonary disease, unspecified: Secondary | ICD-10-CM

## 2022-03-11 ENCOUNTER — Other Ambulatory Visit: Payer: Self-pay | Admitting: Family

## 2022-03-11 DIAGNOSIS — J441 Chronic obstructive pulmonary disease with (acute) exacerbation: Secondary | ICD-10-CM | POA: Diagnosis not present

## 2022-03-11 NOTE — Telephone Encounter (Signed)
Are you wanting to refill his epinephrine auto injector. He passed his shrimp challenge,but from your last office visit it does not look like he is eating shrimp

## 2022-03-12 NOTE — Telephone Encounter (Signed)
I am fine with that.  Maybe it makes him feel a little safer. ? ?Salvatore Marvel, MD ?[facility] [place] [facility] ? ?

## 2022-03-20 DIAGNOSIS — Z0001 Encounter for general adult medical examination with abnormal findings: Secondary | ICD-10-CM | POA: Diagnosis not present

## 2022-03-20 DIAGNOSIS — J449 Chronic obstructive pulmonary disease, unspecified: Secondary | ICD-10-CM | POA: Diagnosis not present

## 2022-03-20 DIAGNOSIS — E1165 Type 2 diabetes mellitus with hyperglycemia: Secondary | ICD-10-CM | POA: Diagnosis not present

## 2022-03-20 DIAGNOSIS — I700 Atherosclerosis of aorta: Secondary | ICD-10-CM | POA: Diagnosis not present

## 2022-03-20 DIAGNOSIS — I10 Essential (primary) hypertension: Secondary | ICD-10-CM | POA: Diagnosis not present

## 2022-03-20 DIAGNOSIS — Z1389 Encounter for screening for other disorder: Secondary | ICD-10-CM | POA: Diagnosis not present

## 2022-03-20 DIAGNOSIS — Z23 Encounter for immunization: Secondary | ICD-10-CM | POA: Diagnosis not present

## 2022-03-25 ENCOUNTER — Ambulatory Visit (HOSPITAL_COMMUNITY)
Admission: RE | Admit: 2022-03-25 | Discharge: 2022-03-25 | Disposition: A | Discharge status: Home / Self Care | Payer: Medicare Other | Source: Ambulatory Visit | Attending: Gerontology | Admitting: Gerontology

## 2022-03-25 ENCOUNTER — Other Ambulatory Visit (HOSPITAL_COMMUNITY)
Admission: RE | Admit: 2022-03-25 | Discharge: 2022-03-25 | Disposition: A | Discharge status: Home / Self Care | Payer: Medicare Other | Source: Ambulatory Visit | Attending: Internal Medicine | Admitting: Internal Medicine

## 2022-03-25 ENCOUNTER — Other Ambulatory Visit (HOSPITAL_COMMUNITY): Payer: Self-pay | Admitting: Gerontology

## 2022-03-25 DIAGNOSIS — E1165 Type 2 diabetes mellitus with hyperglycemia: Secondary | ICD-10-CM | POA: Insufficient documentation

## 2022-03-25 DIAGNOSIS — Z87891 Personal history of nicotine dependence: Secondary | ICD-10-CM | POA: Insufficient documentation

## 2022-03-25 DIAGNOSIS — R0989 Other specified symptoms and signs involving the circulatory and respiratory systems: Secondary | ICD-10-CM

## 2022-03-25 DIAGNOSIS — R059 Cough, unspecified: Secondary | ICD-10-CM | POA: Diagnosis not present

## 2022-03-25 DIAGNOSIS — R918 Other nonspecific abnormal finding of lung field: Secondary | ICD-10-CM | POA: Diagnosis not present

## 2022-03-25 DIAGNOSIS — Z0001 Encounter for general adult medical examination with abnormal findings: Secondary | ICD-10-CM | POA: Insufficient documentation

## 2022-03-25 DIAGNOSIS — R0602 Shortness of breath: Secondary | ICD-10-CM | POA: Insufficient documentation

## 2022-03-25 DIAGNOSIS — J449 Chronic obstructive pulmonary disease, unspecified: Secondary | ICD-10-CM | POA: Diagnosis not present

## 2022-03-25 DIAGNOSIS — Z1159 Encounter for screening for other viral diseases: Secondary | ICD-10-CM | POA: Diagnosis not present

## 2022-03-25 LAB — CBC WITH DIFFERENTIAL/PLATELET
Abs Immature Granulocytes: 0.04 10*3/uL (ref 0.00–0.07)
Basophils Absolute: 0.1 10*3/uL (ref 0.0–0.1)
Basophils Relative: 1 %
Eosinophils Absolute: 0.1 10*3/uL (ref 0.0–0.5)
Eosinophils Relative: 1 %
HCT: 41.3 % (ref 39.0–52.0)
Hemoglobin: 13.7 g/dL (ref 13.0–17.0)
Immature Granulocytes: 0 %
Lymphocytes Relative: 17 %
Lymphs Abs: 1.8 10*3/uL (ref 0.7–4.0)
MCH: 31.1 pg (ref 26.0–34.0)
MCHC: 33.2 g/dL (ref 30.0–36.0)
MCV: 93.9 fL (ref 80.0–100.0)
Monocytes Absolute: 0.8 10*3/uL (ref 0.1–1.0)
Monocytes Relative: 7 %
Neutro Abs: 7.9 10*3/uL — ABNORMAL HIGH (ref 1.7–7.7)
Neutrophils Relative %: 74 %
Platelets: 226 10*3/uL (ref 150–400)
RBC: 4.4 MIL/uL (ref 4.22–5.81)
RDW: 12.3 % (ref 11.5–15.5)
WBC: 10.7 10*3/uL — ABNORMAL HIGH (ref 4.0–10.5)
nRBC: 0 % (ref 0.0–0.2)

## 2022-03-25 LAB — LIPID PANEL
Cholesterol: 111 mg/dL (ref 0–200)
HDL: 35 mg/dL — ABNORMAL LOW (ref >40)
LDL Cholesterol: 62 mg/dL (ref 0–99)
Total CHOL/HDL Ratio: 3.2 RATIO
Triglycerides: 68 mg/dL (ref <150)
VLDL: 14 mg/dL (ref 0–40)

## 2022-03-25 LAB — HEPATIC FUNCTION PANEL
ALT: 18 U/L (ref 0–44)
AST: 15 U/L (ref 15–41)
Albumin: 4 g/dL (ref 3.5–5.0)
Alkaline Phosphatase: 73 U/L (ref 38–126)
Bilirubin, Direct: 0.1 mg/dL (ref 0.0–0.2)
Indirect Bilirubin: 0.6 mg/dL (ref 0.3–0.9)
Total Bilirubin: 0.7 mg/dL (ref 0.3–1.2)
Total Protein: 7.3 g/dL (ref 6.5–8.1)

## 2022-03-25 LAB — BASIC METABOLIC PANEL
Anion gap: 5 (ref 5–15)
BUN: 12 mg/dL (ref 8–23)
CO2: 26 mmol/L (ref 22–32)
Calcium: 9.4 mg/dL (ref 8.9–10.3)
Chloride: 108 mmol/L (ref 98–111)
Creatinine, Ser: 0.86 mg/dL (ref 0.61–1.24)
GFR, Estimated: >60 mL/min (ref >60)
Glucose, Bld: 106 mg/dL — ABNORMAL HIGH (ref 70–99)
Potassium: 4 mmol/L (ref 3.5–5.1)
Sodium: 139 mmol/L (ref 135–145)

## 2022-03-25 LAB — HEMOGLOBIN A1C
Hgb A1c MFr Bld: 6 % — ABNORMAL HIGH (ref 4.8–5.6)
Mean Plasma Glucose: 125.5 mg/dL

## 2022-03-25 LAB — HEPATITIS C ANTIBODY: HCV Ab: NONREACTIVE

## 2022-03-26 LAB — MICROALBUMIN, URINE: Microalb, Ur: <3 ug/mL — ABNORMAL HIGH

## 2022-04-09 ENCOUNTER — Other Ambulatory Visit: Payer: Self-pay

## 2022-04-09 DIAGNOSIS — Z87891 Personal history of nicotine dependence: Secondary | ICD-10-CM

## 2022-04-09 DIAGNOSIS — Z122 Encounter for screening for malignant neoplasm of respiratory organs: Secondary | ICD-10-CM

## 2022-04-11 DIAGNOSIS — R531 Weakness: Secondary | ICD-10-CM | POA: Diagnosis not present

## 2022-04-11 DIAGNOSIS — J441 Chronic obstructive pulmonary disease with (acute) exacerbation: Secondary | ICD-10-CM | POA: Diagnosis not present

## 2022-04-12 ENCOUNTER — Ambulatory Visit (INDEPENDENT_AMBULATORY_CARE_PROVIDER_SITE_OTHER): Payer: Medicare Other | Admitting: Internal Medicine

## 2022-04-12 ENCOUNTER — Encounter: Payer: Self-pay | Admitting: Internal Medicine

## 2022-04-12 VITALS — BP 120/62 | HR 83 | Temp 97.3°F | Ht 73.0 in | Wt 289.4 lb

## 2022-04-12 DIAGNOSIS — K921 Melena: Secondary | ICD-10-CM

## 2022-04-12 NOTE — Progress Notes (Signed)
Primary Care Physician:  Carrolyn Meiers, MD Primary Gastroenterologist:  Dr. Gala Romney  Pre-Procedure History & Physical: HPI:  Stephen Warren is a 66 y.o. male here for for the observation he may have passed a small amount of blood per rectum a couple of bowel movements over a couple of days.  This was about a month ago.  Blood work at [facility] from 2 weeks ago demonstrated a normal H&H.  I note that he also had normal LFTs but hepatitis C antibody was done which came back negative.  Patient denies bowel dysfunction otherwise.  He has normal regular bowel movements denies straining or spending prolonged time on the toilet.  History of a large adenoma removed from his colon 2016 follow-up 2020 right-sided diverticulosis only; slated for 5-year examination.  History of GERD well-controlled on pantoprazole 40 mg daily.  No dysphagia, nausea or vomiting.  Colonoscopy from 2020 reviewed.  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   Hemorrhoids    History of kidney stones    Insomnia    Knee pain    Panic attacks    Recurrent upper respiratory infection (URI)     Past Surgical History:  Procedure Laterality Date   BIOPSY  10/30/2015   Procedure: BIOPSY;  Surgeon: Daneil Dolin, MD;  [facility];  Service: Endoscopy;;  ileocecal valve ulcer   BIOPSY  07/04/2019   Procedure: BIOPSY;  Surgeon: Danie Binder, MD;  [facility];  Service: Endoscopy;;  gastric   COLONOSCOPY WITH PROPOFOL N/A 10/30/2015   Procedure: COLONOSCOPY WITH PROPOFOL;  Surgeon: Daneil Dolin, MD;  [facility];  Service: Endoscopy;  Laterality: N/A;  1000    COLONOSCOPY WITH PROPOFOL N/A 09/16/2019   Procedure: COLONOSCOPY WITH  PROPOFOL;  Surgeon: Daneil Dolin, MD;  [facility];  Service: Endoscopy;  Laterality: N/A;  8:15am   ESOPHAGOGASTRODUODENOSCOPY (EGD) WITH PROPOFOL N/A 07/04/2019   Procedure: ESOPHAGOGASTRODUODENOSCOPY (EGD) WITH PROPOFOL;  Surgeon: Danie Binder, MD;  [facility];  Service: Endoscopy;  Laterality: N/A;   NASAL SEPTOPLASTY W/ TURBINOPLASTY  07/01/2018   NASAL SEPTOPLASTY W/ TURBINOPLASTY Bilateral 07/01/2018   Procedure: NASAL SEPTOPLASTY WITH BILATERAL TURBINATE REDUCTION;  Surgeon: Leta Baptist, MD;  Location: [place];  Service: ENT;  Laterality: Bilateral;   None to date  10/16/2015   POLYPECTOMY  10/30/2015   Procedure: POLYPECTOMY;  Surgeon: Daneil Dolin, MD;  [facility];  Service: Endoscopy;;  sigmoid polypectomy  x3    Prior to Admission medications   Medication Sig Start Date End Date Taking? Authorizing Provider  albuterol (PROVENTIL) (2.5 MG/3ML) 0.083% nebulizer solution Take 2.5 mg by nebulization 4 (four) times daily.   Yes [provider]  atorvastatin (LIPITOR) 20 MG tablet  03/21/21  Yes [provider]  azelastine (ASTELIN) 0.1 % nasal spray Place 1 spray into both nostrils 2 (two) times daily. Use in each nostril as directed 01/10/22 04/12/22 Yes Valentina Shaggy, MD  baclofen (LIORESAL) 20 MG tablet Take 20 mg by mouth daily.  06/10/18  Yes [provider]  BREZTRI AEROSPHERE 160-9-4.8 MCG/ACT AERO INHALE TWO  PUFFS BY MOUTH TWICE DAILY 03/06/22  Yes Tanda Rockers, MD  cetirizine (ZYRTEC) 10 MG tablet Take 1 tablet (10 mg total) by mouth 2 (two) times daily as needed for allergies (Can take an extra dose during flare ups.). 01/10/22 04/12/22 Yes Valentina Shaggy, MD  citalopram (CELEXA) 20 MG tablet Take 20 mg by mouth daily.   Yes [provider]  EPINEPHRINE 0.3 mg/0.3 mL IJ SOAJ injection Inject one dose intramuscularly for allergic reaction. May repeat one dose if needed after 5-15 minutes. Proceed  to the ER 03/12/22  Yes Valentina Shaggy, MD  furosemide (LASIX) 20 MG tablet Take 20 mg by mouth daily as needed.   Yes [provider]  metFORMIN (GLUCOPHAGE) 500 MG tablet Take 500 mg by mouth 2 (two) times daily with a meal.   Yes [provider]  montelukast (SINGULAIR) 10 MG tablet Take 1 tablet (10 mg total) by mouth at bedtime. 01/10/22 04/12/22 Yes Valentina Shaggy, MD  pantoprazole (PROTONIX) 40 MG tablet TAKE 1 TABLET BY MOUTH DAILY 30-60 MINUTES BEFORE THE first meal of THE DAY 03/08/22  Yes Tanda Rockers, MD  PROAIR HFA 108 [phone] Base) MCG/ACT inhaler Inhale 1-2 puffs into the lungs 4 (four) times daily as needed for wheezing or shortness of breath.  06/10/18  Yes [provider]  traZODone (DESYREL) 100 MG tablet Take two (2) tablets by mouth at bedtime 12/20/20  Yes [provider]    Allergies as of 04/12/2022 - Review Complete 04/12/2022  Allergen Reaction Noted   Other Anaphylaxis 10/25/2015   Iodine  10/16/2015   Penicillin g  05/11/2020   Shellfish allergy Swelling 10/16/2015    Family History  Problem Relation Age of Onset   Asthma Mother    Allergic rhinitis Mother    Allergic rhinitis Brother    Asthma Maternal Grandmother    Colon cancer Neg Hx    Colon polyps Neg Hx    Eczema Neg Hx    Urticaria Neg Hx     Social History   Socioeconomic History   Marital status: Single    Spouse name: Not on file   Number of children: Not on file   Years of education: Not on file   Highest education level: Not on file  Occupational History   Not on file  Tobacco Use   Smoking status: Former    Packs/day: 1.00    Years: 45.00    Pack years: 45.00    Types: Cigarettes    Quit date: 12/25/2017    Years since quitting: 4.2   Smokeless tobacco: Never  Vaping Use   Vaping Use: Never used  Substance and Sexual Activity   Alcohol use: Not Currently    Comment: About 2 times a year; 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 SPENDS FREE TIME: COMPUTER GAMES.   Social Determinants of Health   Financial Resource Strain: Not on file  Food Insecurity: Not on file  Transportation Needs: Not on file  Physical Activity: Not on file  Stress: Not on file  Social Connections: Not on file  Intimate Partner Violence: Not on file    Review of Systems: See HPI, otherwise negative ROS  Physical Exam: BP 120/62 (BP Location: Left  Arm, Patient Position: Sitting, Cuff Size: Large)   Pulse 83   Temp (!) 97.3 F (36.3 C) (Oral)   Ht '6\' 1"'$  (1.854 m)   Wt 289 lb 6.4 oz (131.3 kg)   SpO2 92%   BMI 38.18 kg/m  General:   Alert,  Well-developed, well-nourished, pleasant and cooperative in NAD Neck:  Supple; no masses or thyromegaly. No significant cervical adenopathy. Lungs:  Clear throughout to auscultation.   No wheezes, crackles, or rhonchi. No acute distress. Heart:  Regular rate and rhythm; no murmurs, clicks, rubs,  or gallops. Abdomen: Non-distended, normal bowel sounds.  Soft and nontender without appreciable mass or hepatosplenomegaly.  Pulses:  Normal pulses noted. Extremities:  Without clubbing or edema. Rectal: Good sphincter tone.  No mass in rectal vault.  Scant brown stools Hemoccult negative  Impression/Plan: Pleasant 66 year old gentleman multiple comorbidities presents with a concern he had some self-limiting paper hematochezia recently.  No bowel symptoms now.  Apparently symptoms self-limiting.  He may or may not have passed blood.  Certainly, Hemoccult negative today.  Recent CBC reassuring.  High-quality colonoscopy about 2 and half years ago.  I suspect benign anorectal bleeding if at all.  Recommendations:   As reviewed, your recent complete blood count was completely normal.  You are  not anemic.  I recommend we keep you on schedule for repeat colonoscopy in 2025.  If you see any blood in your stool in the future between now and 2025 we will go ahead and move up the timing of your colonoscopy.  Please call me if you have any problems whatsoever.     Notice: This dictation was prepared with Dragon dictation along with smaller phrase technology. Any transcriptional errors that result from this process are unintentional and may not be corrected upon review.

## 2022-04-12 NOTE — Patient Instructions (Signed)
It was good seeing you again today!  Your rectal exam was normal today.  There was no blood in your stool.  As reviewed, your recent complete blood count was completely normal.  You are not anemic.  I recommend we keep you on schedule for repeat colonoscopy in 2025.  If you see any blood in your stool in the future between now and 2025 we will go ahead and move up the timing of your colonoscopy.  Please call me if you have any problems whatsoever.

## 2022-04-20 DIAGNOSIS — J449 Chronic obstructive pulmonary disease, unspecified: Secondary | ICD-10-CM | POA: Diagnosis not present

## 2022-04-20 DIAGNOSIS — E1165 Type 2 diabetes mellitus with hyperglycemia: Secondary | ICD-10-CM | POA: Diagnosis not present

## 2022-04-23 ENCOUNTER — Ambulatory Visit (HOSPITAL_COMMUNITY)
Admission: RE | Admit: 2022-04-23 | Discharge: 2022-04-23 | Disposition: A | Discharge status: Home / Self Care | Payer: Medicare Other | Source: Ambulatory Visit | Attending: Gerontology | Admitting: Gerontology

## 2022-04-23 ENCOUNTER — Other Ambulatory Visit (HOSPITAL_COMMUNITY): Payer: Self-pay | Admitting: Psychiatry

## 2022-04-23 ENCOUNTER — Other Ambulatory Visit (HOSPITAL_COMMUNITY): Payer: Self-pay | Admitting: Gerontology

## 2022-04-23 DIAGNOSIS — J449 Chronic obstructive pulmonary disease, unspecified: Secondary | ICD-10-CM

## 2022-04-23 DIAGNOSIS — J189 Pneumonia, unspecified organism: Secondary | ICD-10-CM

## 2022-05-08 ENCOUNTER — Ambulatory Visit (INDEPENDENT_AMBULATORY_CARE_PROVIDER_SITE_OTHER): Payer: Medicare Other | Admitting: Acute Care

## 2022-05-08 ENCOUNTER — Telehealth: Payer: Self-pay | Admitting: Internal Medicine

## 2022-05-08 ENCOUNTER — Encounter: Payer: Self-pay | Admitting: Acute Care

## 2022-05-08 DIAGNOSIS — Z87891 Personal history of nicotine dependence: Secondary | ICD-10-CM | POA: Diagnosis not present

## 2022-05-08 NOTE — Progress Notes (Signed)
Virtual Visit via Telephone Note  I connected with Radonna Ricker on 04/16/22 at  3:30 PM EDT by telephone and verified that I am speaking with the correct person using two identifiers.  Location: Patient: At home Provider: [address], [place], [place], [address]    I discussed the limitations, risks, security and privacy concerns of performing an evaluation and management service by telephone and the availability of in person appointments. I also discussed with the patient that there may be a patient responsible charge related to this service. The patient expressed understanding and agreed to proceed.   Shared Decision Making Visit Lung Cancer Screening Program [phone])   Eligibility: Age 66 y.o. Pack Years Smoking History Calculation 98 pack year smoking history (# packs/per year x # years smoked) Recent History of coughing up blood  no Unexplained weight loss? no ( >Than 15 pounds within the last 6 months ) Prior History Lung / other cancer no (Diagnosis within the last 5 years already requiring surveillance chest CT Scans). Smoking Status Former Smoker Former Smokers: Years since quit: 4 years  Quit Date: 12/25/2017  Visit Components: Discussion included one or more decision making aids. yes Discussion included risk/benefits of screening. yes Discussion included potential follow up diagnostic testing for abnormal scans. yes Discussion included meaning and risk of over diagnosis. yes Discussion included meaning and risk of False Positives. yes Discussion included meaning of total radiation exposure. yes  Counseling Included: Importance of adherence to annual lung cancer LDCT screening. yes Impact of comorbidities on ability to participate in the program. yes Ability and willingness to under diagnostic treatment. yes  Smoking Cessation Counseling: Current Smokers:  Discussed importance of smoking cessation. yes Information about tobacco cessation classes  and interventions provided to patient. yes Patient provided with "ticket" for LDCT Scan. yes Symptomatic Patient. no  Counseling NA Diagnosis Code: Tobacco Use Z72.0 Asymptomatic Patient yes  Counseling (Intermediate counseling: > three minutes counseling) [id number] Former Smokers:  Discussed the importance of maintaining cigarette abstinence. yes Diagnosis Code: Personal History of Nicotine Dependence. D98.338 Information about tobacco cessation classes and interventions provided to patient. Yes Patient provided with "ticket" for LDCT Scan. yes Written Order for Lung Cancer Screening with LDCT placed in Epic. Yes (CT Chest Lung Cancer Screening Low Dose W/O CM) [id number] Z12.2-Screening of respiratory organs Z87.891-Personal history of nicotine dependence  I spent 25 minutes of face to face time/virtual visit time  with  Mr. Krichbaum discussing the risks and benefits of lung cancer screening. We took the time to pause the power point at intervals to allow for questions to be asked and answered to ensure understanding. We discussed that he had taken the single most powerful action possible to decrease his risk of developing lung cancer when he quit smoking. I counseled him to remain smoke free, and to contact me if he ever had the desire to smoke again so that I can provide resources and tools to help support the effort to remain smoke free. We discussed the time and location of the scan, and that either  Doroteo Glassman RN, Joella Prince, RN or I  or I will call / send a letter with the results within  24-72 hours of receiving them. He has the office contact information in the event he needs to speak with me,  he verbalized understanding of all of the above and had no further questions upon leaving the office.     I explained to the patient that there has been  a high incidence of coronary artery disease noted on these exams. I explained that this is a non-gated exam therefore degree or severity cannot be  determined. This patient is on statin therapy. I have asked the patient to follow-up with their PCP regarding any incidental finding of coronary artery disease and management with diet or medication as they feel is clinically indicated. The patient verbalized understanding of the above and had no further questions.     Magdalen Spatz, NP  05/08/2022

## 2022-05-08 NOTE — Telephone Encounter (Signed)
Sent a message to [facility] team to find out when qualifying walk is due since patient has an appt in October. Will follow up after hearing from them

## 2022-05-08 NOTE — Patient Instructions (Signed)
Thank you for participating in the [facility]. It was our pleasure to meet you today. We will call you with the results of your scan within the next few days. Your scan will be assigned a Lung RADS category score by the physicians reading the scans.  This Lung RADS score determines follow up scanning.  See below for description of categories, and follow up screening recommendations. We will be in touch to schedule your follow up screening annually or based on recommendations of our providers. We will fax a copy of your scan results to your Primary Care Physician, or the physician who referred you to the program, to ensure they have the results. Please call the office if you have any questions or concerns regarding your scanning experience or results.  Our office number is [phone]. Please speak with Denise Phelps, RN. , or  Denise Buckner RN, They are  our Lung Cancer Screening RN.'s If They are unavailable when you call, Please leave a message on the voice mail. We will return your call at our earliest convenience.This voice mail is monitored several times a day.  Remember, if your scan is normal, we will scan you annually as long as you continue to meet the criteria for the program. (Age 55-77, Current smoker or smoker who has quit within the last 15 years). If you are a smoker, remember, quitting is the single most powerful action that you can take to decrease your risk of lung cancer and other pulmonary, breathing related problems. We know quitting is hard, and we are here to help.  Please let us know if there is anything we can do to help you meet your goal of quitting. If you are a former smoker, congratulations. We are proud of you! Remain smoke free! Remember you can refer friends or family members through the number above.  We will screen them to make sure they meet criteria for the program. Thank you for helping us take better care of you by  participating in Lung Screening.  You can receive free nicotine replacement therapy ( patches, gum or mints) by calling 1-800-QUIT NOW. Please call so we can get you on the path to becoming  a non-smoker. I know it is hard, but you can do this!  Lung RADS Categories:  Lung RADS 1: no nodules or definitely non-concerning nodules.  Recommendation is for a repeat annual scan in 12 months.  Lung RADS 2:  nodules that are non-concerning in appearance and behavior with a very low likelihood of becoming an active cancer. Recommendation is for a repeat annual scan in 12 months.  Lung RADS 3: nodules that are probably non-concerning , includes nodules with a low likelihood of becoming an active cancer.  Recommendation is for a 6-month repeat screening scan. Often noted after an upper respiratory illness. We will be in touch to make sure you have no questions, and to schedule your 6-month scan.  Lung RADS 4 A: nodules with concerning findings, recommendation is most often for a follow up scan in 3 months or additional testing based on our provider's assessment of the scan. We will be in touch to make sure you have no questions and to schedule the recommended 3 month follow up scan.  Lung RADS 4 B:  indicates findings that are concerning. We will be in touch with you to schedule additional diagnostic testing based on our provider's  assessment of the scan.  Other options for assistance in smoking cessation (   As covered by your insurance benefits)  Hypnosis for smoking cessation  [employer] [phone]  Acupuncture for smoking cessation  [facility] [phone]   

## 2022-05-11 DIAGNOSIS — J441 Chronic obstructive pulmonary disease with (acute) exacerbation: Secondary | ICD-10-CM | POA: Diagnosis not present

## 2022-05-12 DIAGNOSIS — R531 Weakness: Secondary | ICD-10-CM | POA: Diagnosis not present

## 2022-05-13 NOTE — Telephone Encounter (Signed)
No update as of now

## 2022-05-13 NOTE — Telephone Encounter (Signed)
Please advise if you have any update on this.

## 2022-05-15 NOTE — Telephone Encounter (Signed)
Sent a message to [facility] staff asking for an update.

## 2022-05-17 ENCOUNTER — Ambulatory Visit (HOSPITAL_COMMUNITY)
Admission: RE | Admit: 2022-05-17 | Discharge: 2022-05-17 | Disposition: A | Discharge status: Home / Self Care | Payer: Medicare Other | Source: Ambulatory Visit | Attending: Internal Medicine | Admitting: Internal Medicine

## 2022-05-17 DIAGNOSIS — Z122 Encounter for screening for malignant neoplasm of respiratory organs: Secondary | ICD-10-CM | POA: Insufficient documentation

## 2022-05-17 DIAGNOSIS — Z87891 Personal history of nicotine dependence: Secondary | ICD-10-CM | POA: Diagnosis not present

## 2022-05-22 ENCOUNTER — Other Ambulatory Visit: Payer: Self-pay

## 2022-05-22 DIAGNOSIS — Z87891 Personal history of nicotine dependence: Secondary | ICD-10-CM

## 2022-05-22 DIAGNOSIS — R911 Solitary pulmonary nodule: Secondary | ICD-10-CM

## 2022-05-22 NOTE — Telephone Encounter (Signed)
I have called the patient with the results of his low dose CT Chest.I explained that his scan was read as a Lung  RADS 3, nodules that are probably benign findings, short term follow up suggested: includes nodules with a low likelihood of becoming a clinically active cancer. Radiology recommends a 6 month repeat LDCT follow up. He states he has been sick. 03/2021 he had walking pneumonia. He was treated with antibiotics at that time. He has developed a cough, which is new. He has an appointment with his PCP 05/28/2022 to have this evaluated. Pt. Is in agreement with follow up low dose CT Chest in 6 months. Langley Gauss, please fax results to PCP and order 6 month low dose follow up. Thanks so much

## 2022-05-22 NOTE — Telephone Encounter (Signed)
Results/plan faxed to PCP and new order placed for 6 months follow up nodule CT Chest

## 2022-05-28 DIAGNOSIS — J441 Chronic obstructive pulmonary disease with (acute) exacerbation: Secondary | ICD-10-CM | POA: Diagnosis not present

## 2022-05-28 DIAGNOSIS — E1165 Type 2 diabetes mellitus with hyperglycemia: Secondary | ICD-10-CM | POA: Diagnosis not present

## 2022-05-28 DIAGNOSIS — M549 Dorsalgia, unspecified: Secondary | ICD-10-CM | POA: Diagnosis not present

## 2022-06-02 ENCOUNTER — Emergency Department (HOSPITAL_COMMUNITY): Payer: Medicare Other

## 2022-06-02 ENCOUNTER — Encounter (HOSPITAL_COMMUNITY): Payer: Self-pay

## 2022-06-02 ENCOUNTER — Emergency Department (HOSPITAL_COMMUNITY)
Admission: EM | Admit: 2022-06-02 | Discharge: 2022-06-02 | Disposition: A | Discharge status: Home / Self Care | Payer: Medicare Other | Attending: Emergency Medicine | Admitting: Emergency Medicine

## 2022-06-02 DIAGNOSIS — R9431 Abnormal electrocardiogram [ECG] [EKG]: Secondary | ICD-10-CM | POA: Diagnosis not present

## 2022-06-02 DIAGNOSIS — I700 Atherosclerosis of aorta: Secondary | ICD-10-CM | POA: Diagnosis not present

## 2022-06-02 DIAGNOSIS — R109 Unspecified abdominal pain: Secondary | ICD-10-CM | POA: Diagnosis not present

## 2022-06-02 DIAGNOSIS — M5136 Other intervertebral disc degeneration, lumbar region: Secondary | ICD-10-CM | POA: Insufficient documentation

## 2022-06-02 DIAGNOSIS — M545 Low back pain, unspecified: Secondary | ICD-10-CM | POA: Diagnosis not present

## 2022-06-02 LAB — BASIC METABOLIC PANEL
Anion gap: 7 (ref 5–15)
BUN: 15 mg/dL (ref 8–23)
CO2: 28 mmol/L (ref 22–32)
Calcium: 8.8 mg/dL — ABNORMAL LOW (ref 8.9–10.3)
Chloride: 99 mmol/L (ref 98–111)
Creatinine, Ser: 0.92 mg/dL (ref 0.61–1.24)
GFR, Estimated: >60 mL/min (ref >60)
Glucose, Bld: 127 mg/dL — ABNORMAL HIGH (ref 70–99)
Potassium: 4.1 mmol/L (ref 3.5–5.1)
Sodium: 134 mmol/L — ABNORMAL LOW (ref 135–145)

## 2022-06-02 LAB — CBC
HCT: 40.4 % (ref 39.0–52.0)
Hemoglobin: 13.4 g/dL (ref 13.0–17.0)
MCH: 31.2 pg (ref 26.0–34.0)
MCHC: 33.2 g/dL (ref 30.0–36.0)
MCV: 94.2 fL (ref 80.0–100.0)
Platelets: 219 10*3/uL (ref 150–400)
RBC: 4.29 MIL/uL (ref 4.22–5.81)
RDW: 12.5 % (ref 11.5–15.5)
WBC: 13.8 10*3/uL — ABNORMAL HIGH (ref 4.0–10.5)
nRBC: 0 % (ref 0.0–0.2)

## 2022-06-02 LAB — URINALYSIS, ROUTINE W REFLEX MICROSCOPIC
Bilirubin Urine: NEGATIVE
Glucose, UA: NEGATIVE mg/dL
Hgb urine dipstick: NEGATIVE
Ketones, ur: NEGATIVE mg/dL
Leukocytes,Ua: NEGATIVE
Nitrite: NEGATIVE
Protein, ur: NEGATIVE mg/dL
Specific Gravity, Urine: 1.026 (ref 1.005–1.030)
pH: 6 (ref 5.0–8.0)

## 2022-06-02 MED ORDER — ONDANSETRON HCL 4 MG/2ML IJ SOLN
4.0000 mg | Freq: Once | INTRAMUSCULAR | Status: AC
  Administered 2022-06-02: 4 mg via INTRAVENOUS
  Filled 2022-06-02: qty 2

## 2022-06-02 MED ORDER — CELECOXIB 200 MG PO CAPS: 200.0000 mg | ORAL_CAPSULE | Freq: Two times a day (BID) | ORAL | 0 refills | Status: DC

## 2022-06-02 MED ORDER — FENTANYL CITRATE PF 50 MCG/ML IJ SOSY
50.0000 ug | PREFILLED_SYRINGE | Freq: Once | INTRAMUSCULAR | Status: AC
  Administered 2022-06-02: 50 ug via INTRAVENOUS
  Filled 2022-06-02: qty 1

## 2022-06-02 NOTE — ED Notes (Signed)
Patient states bilateral flank pain 4/10 and has also been having bilateral joint pain in legs. States this has been happening for 3 weeks. No burning or urgency.

## 2022-06-02 NOTE — Discharge Instructions (Signed)
SEEK IMMEDIATE MEDICAL ATTENTION IF: New numbness, tingling, weakness, or problem with the use of your arms or legs.  Severe back pain not relieved with medications.  Change in bowel or bladder control.  Increasing pain in any areas of the body (such as chest or abdominal pain).  Shortness of breath, dizziness or fainting.  Nausea (feeling sick to your stomach), vomiting, fever, or sweats.  

## 2022-06-02 NOTE — ED Notes (Signed)
Pt returned from CT °

## 2022-06-02 NOTE — ED Triage Notes (Signed)
Pt arrives today c/o right flank pain. Pt is concerned for his kidneys. Pt states pain has gotten worse over the last 2 weeks. Pt endorses frequent urination. Pt states he has had the "runs" and/or soft stools

## 2022-06-02 NOTE — ED Provider Notes (Signed)
[facility] EMERGENCY DEPARTMENT Provider Note   CSN: [id number] Arrival date & time: 06/02/22  1238     History  Chief Complaint  Patient presents with   Flank Pain    Stephen Warren is a 66 y.o. male.   Flank Pain The current episode started more than 1 week ago. The problem occurs constantly. The problem has been gradually worsening. Pertinent negatives include no chest pain, no abdominal pain, no headaches and no shortness of breath. The symptoms are aggravated by walking and standing (movement, change in position). Relieved by: rest. Treatments tried: Back-Aid, Baclofen- no relief. The treatment provided no relief.       Home Medications Prior to Admission medications   Medication Sig Start Date End Date Taking? Authorizing Provider  albuterol (PROVENTIL) (2.5 MG/3ML) 0.083% nebulizer solution Take 2.5 mg by nebulization 4 (four) times daily.    [provider]  atorvastatin (LIPITOR) 20 MG tablet  03/21/21   [provider]  azelastine (ASTELIN) 0.1 % nasal spray Place 1 spray into both nostrils 2 (two) times daily. Use in each nostril as directed 01/10/22 04/12/22  Valentina Shaggy, MD  baclofen (LIORESAL) 20 MG tablet Take 20 mg by mouth daily.  06/10/18   [provider]  BREZTRI AEROSPHERE 160-9-4.8 MCG/ACT AERO INHALE TWO PUFFS BY MOUTH TWICE DAILY 03/06/22   Tanda Rockers, MD  cetirizine (ZYRTEC) 10 MG tablet Take 1 tablet (10 mg total) by mouth 2 (two) times daily as needed for allergies (Can take an extra dose during flare ups.). 01/10/22 04/12/22  Valentina Shaggy, MD  citalopram (CELEXA) 20 MG tablet Take 20 mg by mouth daily.    [provider]  EPINEPHRINE 0.3 mg/0.3 mL IJ SOAJ injection Inject one dose intramuscularly for allergic reaction. May repeat one dose if needed after 5-15 minutes. Proceed to the ER 03/12/22   Valentina Shaggy, MD  furosemide (LASIX) 20 MG tablet Take 20 mg by mouth daily as needed.    [provider]  metFORMIN (GLUCOPHAGE) 500 MG tablet Take 500 mg by mouth 2 (two) times daily with a meal.    [provider]  montelukast (SINGULAIR) 10 MG tablet Take 1 tablet (10 mg total) by mouth at bedtime. 01/10/22 04/12/22  Valentina Shaggy, MD  pantoprazole (PROTONIX) 40 MG tablet TAKE 1 TABLET BY MOUTH DAILY 30-60 MINUTES BEFORE THE first meal of THE DAY 03/08/22   Tanda Rockers, MD  PROAIR HFA 108 [phone] Base) MCG/ACT inhaler Inhale 1-2 puffs into the lungs 4 (four) times daily as needed for wheezing or shortness of breath.  06/10/18   [provider]  traZODone (DESYREL) 100 MG tablet Take two (2) tablets by mouth at bedtime 12/20/20   [provider]      Allergies    Other, Iodine, Penicillin g, and Shellfish allergy    Review of Systems   Review of Systems  Constitutional:  Negative for chills and fever.  Respiratory:  Negative for shortness of breath.   Cardiovascular:  Negative for chest pain.  Gastrointestinal:  Negative for abdominal pain.  Genitourinary:  Positive for flank pain. Negative for difficulty urinating, dysuria and hematuria.  Musculoskeletal:  Positive for back pain. Negative for gait problem, joint swelling and myalgias.  Neurological:  Positive for tremors. Negative for weakness, light-headedness and headaches.    Physical Exam Updated Vital Signs BP 140/90   Pulse 66   Temp 98.4 F (36.9 C) (Oral)   Resp 19  Ht '6\' 1"'$  (1.854 m)   Wt 131.5 kg   SpO2 97%   BMI 38.26 kg/m  Physical Exam Vitals and nursing note reviewed.  Constitutional:      General: He is not in acute distress.    Appearance: He is well-developed. He is obese. He is not diaphoretic.  HENT:     Head: Normocephalic and atraumatic.  Eyes:     General: No scleral icterus.    Conjunctiva/sclera: Conjunctivae normal.  Cardiovascular:     Rate and Rhythm: Normal rate and regular rhythm.     Heart sounds: Normal heart sounds.  Pulmonary:     Effort:  Pulmonary effort is normal. No respiratory distress.     Breath sounds: Normal breath sounds.  Abdominal:     Palpations: Abdomen is soft.     Tenderness: There is no abdominal tenderness. There is no right CVA tenderness or left CVA tenderness.  Musculoskeletal:     Cervical back: Normal, normal range of motion and neck supple.     Thoracic back: Normal.     Lumbar back: Spasms and tenderness present. No bony tenderness. Decreased range of motion. Negative right straight leg raise test and negative left straight leg raise test.       Back:  Skin:    General: Skin is warm and dry.  Neurological:     Mental Status: He is alert.  Psychiatric:        Behavior: Behavior normal.     ED Results / Procedures / Treatments   Labs (all labs ordered are listed, but only abnormal results are displayed) Labs Reviewed  BASIC METABOLIC PANEL  CBC  URINALYSIS, ROUTINE W REFLEX MICROSCOPIC    EKG EKG Interpretation  Date/Time:  Sunday June 02 2022 12:52:46 EDT Ventricular Rate:  76 PR Interval:  149 QRS Duration: 81 QT Interval:  421 QTC Calculation: 474 R Axis:   73 Text Interpretation: Sinus rhythm Minimal ST depression, diffuse leads ST depressions similar to Aug 2020 rate has normalized Confirmed by Sherwood Gambler [phone]) on 06/02/2022 12:56:31 PM  Radiology No results found.  Procedures Procedures    Medications Ordered in ED Medications  fentaNYL (SUBLIMAZE) injection 50 mcg (has no administration in time range)  ondansetron (ZOFRAN) injection 4 mg (has no administration in time range)    ED Course/ Medical Decision Making/ A&P Clinical Course as of 06/02/22 1441  Sun Jun 02, 2022  1343 Patient presents with 1 week of flank pain.  Low suspicion for kidney issues, but patient is tremorous. Suspect msk pain based on exam. No red flags sxs. Will obtain imaging. Labs, to evaluate electrolytes and UA.  Patient is ambulatory have low suspicion for other infectious etiology  such as discitis, osteomyelitis. [AH]  1416 Urinalysis, Routine w reflex microscopic I reviewed the patient's urine which appears normal [AH]  1416 CBC(!) CBC with mildly elevated white count of unknown etiology [AH]    Clinical Course User Index [AH] Margarita Mail, PA-C                           Medical Decision Making Patient here with complaint of back pain, flank pain, no evidence of obstruction, hydronephrosis, UTI, pyelo or kidney stone.  Patient has notable degenerative changes in the lumbar spine.  Given the patient's clinical examination this appears to be musculoskeletal.  He has no other significant lab abnormalities except for mildly elevated white blood cell count which may be acute  phase reaction as he is in pain.  Patient given pain medications here with significant improvement in his back pain.  He does not appear to have any renal insufficiency and given that I will put the patient on Celebrex 200 mg twice daily for 10 days.  Advised the patient not to use any other NSAIDs.  He is already currently taking baclofen.  He has no red flag symptoms.  Normal reflexes in the lower extremities and is ambulatory.  Discussed outpatient follow-up, discussed that physical therapy may be helpful.  Discussed return precautions.  Patient appears appropriate for discharge at this time  Amount and/or Complexity of Data Reviewed Labs: ordered. Decision-making details documented in ED Course. Radiology: ordered.    Details: I personally reviewed the CT renal stone study which shows no acute findings, positive for degenerative changes of the lumbar spine  Risk Prescription drug management. Risk Details: Obesity Social factors: Patient is insured and has close outpatient follow-up           Final Clinical Impression(s) / ED Diagnoses Final diagnoses:  DDD (degenerative disc disease), lumbar  Lumbar back pain    Rx / DC Orders ED Discharge Orders     None         Margarita Mail, PA-C 06/02/22 1454    Sherwood Gambler, MD 06/06/22 2121

## 2022-06-10 DIAGNOSIS — J449 Chronic obstructive pulmonary disease, unspecified: Secondary | ICD-10-CM | POA: Diagnosis not present

## 2022-06-11 ENCOUNTER — Telehealth: Payer: Self-pay | Admitting: Acute Care

## 2022-06-11 DIAGNOSIS — J441 Chronic obstructive pulmonary disease with (acute) exacerbation: Secondary | ICD-10-CM | POA: Diagnosis not present

## 2022-06-12 DIAGNOSIS — R531 Weakness: Secondary | ICD-10-CM | POA: Diagnosis not present

## 2022-06-21 ENCOUNTER — Encounter: Payer: Medicare Other | Admitting: Pulmonary Disease

## 2022-06-21 DIAGNOSIS — Z006 Encounter for examination for normal comparison and control in clinical research program: Secondary | ICD-10-CM

## 2022-06-21 DIAGNOSIS — R911 Solitary pulmonary nodule: Secondary | ICD-10-CM

## 2022-06-21 NOTE — Research (Signed)
Title: [place]: CliNIcal Utility of ManaGement of Patients witH CT and LDCT Identified Pulmonary Nodules UsinG the Percepta NasAL Swab ClassifiEr -- with Familiarization   Protocol #: [id number] Sponsor: [employer]   Protocol Revision 1 dated 01Sep2022 and confirmed current on today's visit, IRB approved Revision 1 on 29Dec2022.   Objectives:  Primary: To evaluate if use of the Percepta Nasal Swab test in the diagnostic work up of newly identified pulmonary nodules reduces the number of invasive procedures in the group classified as low-risk by the test and that are benign as compared to a control group managed without a Percepta Nasal Swab test result.                   A newly identified nodule is defined as any nodule first identified on imaging                   <90 days prior to nasal sample collection that hasn't undergone a diagnostic                    procedure for the management of their index nodule prior to enrollment.                   CT imaging includes conventional CT, LDCT, HRCT                   Benign diagnosis is defined as a specific diagnosis of a benign condition,                    radiographic resolution or stability at ? 24 months, or no cytological,                    radiological, or pathological evidence of cancer.                   Procedures will be categorized as either invasive or non-invasive in the Data                    Management Plan (DMP). Secondary: To evaluate if use of the Percepta Nasal Swab test in the diagnostic work up of newly identified pulmonary nodules increases the proportion of subjects classified as high-risk by the test and have primary lung cancer that go directly to appropriate therapy as compared to a control group managed without a Percepta Nasal Swab test result.                    Proportion of subjects that go directly to appropriate therapy is defined as those                     subjects that undergo surgery, ablative  or other appropriate therapy as the next                     step after the Percepta Nasal Swab test result without intervening non-surgical                     procedures                             a. Non-surgical procedures include diagnostic PET, but not PET for                                   staging purposes.                             b. Appropriate therapies will be defined in the CRF.                    A newly identified nodule is defined as any nodule first identified on imaging                    <90 days prior to nasal sample collection.                    Lung cancer diagnosis is defined as established by cytology or pathology, or in                     circumstances where a presumptive diagnosis of cancer led to definitive                     ablative or other appropriate therapy without pathology. Key Inclusion Criteria:  Inclusion Criteria:  Able to tolerate nasal epithelial specimen collection  Signed written Informed Consent obtained  Subject clinical history available for review by sponsor and regulatory agencies  New nodule first identified on imaging < 90 days prior to nasal sample collection (index nodule)  CT report available for index nodule  23 - 16 years of age  Current or former smoker (>100 cigarettes in a lifetime)  Pulmonary nodule ?30 mm detected by CT  Key Exclusion Criteria: Exclusion Criteria  Subject has undergone a diagnostic procedure for the management of their index nodule after the index CT and prior to enrollment  Active cancer (other than non-melanoma skin cancer)  Prior primary lung cancer (prior non-lung cancer acceptable)  Prior participation in this study (i.e., subjects may not be enrolled more than once)  Current active treatment with an investigational device or drug (patients in trial follow up period are okay if intervention phase is complete)  Patient enrolled or planned to be enrolled in another clinical trial that may influence  management of the patient's nodule  Concurrent or planned use of tools or tests for assigning lung nodule risk of malignancy (e.g., genomic or proteomic blood tests) other than clinically validated risk calculators  Clinical Research Coordinator / Research RN note : This visit is for Stephen Warren Subject [id number] with DOB: 12-06-1955 on 11/Aug/2023 for the above protocol is an Enrollment Visit and is for purpose of research.    Subject expressed interest and consent in continuing as a study subject. Subject confirmed contact information (e.g. address, telephone, email). Subject thanked for participation in research and contribution to science.     During this visit on 11/Aug/2023 , the subject reviewed and signed the consent form, provided demographics, and had a nasal swab collected per the above referenced protocol. Please refer to the subject's paper source binder for further details.    Sub-I met/discussed the subject prior to consenting patient.  The sub-Investigator  Eric Form, NP was present for the consenting process.         Signed by Hollace Kinnier

## 2022-06-28 DIAGNOSIS — E1165 Type 2 diabetes mellitus with hyperglycemia: Secondary | ICD-10-CM | POA: Diagnosis not present

## 2022-06-28 DIAGNOSIS — I10 Essential (primary) hypertension: Secondary | ICD-10-CM | POA: Diagnosis not present

## 2022-07-02 ENCOUNTER — Other Ambulatory Visit: Payer: Self-pay | Admitting: Allergy & Immunology

## 2022-07-12 ENCOUNTER — Ambulatory Visit: Payer: Medicare Other | Admitting: Allergy & Immunology

## 2022-07-12 DIAGNOSIS — J441 Chronic obstructive pulmonary disease with (acute) exacerbation: Secondary | ICD-10-CM | POA: Diagnosis not present

## 2022-07-13 DIAGNOSIS — R531 Weakness: Secondary | ICD-10-CM | POA: Diagnosis not present

## 2022-07-25 ENCOUNTER — Other Ambulatory Visit: Payer: Self-pay | Admitting: Allergy & Immunology

## 2022-07-29 DIAGNOSIS — E1165 Type 2 diabetes mellitus with hyperglycemia: Secondary | ICD-10-CM | POA: Diagnosis not present

## 2022-07-29 DIAGNOSIS — I10 Essential (primary) hypertension: Secondary | ICD-10-CM | POA: Diagnosis not present

## 2022-08-02 ENCOUNTER — Encounter: Payer: Self-pay | Admitting: Allergy & Immunology

## 2022-08-02 ENCOUNTER — Ambulatory Visit (INDEPENDENT_AMBULATORY_CARE_PROVIDER_SITE_OTHER): Payer: Medicare Other | Admitting: Allergy & Immunology

## 2022-08-02 VITALS — BP 116/68 | HR 82 | Temp 97.7°F | Resp 18

## 2022-08-02 DIAGNOSIS — J449 Chronic obstructive pulmonary disease, unspecified: Secondary | ICD-10-CM | POA: Diagnosis not present

## 2022-08-02 DIAGNOSIS — J302 Other seasonal allergic rhinitis: Secondary | ICD-10-CM

## 2022-08-02 DIAGNOSIS — J3089 Other allergic rhinitis: Secondary | ICD-10-CM | POA: Diagnosis not present

## 2022-08-02 DIAGNOSIS — T7800XD Anaphylactic reaction due to unspecified food, subsequent encounter: Secondary | ICD-10-CM

## 2022-08-02 MED ORDER — CETIRIZINE HCL 10 MG PO TABS
10.0000 mg | ORAL_TABLET | Freq: Two times a day (BID) | ORAL | 1 refills | Status: DC | PRN
Start: 2022-08-02 — End: 2023-02-18

## 2022-08-02 MED ORDER — AZELASTINE HCL 0.1 % NA SOLN: 1.0000 | Freq: Two times a day (BID) | NASAL | 1 refills | Status: DC

## 2022-08-02 MED ORDER — ALBUTEROL SULFATE HFA 108 (90 BASE) MCG/ACT IN AERS: 2.0000 | INHALATION_SPRAY | RESPIRATORY_TRACT | 1 refills | Status: DC | PRN

## 2022-08-02 MED ORDER — MONTELUKAST SODIUM 10 MG PO TABS: 10.0000 mg | ORAL_TABLET | Freq: Every day | ORAL | 1 refills | Status: DC

## 2022-08-02 NOTE — Patient Instructions (Addendum)
1. Chronic rhinitis (grasses, ragweed, weeds, trees, indoor molds, outdoor molds, and dust mites) - Continue taking: Zyrtec (cetirizine) '10mg'$  tablet once daily, Singulair (montelukast) '10mg'$  daily, and Flonase (fluticasone) one spray per nostril daily - Start taking: Astelin one spray per nostril twice daily (can be used WITH the fluticasone) - You can use an extra dose of the antihistamine, if needed, for breakthrough symptoms.  - Consider nasal saline rinses 1-2 times daily to remove allergens from the nasal cavities as well as help with mucous clearance (this is especially helpful to do before the nasal sprays are given) - Information on allergy shots provided for better control of your symptoms.  - We can always do allergy shots if your symptoms become too severe.   2. 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  - You are doing great.   3. COPD GOLD III  - Continue to follow with Dr. Melvyn Novas. - Continue with Breztri 2 puffs twice daily. - Continue with supplemental oxygen. - Continue with albuterol 1 nebulizer treatment or 4 puffs metered-dose inhaler every 4 hours as needed.  4. Return in about 6 months (around 01/31/2023).    Please inform us of any Emergency Department visits, hospitalizations, or changes in symptoms. Call us before going to the ED for breathing or allergy symptoms since we might be able to fit you in for a sick visit. Feel free to contact us anytime with any questions, problems, or concerns.  It was a pleasure to see you again today!  Websites that have reliable patient information: 1. American Academy of Asthma, Allergy, and Immunology: www.aaaai.org 2. Food Allergy Research and Education (FARE): foodallergy.org 3. Mothers of Asthmatics: http://www.asthmacommunitynetwork.org 4. American College of Allergy, Asthma, and Immunology: www.acaai.org   COVID-19 Vaccine Information can be found at:  ShippingScam.co.uk For questions related to vaccine distribution or appointments, please email [email or web address] or call [phone].   We realize that you might be concerned about having an allergic reaction to the COVID19 vaccines. To help with that concern, WE ARE OFFERING THE COVID19 VACCINES IN OUR OFFICE! Ask the front desk for dates!     "Like" [place] on Facebook and Instagram for our latest updates!      A healthy democracy works best when [employer] participate! Make sure you are registered to vote! If you have moved or changed any of your contact information, you will need to get this updated before voting!  In some cases, you MAY be able to register to vote online: [email or web address]

## 2022-08-02 NOTE — Progress Notes (Signed)
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 Melvyn Novas  Plan/Recommendations:   1. Chronic rhinitis (grasses, ragweed, weeds, trees, indoor molds, outdoor molds, and dust mites) - Continue taking: Zyrtec (cetirizine) '10mg'$  tablet once daily, Singulair (montelukast) '10mg'$  daily, and Flonase (fluticasone) one spray per nostril daily - Start taking: Astelin one spray per nostril twice daily (can be used WITH the fluticasone) - You can use an extra dose of the antihistamine, if needed, for breakthrough symptoms.  - Consider nasal saline rinses 1-2 times daily to remove allergens from the nasal cavities as well as help with mucous clearance (this is especially helpful to do before the nasal sprays are given) - Information on allergy shots provided for better control of your symptoms.  - We can always do allergy shots if your symptoms become too severe.   2. 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  - You are doing great.   3. COPD GOLD III  - Continue to follow with Dr. Melvyn Novas. - Continue with Breztri 2 puffs twice daily. - Continue with supplemental oxygen. - Continue with albuterol 1 nebulizer treatment or 4 puffs metered-dose inhaler every 4 hours as needed.  4. Return in about 6 months (around 01/31/2023).    Subjective:   Stephen Warren is a 66 y.o. male presenting today for follow up of  Chief Complaint  Patient presents with   Follow-up    Stephen Warren has a history of the following: Patient Active Problem List   Diagnosis Date Noted   Former smoker 02/26/2022   Seasonal and perennial allergic rhinitis 05/18/2021   Anaphylactic shock due to adverse food reaction 05/18/2021   Upper airway cough syndrome  09/01/2020   Chronic respiratory failure with hypoxia ([place]) 05/11/2020   Morbid (severe) obesity due to excess calories ([place]) 05/11/2020   Duodenal ulcer    Symptomatic anemia 07/03/2019   Acute upper GI bleed 07/03/2019   COPD GOLD III     Depression    Panic attacks    S/P nasal septoplasty 07/01/2018   History of colonic polyps    Diverticulosis of colon without hemorrhage    Mucosal abnormality of colon    Encounter for screening colonoscopy 10/16/2015   Hemorrhoids 10/16/2015    History obtained from: chart review and patient.  Stephen Warren is a 66 y.o. male presenting for a follow up visit.  He was last seen in March 2023.  At that time, we continue with Zyrtec as well as Singulair and Flonase.  He was started Astelin as well.  For his COPD, he continues to follow with pulmonology.  He remains on Breztri 2 puffs twice daily as well as albuterol every 4 hours and supplemental oxygen.  He is already passed a trial diet and we recommended that he keep this in his repertoire.  Since the last visit, he has done fairly well.  Asthma/Respiratory Symptom History: He continues to follow with Dr. Melvyn Novas.  He reports that this is difficult at times. He remains on the Breztri two puffs BID. He was given a sample of another inhaler by Dr. Legrand Rams, but he has not started taking it until he talks to Dr. Melvyn Novas. He has not needed steroids for his  breathing.   He is in a study for pulmonary nodules. This is for a nasal swab to help with the diagnosis of pulmonary nodules.  He still gets a little bit of a stipend for this.  He last saw them around 1 month ago.  Dr. Leory Plowman Icard is the PI.  Allergic Rhinitis Symptom History: They are hit and miss. He remains on the cetirizine and the montelukast. He does take the Flonase daily, but he adds the azelastine as needed. He has not needed antibiotics since the last visit for sinus infections, but he does report that he gets sinusitis around 12 times per month.    Food Allergy Symptom History: He had some shrimp a month ago. He did fine with this. His neighbor comes over to split shrimp with him.   He has a lot of good things to say about his new neighbors.  He seems to have developed quite the community in his apartment complex.  Otherwise, there have been no changes to his past medical history, surgical history, family history, or social history.    Review of Systems  Constitutional: Negative.  Negative for fever, malaise/fatigue and weight loss.  HENT: Negative.  Negative for congestion, ear discharge and ear pain.   Eyes:  Negative for pain, discharge and redness.  Respiratory:  Negative for cough, sputum production, shortness of breath and wheezing.   Cardiovascular: Negative.  Negative for chest pain and palpitations.  Gastrointestinal:  Negative for abdominal pain, heartburn, nausea and vomiting.  Skin: Negative.  Negative for itching and rash.  Neurological:  Negative for dizziness and headaches.  Endo/Heme/Allergies:  Negative for environmental allergies. Does not bruise/bleed easily.       Objective:   Blood pressure 116/68, pulse 82, temperature 97.7 F (36.5 C), temperature source Temporal, resp. rate 18, SpO2 93 %. There is no height or weight on file to calculate BMI.    Physical Exam Vitals reviewed.  Constitutional:      Appearance: He is well-developed. He is obese.     Comments: Very joyful. Very talkative.   HENT:     Head: Normocephalic and atraumatic.     Right Ear: Tympanic membrane, ear canal and external ear normal.     Left Ear: Tympanic membrane, ear canal and external ear normal.     Nose: No nasal deformity, septal deviation or mucosal edema.     Right Turbinates: Enlarged, swollen and pale.     Left Turbinates: Enlarged, swollen and pale.     Right Sinus: No maxillary sinus tenderness or frontal sinus tenderness.     Left Sinus: No maxillary sinus tenderness or frontal sinus tenderness.     Comments:  No nasal polyps. Clear rhinorrhea.     Mouth/Throat:     Mouth: Mucous membranes are not pale and not dry.     Pharynx: Uvula midline.  Eyes:     General: Lids are normal. Allergic shiner present.        Right eye: No discharge.        Left eye: No discharge.     Conjunctiva/sclera: Conjunctivae normal.     Right eye: Right conjunctiva is not injected. No chemosis.    Left eye: Left conjunctiva is not injected. No chemosis.    Pupils: Pupils are equal, round, and reactive to light.  Cardiovascular:     Rate and Rhythm: Normal rate and regular rhythm.     Heart sounds: Normal heart sounds.  Pulmonary:     Effort:  Pulmonary effort is normal. No tachypnea, accessory muscle usage or respiratory distress.     Breath sounds: Wheezing present. No rhonchi or rales.     Comments: Breathing shallowly.  Able to speak in full sentences.  Wearing oxygen. Chest:     Chest wall: No tenderness.  Lymphadenopathy:     Cervical: No cervical adenopathy.  Skin:    General: Skin is warm.     Capillary Refill: Capillary refill takes less than 2 seconds.     Coloration: Skin is not pale.     Findings: No abrasion, erythema, petechiae or rash. Rash is not papular, urticarial or vesicular.     Comments: No eczematous lesions noted.   Neurological:     Mental Status: He is alert.  Psychiatric:        Behavior: Behavior is cooperative.      Diagnostic studies: none        Salvatore Marvel, MD  [facility]

## 2022-08-11 DIAGNOSIS — J441 Chronic obstructive pulmonary disease with (acute) exacerbation: Secondary | ICD-10-CM | POA: Diagnosis not present

## 2022-08-12 DIAGNOSIS — R531 Weakness: Secondary | ICD-10-CM | POA: Diagnosis not present

## 2022-08-19 ENCOUNTER — Ambulatory Visit (INDEPENDENT_AMBULATORY_CARE_PROVIDER_SITE_OTHER): Payer: Medicare Other | Admitting: Internal Medicine

## 2022-08-19 ENCOUNTER — Encounter: Payer: Self-pay | Admitting: Internal Medicine

## 2022-08-19 DIAGNOSIS — J9611 Chronic respiratory failure with hypoxia: Secondary | ICD-10-CM

## 2022-08-19 DIAGNOSIS — J449 Chronic obstructive pulmonary disease, unspecified: Secondary | ICD-10-CM | POA: Diagnosis not present

## 2022-08-19 NOTE — Patient Instructions (Addendum)
Plan A = Automatic = Always=    Breztri or Trelegy   Work on inhaler technique:  relax and gently blow all the way out then take a nice smooth full deep breath back in, triggering the inhaler at same time you start breathing in.  Hold breath in for at least  5 seconds if you can. Blow out trelegy or breztri  thru nose. Rinse and gargle with water when done.  If mouth or throat bother you at all,  try brushing teeth/gums/tongue with arm and hammer toothpaste/ make a slurry and gargle and spit out.     Plan B = Backup (to supplement plan A, not to replace it) Only use your albuterol inhaler as a rescue medication to be used if you can't catch your breath by resting or doing a relaxed purse lip breathing pattern.  - The less you use it, the better it will work when you need it. - Ok to use the inhaler up to 2 puffs  every 4 hours if you must but call for appointment if use goes up over your usual need - Don't leave home without it !!  (think of it like the spare tire for your car)   Plan C = Crisis (instead of Plan B but only if Plan B stops working) - only use your albuterol nebulizer if you first try Plan B and it fails to help > ok to use the nebulizer up to every 4 hours but if start needing it regularly call for immediate appointment   My office will be contacting you by phone for referral for Overnight 02 sats on RA  - if you don't hear back from my office within one week please call us back or notify us thru MyChart and we'll address it right away.    Make sure you check your oxygen saturation  AT  your highest level of activity (not after you stop)   to be sure it stays over 90% and adjust  02 flow upward to maintain this level if needed but remember to turn it back to previous settings when you stop (to conserve your supply).    Please schedule a follow up visit in 3 months but call sooner if needed

## 2022-08-19 NOTE — Assessment & Plan Note (Addendum)
Placed on 02 by Luan Pulling ? when - ONO RA 05/17/20  desat < 89% x 1 h 44 min  So rec continue noct 02 and titrate daytime to keep > 90%  -  08/31/2020   Walked RA  approx   200 ft  @ avg pace  stopped due to  Sob with sats 93% - 08/20/2021   Walked on RA x  3  lap(s) =  approx 525 ft  @  moderate pace with rollator, stopped due to sob x 2  with lowest 02 sats 96%  - 08/19/2022 patient walked 300 ft  walker assist on 3LO2 cont. Stopped after lap 2 due to being tired and SOB. Lowest sats = 94%   Advised :  3lpm hs Make sure you check your oxygen saturation  AT  your highest level of activity (not after you stop)   to be sure it stays over 90% and adjust  02 flow upward to maintain this level if needed but remember to turn it back to previous settings when you stop (to conserve your supply).

## 2022-08-19 NOTE — Progress Notes (Signed)
Stephen Warren, male    DOB: 27-Jul-1956,    MRN: [id number]   Brief patient profile:  76 yowm MM/quit smoking 2019  with h/o asthma all his life could never play sports using inhalers and nebs daily and worse 2006 met criteria for disability 2012 and arrived in [place] since 2014  And breathing worse even when quit smoking at wt  270 and followed by Stephen Warren so referred to pulmonary clinic 05/11/2020 by Dr  Legrand Rams      History of Present Illness  05/11/2020  Pulmonary/ 1st office eval/Stephen Warren on symbicort and spiriva Chief Complaint  Patient presents with   Pulmonary Consult    Referred by Dr Legrand Rams. Former Dr Stephen Warren pt.    Dyspnea:  Able to do 10 - 15 minutes in cooler air Cough: non Sleep: in recliner 30 degrees  SABA use: using hfa and neb 4 x daily  02 prn daytime  Up to 4lpm pulsed and sometimes checks sats and finds them usually 88 or above but not usually checking with ex  rec We will order Overnight pulse oximetry on Room air and let you know how you did  Make sure you check your oxygen saturations at highest level of activity Plan A = Automatic = Always=    breztri Take 2 puffs first thing in am and then another 2 puffs about 12 hours later.  Work on inhaler technique:  Plan B = Backup (to supplement plan A, not to replace it) Only use your albuterol inhaler as a rescue medication Plan C = Crisis (instead of Plan B but only if Plan B stops working) - only use your albuterol nebulizer if you first try Plan B Try albuterol 15 min before an activity that you know would make you short of breath and see if it makes any difference and if makes none then don't take it after activity unless you can't catch your breath.        08/31/2020  f/u ov/Stephen Warren re: GOLD III / breztri maint/ 02 dep hs and ex  Chief Complaint  Patient presents with   Follow-up    Breathing has been worse for the past month. He is SOB walking accross the room.   Dyspnea:  In cool weather does better still  not having to stop walking every 1.5 aisle  150 ft to mb is flat stop 3/4  never checks 02 - doesn't use it either unless "gives out"  Cough:  Dry cough daytime  Sleeping: on side bed flat/ 2 pillows  SABA use: using albuterol hfa qid  02: sitting still no 02  /2 liters at hs and pulsing on 2lpm with ex but not monitoring as rec  Worse for the last month with sneezing/ dry cough daytime  rec GERD diet   Protonix 40 mg Take 30-60 min before first meal of the day  Prednisone 10 mg take  4 each am x 2 days,   2 each am x 2 days,  1 each am x 2 days and stop (don't take until after blood test) Try albuterol 15 min before an activity that you know would make you short of breath      Make sure you check your oxygen saturations at highest level of activity to be sure it stays over 90%   - Allergy profile 08/31/2020 >  Eos 0.2 /  IgE  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 He has been wheezing some, relates to pollen allergy. He is using his proair 2-3 x per day and neb 1-2 x per day.   Dyspnea:  slt improvement able to do mb s stopping  Cough: daytime dry worse with pollen - much better p prednisone Sleeping: flt bed on side two pillows  SABA use: as above  02: 2lpm hs and not checking sats with ex but uses 02 when gets sob Covid status: x 3  Rec Plan A = Automatic = Always=    Breztri Take 2 puffs first thing in am and then another 2 puffs about 12 hours later.  Work on inhaler technique:  Plan B = Backup (to supplement plan A, not to replace it) Only use your albuterol inhaler as a rescue medication  Plan C = Crisis (instead of Plan B but only if Plan B stops working) - only use your albuterol nebulizer if you first try Plan B and it fails to help > ok to use the nebulizer up to every 4 hours but if start needing it  regularly call for immediate appointment Ok to Try albuterol 15 min before (try first the proair, next day the nebulizer, next day nothing) an activity that you know would make you short of breath Prednisone 10 mg take  4 each am x 2 days,   2 each am x 2 days,  1 each am x 2 days and stop  I am referring you to an allergist in [place]  >>>>  pos allergy to pollen, trees and dust and mold Rx Zyrtec (cetirizine) 59m tablet once daily, Singulair (montelukast) 171mdaily, and Flonase (fluticasone) one daily    08/20/2021  f/u ov/[place] office/Stephen Warren re: GOLD 3/ uacs maint on 02 hs and prn / maint on breztri  singulair/ zyrtec/ flonas  Chief Complaint  Patient presents with   Follow-up    3L O2 when SOB. 2.5L O2 when sitting. 3.5 L O2 at night time. Patient is coughing up greenish (?) mucus since last OV.   Dyspnea:  mb and and back 1/4 mile  total round trip flat not  titrating 02 as rec  Cough: ? Some better on allery rx  Sleeping: bed is flat 3 pillows SABA use: not pre challenging  02: 3 -3.5 lpm hs and 2.5 lpm at rest and 3 lpm to mb  Covid status: covid x 3  Lung cancer screening: per Dr FaMarrian Salvageo try albuterol 15 min before an activity (on alternating days)  that you know would usually make you short of breath    Make sure you check your oxygen saturation  at your highest level of activity  to be sure it stays over 90% Work on inhaler technique:   I really recommend you continue lung cancer program under Dr FaLegrand RamsCall me with any discrepancies in your medication list (like a balancing a checkbook)   Add: zpak for flare CB   02/25/2022  f/u ov/[place] office/Stephen Warren re: GOLD 3 / atopic maint on breztr 2bid /sLaurine BlazerChief Complaint  Patient presents with   Follow-up    Using 2.5LO2 cont sitting  3.5LO2 cont at night.  Breathing is about the same. Pollen bothering patient lately.   Dyspnea:  mb and back with rollator and sats  on 3lpm pulse usually maint around  96%  Cough: more with pollen with sneezing Sleeping: flat bed 3 pillows  SABA use: still not prechallenging  02: 3lpm  Covid status: vax x 4 never infected  Lung cancer screening: referred back 02/25/2022  Rec I will be referring you to resume your lung cancer screening  Work on inhaler technique:  Remember how golfers take practice swings - use empty symbicort to warm up  Ok to try albuterol 15 min before an activity (on alternating days)  that you know would usually make you short of breath  Depomerdrol 80 mg IM today    08/19/2022  f/u ov/[place] office/Stephen Warren re: GOLD 3  maint on breztri/singulair and prn 02   Chief Complaint  Patient presents with   Follow-up    Feels breathing is doing okay. Using 3.5LO2  Dyspnea:  mb and back x 150 ft x flat with rollator and 02 x 3lpm but not checking  Cough: minimal attributes to pnds Sleeping: flat bed, 3 pillows  SABA use: using neb each am  instead of breztri / hfa for [employer]  02: 3lpm hs wakes up feeling good no ha or hypersomnolence or head fog   No obvious day to day or daytime variability or assoc excess/ purulent sputum or mucus plugs or hemoptysis or cp or chest tightness, subjective wheeze or overt sinus or hb symptoms.   Sleeping  without nocturnal  or early am exacerbation  of respiratory  c/o's or need for noct saba. Also denies any obvious fluctuation of symptoms with weather or environmental changes or other aggravating or alleviating factors except as outlined above   No unusual exposure hx or h/o childhood pna/ asthma or knowledge of premature birth.  Current Allergies, Complete Past Medical History, Past Surgical History, Family History, and Social History were reviewed in [employer] record.  ROS  The following are not active complaints unless bolded Hoarseness, sore throat, dysphagia, dental problems, itching, sneezing,  nasal congestion or discharge of excess mucus or purulent secretions,  ear ache,   fever, chills, sweats, unintended wt loss or wt gain, classically pleuritic or exertional cp,  orthopnea pnd or arm/hand swelling  or leg swelling, presyncope, palpitations, abdominal pain, anorexia, nausea, vomiting, diarrhea  or change in bowel habits or change in bladder habits, change in stools or change in urine, dysuria, hematuria,  rash, arthralgias, visual complaints, headache, numbness, weakness or ataxia or problems with walking or coordination,  change in mood or  memory.        Current Meds  Medication Sig   albuterol (PROVENTIL) (2.5 MG/3ML) 0.083% nebulizer solution Take 2.5 mg by nebulization 4 (four) times daily.   albuterol (VENTOLIN HFA) 108 (90 Base) MCG/ACT inhaler Inhale 2 puffs into the lungs every 4 (four) hours as needed for wheezing or shortness of breath.   atorvastatin (LIPITOR) 20 MG tablet    azelastine (ASTELIN) 0.1 % nasal spray Place 1 spray into both nostrils 2 (two) times daily. Use in each nostril as directed   baclofen (LIORESAL) 20 MG tablet Take 20 mg by mouth daily.    BREZTRI AEROSPHERE 160-9-4.8 MCG/ACT AERO INHALE TWO PUFFS BY MOUTH TWICE DAILY   celecoxib (CELEBREX) 200 MG capsule Take 1 capsule (200 mg total) by mouth 2 (two) times daily.   cetirizine (ZYRTEC) 10 MG tablet Take 1 tablet (10 mg total) by mouth 2 (two) times daily as needed for allergies (Can take an extra dose during flare ups.).   citalopram (CELEXA) 20 MG tablet  Take 20 mg by mouth daily.   EPINEPHRINE 0.3 mg/0.3 mL IJ SOAJ injection Inject one dose intramuscularly for allergic reaction. May repeat one dose if needed after 5-15 minutes. Proceed to the ER   furosemide (LASIX) 20 MG tablet Take 20 mg by mouth daily as needed.   metFORMIN (GLUCOPHAGE) 500 MG tablet Take 500 mg by mouth 2 (two) times daily with a meal.   montelukast (SINGULAIR) 10 MG tablet Take 1 tablet (10 mg total) by mouth at bedtime.   pantoprazole (PROTONIX) 40 MG tablet TAKE 1 TABLET BY MOUTH DAILY 30-60  MINUTES BEFORE THE first meal of THE DAY   PROAIR HFA 108 (90 Base) MCG/ACT inhaler Inhale 1-2 puffs into the lungs 4 (four) times daily as needed for wheezing or shortness of breath.    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   Hemorrhoids    History of kidney stones    Insomnia    Knee pain    Panic attacks       Objective:    Wts  08/19/2022             308 02/25/2022             298  08/20/2021           311   03/13/2021              321  05/11/20 (!) 320 lb (145.2 kg)  09/16/19 (!) 315 lb (142.9 kg)  07/03/19 (!) 324 lb 8 oz (147.2 kg)     Vital signs reviewed  08/19/2022  - Note at rest 02 sats  94% on 3lpm cont    General appearance:    obese  amb with walker wm nad  "oh sure"  to most questions     HEENT : Oropharynx  clear/ edentulous       NECK :  without  apparent JVD/ palpable Nodes/TM    LUNGS: no acc muscle use,  Min barrel  contour chest wall with bilateral  slightly decreased bs s audible wheeze and  without cough on insp or exp maneuvers and min  Hyperresonant  to  percussion bilaterally    CV:  RRR  no s3 or murmur or increase in P2, and no edema   ABD: quite obese but  soft and nontender with pos end  insp Hoover's  in the supine position.  No bruits or organomegaly appreciated   MS:  Nl gait/ ext warm without deformities Or obvious joint restrictions  calf tenderness, cyanosis or clubbing     SKIN: warm and dry without lesions    NEURO:  alert, approp, nl sensorium with  no motor or cerebellar deficits apparent.                            Assessment

## 2022-08-19 NOTE — Assessment & Plan Note (Signed)
Quit smoking 2017  - PFT's  05/11/2020  FEV1 1.32 (33 % ) ratio 0.42  p 25 % improvement from saba p ? 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 08/31/2020    MM   Level 147  - 08/19/2022  After extensive coaching inhaler device,  effectiveness =   80% with hfa and DPI > trial of trelegy 100 at pt request    Group D (now reclassified as E) in terms of symptom/risk and laba/lama/ICS  therefore appropriate rx at this point >>>  breztri or trelegy per pt / insurance preference but needs to use indicators   1) point a to b ex tol 2) irritation of mouth/ throat more likely on DPI 3) perceived need for saba  Re latter: Re SABA :  I spent extra time with pt today reviewing appropriate use of albuterol for prn use on exertion with the following points: 1) saba is for relief of sob that does not improve by walking a slower pace or resting but rather if the pt does not improve after trying this first. 2) If the pt is convinced, as many are, that saba helps recover from activity faster then it's easy to tell if this is the case by re-challenging : ie stop, take the inhaler, then p 5 minutes try the exact same activity (intensity of workload) that just caused the symptoms and see if they are substantially diminished or not after saba 3) if there is an activity that reproducibly causes the symptoms, try the saba 15 min before the activity on alternate days   If in fact the saba really does help, then fine to continue to use it prn but advised may need to look closer at the maintenance regimen being used to achieve better control of airways disease with exertion.   F/u can be q 3 m for now, sooner if needed   Each maintenance medication was reviewed in detail including emphasizing most importantly the difference  between maintenance and prns and under what circumstances the prns are to be triggered using an action plan format where appropriate.  Total time for H and P, chart review, counseling, reviewing hfa/neb/02 device(s) , directly observing portions of ambulatory 02 saturation study/ and generating customized AVS unique to this office visit / same day charting = 37  min

## 2022-08-28 DIAGNOSIS — E1165 Type 2 diabetes mellitus with hyperglycemia: Secondary | ICD-10-CM | POA: Diagnosis not present

## 2022-08-30 DIAGNOSIS — R0683 Snoring: Secondary | ICD-10-CM | POA: Diagnosis not present

## 2022-08-30 DIAGNOSIS — G4730 Sleep apnea, unspecified: Secondary | ICD-10-CM | POA: Diagnosis not present

## 2022-09-06 ENCOUNTER — Other Ambulatory Visit: Payer: Self-pay

## 2022-09-06 DIAGNOSIS — J9611 Chronic respiratory failure with hypoxia: Secondary | ICD-10-CM

## 2022-09-11 DIAGNOSIS — J441 Chronic obstructive pulmonary disease with (acute) exacerbation: Secondary | ICD-10-CM | POA: Diagnosis not present

## 2022-09-12 DIAGNOSIS — Z23 Encounter for immunization: Secondary | ICD-10-CM | POA: Diagnosis not present

## 2022-09-12 DIAGNOSIS — R531 Weakness: Secondary | ICD-10-CM | POA: Diagnosis not present

## 2022-09-12 DIAGNOSIS — K219 Gastro-esophageal reflux disease without esophagitis: Secondary | ICD-10-CM | POA: Diagnosis not present

## 2022-09-12 DIAGNOSIS — E1165 Type 2 diabetes mellitus with hyperglycemia: Secondary | ICD-10-CM | POA: Diagnosis not present

## 2022-09-12 DIAGNOSIS — J449 Chronic obstructive pulmonary disease, unspecified: Secondary | ICD-10-CM | POA: Diagnosis not present

## 2022-09-12 DIAGNOSIS — Z9981 Dependence on supplemental oxygen: Secondary | ICD-10-CM | POA: Diagnosis not present

## 2022-09-16 DIAGNOSIS — R0683 Snoring: Secondary | ICD-10-CM | POA: Diagnosis not present

## 2022-09-16 DIAGNOSIS — G4730 Sleep apnea, unspecified: Secondary | ICD-10-CM | POA: Diagnosis not present

## 2022-09-24 ENCOUNTER — Telehealth: Payer: Self-pay | Admitting: Internal Medicine

## 2022-09-24 DIAGNOSIS — J9611 Chronic respiratory failure with hypoxia: Secondary | ICD-10-CM

## 2022-09-24 NOTE — Telephone Encounter (Signed)
ONO  on 2lpm is fine, no change in recs

## 2022-09-24 NOTE — Telephone Encounter (Signed)
Spoke with patient  regarding ONO results. They verbalized understanding. No further questions.  Nothing further needed at this time.  

## 2022-10-11 DIAGNOSIS — J441 Chronic obstructive pulmonary disease with (acute) exacerbation: Secondary | ICD-10-CM | POA: Diagnosis not present

## 2022-10-12 DIAGNOSIS — R531 Weakness: Secondary | ICD-10-CM | POA: Diagnosis not present

## 2022-10-12 DIAGNOSIS — E1165 Type 2 diabetes mellitus with hyperglycemia: Secondary | ICD-10-CM | POA: Diagnosis not present

## 2022-11-11 DIAGNOSIS — J441 Chronic obstructive pulmonary disease with (acute) exacerbation: Secondary | ICD-10-CM | POA: Diagnosis not present

## 2022-11-12 DIAGNOSIS — J449 Chronic obstructive pulmonary disease, unspecified: Secondary | ICD-10-CM | POA: Diagnosis not present

## 2022-11-12 DIAGNOSIS — E1165 Type 2 diabetes mellitus with hyperglycemia: Secondary | ICD-10-CM | POA: Diagnosis not present

## 2022-11-18 ENCOUNTER — Ambulatory Visit (HOSPITAL_COMMUNITY)
Admission: RE | Admit: 2022-11-18 | Discharge: 2022-11-18 | Disposition: A | Discharge status: Home / Self Care | Payer: Medicare Other | Source: Ambulatory Visit | Attending: Acute Care | Admitting: Acute Care

## 2022-11-18 DIAGNOSIS — R911 Solitary pulmonary nodule: Secondary | ICD-10-CM | POA: Diagnosis not present

## 2022-11-18 DIAGNOSIS — Z87891 Personal history of nicotine dependence: Secondary | ICD-10-CM | POA: Insufficient documentation

## 2022-11-20 ENCOUNTER — Other Ambulatory Visit: Payer: Self-pay | Admitting: Acute Care

## 2022-11-20 DIAGNOSIS — Z87891 Personal history of nicotine dependence: Secondary | ICD-10-CM

## 2022-11-20 DIAGNOSIS — Z122 Encounter for screening for malignant neoplasm of respiratory organs: Secondary | ICD-10-CM

## 2022-12-09 DIAGNOSIS — M25512 Pain in left shoulder: Secondary | ICD-10-CM | POA: Diagnosis not present

## 2022-12-09 DIAGNOSIS — Z880 Allergy status to penicillin: Secondary | ICD-10-CM | POA: Diagnosis not present

## 2022-12-09 DIAGNOSIS — Z9981 Dependence on supplemental oxygen: Secondary | ICD-10-CM | POA: Diagnosis not present

## 2022-12-09 DIAGNOSIS — W19XXXA Unspecified fall, initial encounter: Secondary | ICD-10-CM | POA: Diagnosis not present

## 2022-12-09 DIAGNOSIS — G8911 Acute pain due to trauma: Secondary | ICD-10-CM | POA: Diagnosis not present

## 2022-12-09 DIAGNOSIS — S40012A Contusion of left shoulder, initial encounter: Secondary | ICD-10-CM | POA: Diagnosis not present

## 2022-12-09 DIAGNOSIS — M25519 Pain in unspecified shoulder: Secondary | ICD-10-CM | POA: Diagnosis not present

## 2022-12-09 DIAGNOSIS — M19012 Primary osteoarthritis, left shoulder: Secondary | ICD-10-CM | POA: Diagnosis not present

## 2022-12-09 DIAGNOSIS — I10 Essential (primary) hypertension: Secondary | ICD-10-CM | POA: Diagnosis not present

## 2022-12-09 DIAGNOSIS — W1839XA Other fall on same level, initial encounter: Secondary | ICD-10-CM | POA: Diagnosis not present

## 2022-12-09 DIAGNOSIS — R0781 Pleurodynia: Secondary | ICD-10-CM | POA: Diagnosis not present

## 2022-12-09 DIAGNOSIS — I700 Atherosclerosis of aorta: Secondary | ICD-10-CM | POA: Diagnosis not present

## 2022-12-09 DIAGNOSIS — S20212A Contusion of left front wall of thorax, initial encounter: Secondary | ICD-10-CM | POA: Diagnosis not present

## 2022-12-09 NOTE — Progress Notes (Unsigned)
Stephen Warren, male    DOB: 1956/09/10     MRN: [id number]   Brief patient profile:  56 yowm MM/quit smoking 2019  with h/o asthma all his life could never play sports using inhalers and nebs daily and worse 2006 met criteria for disability 2012 and arrived in [place] since 2014  And breathing worse even when quit smoking at wt  270 and followed by Stephen Warren so referred to pulmonary clinic 05/11/2020 by Dr  Stephen Warren      History of Present Illness  05/11/2020  Pulmonary/ 1st office eval/Stephen Warren on symbicort and spiriva Chief Complaint  Patient presents with   Pulmonary Consult    Referred by Dr Stephen Warren. Former Dr Stephen Warren pt.    Dyspnea:  Able to do 10 - 15 minutes in cooler air Cough: non Sleep: in recliner 30 degrees  SABA use: using hfa and neb 4 x daily  02 prn daytime  Up to 4lpm pulsed and sometimes checks sats and finds them usually 88 or above but not usually checking with ex  rec We will order Overnight pulse oximetry on Room air and let you know how you did  Make sure you check your oxygen saturations at highest level of activity Plan A = Automatic = Always=    breztri Take 2 puffs first thing in am and then another 2 puffs about 12 hours later.  Work on inhaler technique:  Plan B = Backup (to supplement plan A, not to replace it) Only use your albuterol inhaler as a rescue medication Plan C = Crisis (instead of Plan B but only if Plan B stops working) - only use your albuterol nebulizer if you first try Plan B Try albuterol 15 min before an activity that you know would make you short of breath and see if it makes any difference and if makes none then don't take it after activity unless you can't catch your breath.        08/31/2020  f/u ov/[place] office/Stephen Warren re: GOLD III / breztri maint/ 02 dep hs and ex  Chief Complaint  Patient presents with   Follow-up    Breathing has been worse for the past month. He is SOB walking accross the room.   Dyspnea:  In cool weather does better  still not having to stop walking every 1.5 aisle  150 ft to mb is flat stop 3/4  never checks 02 - doesn't use it either unless "gives out"  Cough:  Dry cough daytime  Sleeping: on side bed flat/ 2 pillows  SABA use: using albuterol hfa qid  02: sitting still no 02  /2 liters at hs and pulsing on 2lpm with ex but not monitoring as rec  Worse for the last month with sneezing/ dry cough daytime  rec GERD diet   Protonix 40 mg Take 30-60 min before first meal of the day  Prednisone 10 mg take  4 each am x 2 days,   2 each am x 2 days,  1 each am x 2 days and stop (don't take until after blood test) Try albuterol 15 min before an activity that you know would make you short of breath      Make sure you check your oxygen saturations at highest level of activity to be sure it stays over 90%   - Allergy profile 08/31/2020 >  Eos 0.2 /  IgE  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 He has been wheezing some, relates to pollen allergy. He is using his proair 2-3 x per day and neb 1-2 x per day.   Dyspnea:  slt improvement able to do mb s stopping  Cough: daytime dry worse with pollen - much better p prednisone Sleeping: flt bed on side two pillows  SABA use: as above  02: 2lpm hs and not checking sats with ex but uses 02 when gets sob Covid status: x 3  Rec Plan A = Automatic = Always=    Breztri Take 2 puffs first thing in am and then another 2 puffs about 12 hours later.  Work on inhaler technique:  Plan B = Backup (to supplement plan A, not to replace it) Only use your albuterol inhaler as a rescue medication  Plan C = Crisis (instead of Plan B but only if Plan B stops working) - only use your albuterol nebulizer if you first try Plan B and it fails to help > ok to use the nebulizer up to every 4 hours but if start needing  it regularly call for immediate appointment Ok to Try albuterol 15 min before (try first the proair, next day the nebulizer, next day nothing) an activity that you know would make you short of breath Prednisone 10 mg take  4 each am x 2 days,   2 each am x 2 days,  1 each am x 2 days and stop  I am referring you to an allergist in [place]  >>>>  pos allergy to pollen, trees and dust and mold Rx Zyrtec (cetirizine) '10mg'$  tablet once daily, Singulair (montelukast) '10mg'$  daily, and Flonase (fluticasone) one daily    08/20/2021  f/u ov/[place] office/Stephen Warren re: GOLD 3/ uacs maint on 02 hs and prn / maint on breztri  singulair/ zyrtec/ flonas  Chief Complaint  Patient presents with   Follow-up    3L O2 when SOB. 2.5L O2 when sitting. 3.5 L O2 at night time. Patient is coughing up greenish (?) mucus since last OV.   Dyspnea:  mb and and back 1/4 mile  total round trip flat not  titrating 02 as rec  Cough: ? Some better on allery rx  Sleeping: bed is flat 3 pillows SABA use: not pre challenging  02: 3 -3.5 lpm hs and 2.5 lpm at rest and 3 lpm to mb  Covid status: covid x 3  Lung cancer screening: per Dr Stephen Warren to try albuterol 15 min before an activity (on alternating days)  that you know would usually make you short of breath    Make sure you check your oxygen saturation  at your highest level of activity  to be sure it stays over 90% Work on inhaler technique:   I really recommend you continue lung cancer program under Dr Stephen Warren  Call me with any discrepancies in your medication list (like a balancing a checkbook)   Add: zpak for flare CB   02/25/2022  f/u ov/[place] office/Stephen Warren re: GOLD 3 / atopic maint on breztr 2bid Stephen Warren  Chief Complaint  Patient presents with   Follow-up    Using 2.5LO2 cont sitting  3.5LO2 cont at night.  Breathing is about the same. Pollen bothering patient lately.   Dyspnea:  mb and back with rollator and sats  on 3lpm pulse usually maint  around   96%  Cough: more with pollen with sneezing Sleeping: flat bed 3 pillows  SABA use: still not prechallenging  02: 3lpm  Covid status: vax x 4 never infected  Lung cancer screening: referred back 02/25/2022  Rec I will be referring you to resume your lung cancer screening  Work on inhaler technique:  Remember how golfers take practice swings - use empty symbicort to warm up  Ok to try albuterol 15 min before an activity (on alternating days)  that you know would usually make you short of breath  Depomerdrol 80 mg IM today    08/19/2022  f/u ov/[place] office/Kenedi Cilia re: GOLD 3  maint on breztri/singulair and prn 02   Chief Complaint  Patient presents with   Follow-up    Feels breathing is doing okay. Using 3.5LO2  Dyspnea:  mb and back x 150 ft x flat with rollator and 02 x 3lpm but not checking  Cough: minimal attributes to pnds Sleeping: flat bed, 3 pillows  SABA use: using neb each am  instead of breztri / hfa for [employer]  02: 3lpm hs wakes up feeling good no ha or hypersomnolence or head fog Rec Plan A = Automatic = Always=    Breztri or Trelegy  Work on inhaler technique:  Plan B = Backup (to supplement plan A, not to replace it) Only use your albuterol inhaler as a rescue medication  Plan C = Crisis (instead of Plan B but only if Plan B stops working) - only use your albuterol nebulizer if you first try Plan B  My office will be contacting you by phone for referral for Overnight 02 sats on RA  Make sure you check your oxygen saturation  AT  your highest level of activity (not after you stop)   to be sure it stays over 90%  -  08/30/22 ONO RA desats x 1 h 46 min < 89%    -  09/16/22  ONO on 2lpm with < 5 min at less than 89% so no changes needed    12/10/2022  f/u ov/[place] office/Nayah Lukens re: GOLD 3  maint on trelegy   Chief Complaint  Patient presents with   Follow-up    Patient feels breathing is about the same  Had a fall yesterday  Dyspnea:  mb and back with  rollator and 02 3 lpm but not checking sats Cough: minimal  Sleeping: flat bed 3 pillows  SABA use: 4 x a day /nebulizer also about the same, not following ABC instructions or ever prechallenging  02: 2lpm at hs and up to  3 lpm    Lung cancer screening: in program    No obvious day to day or daytime variability or assoc excess/ purulent sputum or mucus plugs or hemoptysis or cp or chest tightness, subjective wheeze or overt sinus or hb symptoms.   Sleeping most nights  without nocturnal  or early am exacerbation  of respiratory  c/o's or need for noct saba. Also denies any obvious fluctuation of symptoms with weather or environmental changes or other aggravating or alleviating factors except as outlined above   No unusual exposure hx or h/o childhood pna/ asthma or knowledge of premature birth.  Current Allergies, Complete Past Medical History, Past Surgical History, Family History, and Social History were reviewed in [employer] record.  ROS  The following are not active complaints unless bolded Hoarseness, sore throat, dysphagia, dental problems, itching, sneezing,  nasal congestion or discharge of  excess mucus or purulent secretions, ear ache,   fever, chills, sweats, unintended wt loss or wt gain, classically pleuritic or exertional cp,  orthopnea pnd or arm/hand swelling  or leg swelling, presyncope, palpitations, abdominal pain, anorexia, nausea, vomiting, diarrhea  or change in bowel habits or change in bladder habits, change in stools or change in urine, dysuria, hematuria,  rash, arthralgias, visual complaints, headache, numbness, weakness or ataxia or problems with walking or coordination/ using rollator,  change in mood or  memory.        Current Meds  Medication Sig   albuterol (PROVENTIL) (2.5 MG/3ML) 0.083% nebulizer solution Take 2.5 mg by nebulization 4 (four) times daily.   albuterol (VENTOLIN HFA) 108 (90 Base) MCG/ACT inhaler Inhale 2 puffs into the  lungs every 4 (four) hours as needed for wheezing or shortness of breath.   atorvastatin (LIPITOR) 20 MG tablet    baclofen (LIORESAL) 20 MG tablet Take 20 mg by mouth daily.    celecoxib (CELEBREX) 200 MG capsule Take 1 capsule (200 mg total) by mouth 2 (two) times daily.   citalopram (CELEXA) 20 MG tablet Take 20 mg by mouth daily.   EPINEPHRINE 0.3 mg/0.3 mL IJ SOAJ injection Inject one dose intramuscularly for allergic reaction. May repeat one dose if needed after 5-15 minutes. Proceed to the ER   furosemide (LASIX) 20 MG tablet Take 20 mg by mouth daily as needed.   metFORMIN (GLUCOPHAGE) 500 MG tablet Take 500 mg by mouth 2 (two) times daily with a meal.   montelukast (SINGULAIR) 10 MG tablet Take 1 tablet (10 mg total) by mouth at bedtime.   pantoprazole (PROTONIX) 40 MG tablet TAKE 1 TABLET BY MOUTH DAILY 30-60 MINUTES BEFORE THE first meal of THE DAY   PROAIR HFA 108 (90 Base) MCG/ACT inhaler Inhale 1-2 puffs into the lungs 4 (four) times daily as needed for wheezing or shortness of breath.    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   Hemorrhoids    History of kidney stones    Insomnia    Knee pain    Panic attacks       Objective:    Wts  12/10/2022            304 08/19/2022             308 02/25/2022             298  08/20/2021           311   03/13/2021              321  05/11/20 (!) 320 lb (145.2 kg)  09/16/19 (!) 315 lb (142.9 kg)  07/03/19 (!) 324 lb 8 oz (147.2 kg)    Vital signs reviewed  12/10/2022  - Note at rest 02 sats  95% on 3lpm    General appearance:    obese wm using rollator    HEENT : Oropharynx  clear     NECK :  without  apparent  JVD/ palpable Nodes/TM  LUNGS: no acc muscle use,  Min barrel  contour chest wall with bilateral  slightly decreased bs s audible wheeze and  without cough on insp or exp maneuvers and min  Hyperresonant  to  percussion bilaterally    CV:  RRR  no s3 or murmur or increase in P2, and no edema   ABD:  quite obese but soft and nontender    MS:  Nl gait/ ext warm without deformities Or obvious joint restrictions  calf tenderness, cyanosis or clubbing     SKIN: warm and dry without lesions    NEURO:  alert, approp, nl sensorium with  no motor or cerebellar deficits apparent.              I personally reviewed images and agree with radiology impression as follows:   Chest LDSCT     1//8/24 1. Lung-RADS 2S, benign appearance or behavior. Continue annual screening with low-dose chest CT without contrast in 12 months. 2. The "S" modifier above refers to potentially clinically significant non lung cancer related findings. Specifically, there is aortic atherosclerosis, in addition to left main and three-vessel coronary artery disease  3. Mild diffuse bronchial wall thickening with mild centrilobular and paraseptal emphysema; imaging findings suggestive of underlying COPD. 4. Hepatic steatosis.  5 Aortic Atherosclerosis (ICD10-I70.0) and Emphysema (ICD10-J43.9)         Assessment

## 2022-12-10 ENCOUNTER — Encounter: Payer: Self-pay | Admitting: Internal Medicine

## 2022-12-10 ENCOUNTER — Ambulatory Visit (INDEPENDENT_AMBULATORY_CARE_PROVIDER_SITE_OTHER): Payer: 59 | Admitting: Internal Medicine

## 2022-12-10 VITALS — BP 132/80 | HR 72 | Temp 97.8°F | Ht 73.0 in | Wt 304.2 lb

## 2022-12-10 DIAGNOSIS — J9611 Chronic respiratory failure with hypoxia: Secondary | ICD-10-CM

## 2022-12-10 DIAGNOSIS — J449 Chronic obstructive pulmonary disease, unspecified: Secondary | ICD-10-CM

## 2022-12-10 NOTE — Patient Instructions (Addendum)
Also  Ok to try albuterol 15 min before an activity (on alternating days between your inhaler and nebulizer and nothing )  that you know would usually make you short of breath and see if it makes any difference and if makes none then don't take albuterol after activity unless you can't catch your breath as this means it's the resting that helps, not the albuterol.  Make sure you check your oxygen saturation  AT  your highest level of activity (not after you stop)   to be sure it stays over 90% and adjust  02 flow upward to maintain this level if needed but remember to turn it back to previous settings when you stop (to conserve your supply).    Please schedule a follow up visit in 3 months but call sooner if needed

## 2022-12-10 NOTE — Assessment & Plan Note (Signed)
Placed on 02 by Luan Pulling ? when - ONO RA 05/17/20  desat < 89% x 1 h 44 min  So rec continue noct 02 and titrate daytime to keep > 90%  -  08/31/2020   Walked RA  approx   200 ft  @ avg pace  stopped due to  Sob with sats 93% - 08/20/2021   Walked on RA x  3  lap(s) =  approx 525 ft  @  moderate pace with rollator, stopped due to sob x 2  with lowest 02 sats 96%  - 08/19/2022 patient walked 300 ft  walker assist on 3LO2 cont. Stopped after lap 2 due to being tired and SOB. Lowest sats = 94%  - 08/30/22 ONO RA desats x 1 h 46 min < 89%    -  09/16/22  ONO on 2lpm with < 5 min at less than 89% so no changes needed   Rec 2lpm hs and Make sure you check your oxygen saturation  AT  your highest level of activity (not after you stop)   to be sure it stays over 90% and adjust  02 flow upward to maintain this level if needed but remember to turn it back to previous settings when you stop (to conserve your supply).   F/u q 3 m, sooner prn          Each maintenance medication was reviewed in detail including emphasizing most importantly the difference between maintenance and prns and under what circumstances the prns are to be triggered using an action plan format where appropriate.  Total time for H and P, chart review, counseling, reviewing dpi/hfa/neb/02  device(s) and generating customized AVS unique to this office visit / same day charting = 63mn

## 2022-12-10 NOTE — Assessment & Plan Note (Signed)
Quit smoking 2017  - PFT's  05/11/2020  FEV1 1.32 (33 % ) ratio 0.42  p 25 % improvement from saba p ? 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 08/31/2020    MM   Level 147  - 08/19/2022  After extensive coaching inhaler device,  effectiveness =   80% with hfa and DPI > trial of trelegy 100 at pt request   Seems to be doing just as well on trelegy but still misunderstanding how and when to use what saba; Re SABA :  I spent extra time with pt today reviewing appropriate use of albuterol for prn use on exertion with the following points: 1) saba is for relief of sob that does not improve by walking a slower pace or resting but rather if the pt does not improve after trying this first. 2) If the pt is convinced, as many are, that saba helps recover from activity faster then it's easy to tell if this is the case by re-challenging : ie stop, take the inhaler, then p 5 minutes try the exact same activity (intensity of workload) that just caused the symptoms and see if they are substantially diminished or not after saba 3) if there is an activity that reproducibly causes the symptoms, try the saba 15 min before the activity on alternate days   If in fact the saba really does help, then fine to continue to use it prn but advised may need to look closer at the maintenance regimen being used to achieve better control of airways disease with exertion.

## 2022-12-12 DIAGNOSIS — J441 Chronic obstructive pulmonary disease with (acute) exacerbation: Secondary | ICD-10-CM | POA: Diagnosis not present

## 2022-12-13 DIAGNOSIS — J449 Chronic obstructive pulmonary disease, unspecified: Secondary | ICD-10-CM | POA: Diagnosis not present

## 2022-12-13 DIAGNOSIS — E1165 Type 2 diabetes mellitus with hyperglycemia: Secondary | ICD-10-CM | POA: Diagnosis not present

## 2022-12-17 ENCOUNTER — Other Ambulatory Visit: Payer: Self-pay | Admitting: Allergy & Immunology

## 2023-01-15 IMAGING — DX DG CHEST 2V
2 series · 2 of 2 positions shown · non-contrast
Comparison: July 03, 2019 and September 05, 2020

CLINICAL DATA: Cough.

EXAM:
CHEST - 2 VIEW

[chest pa]
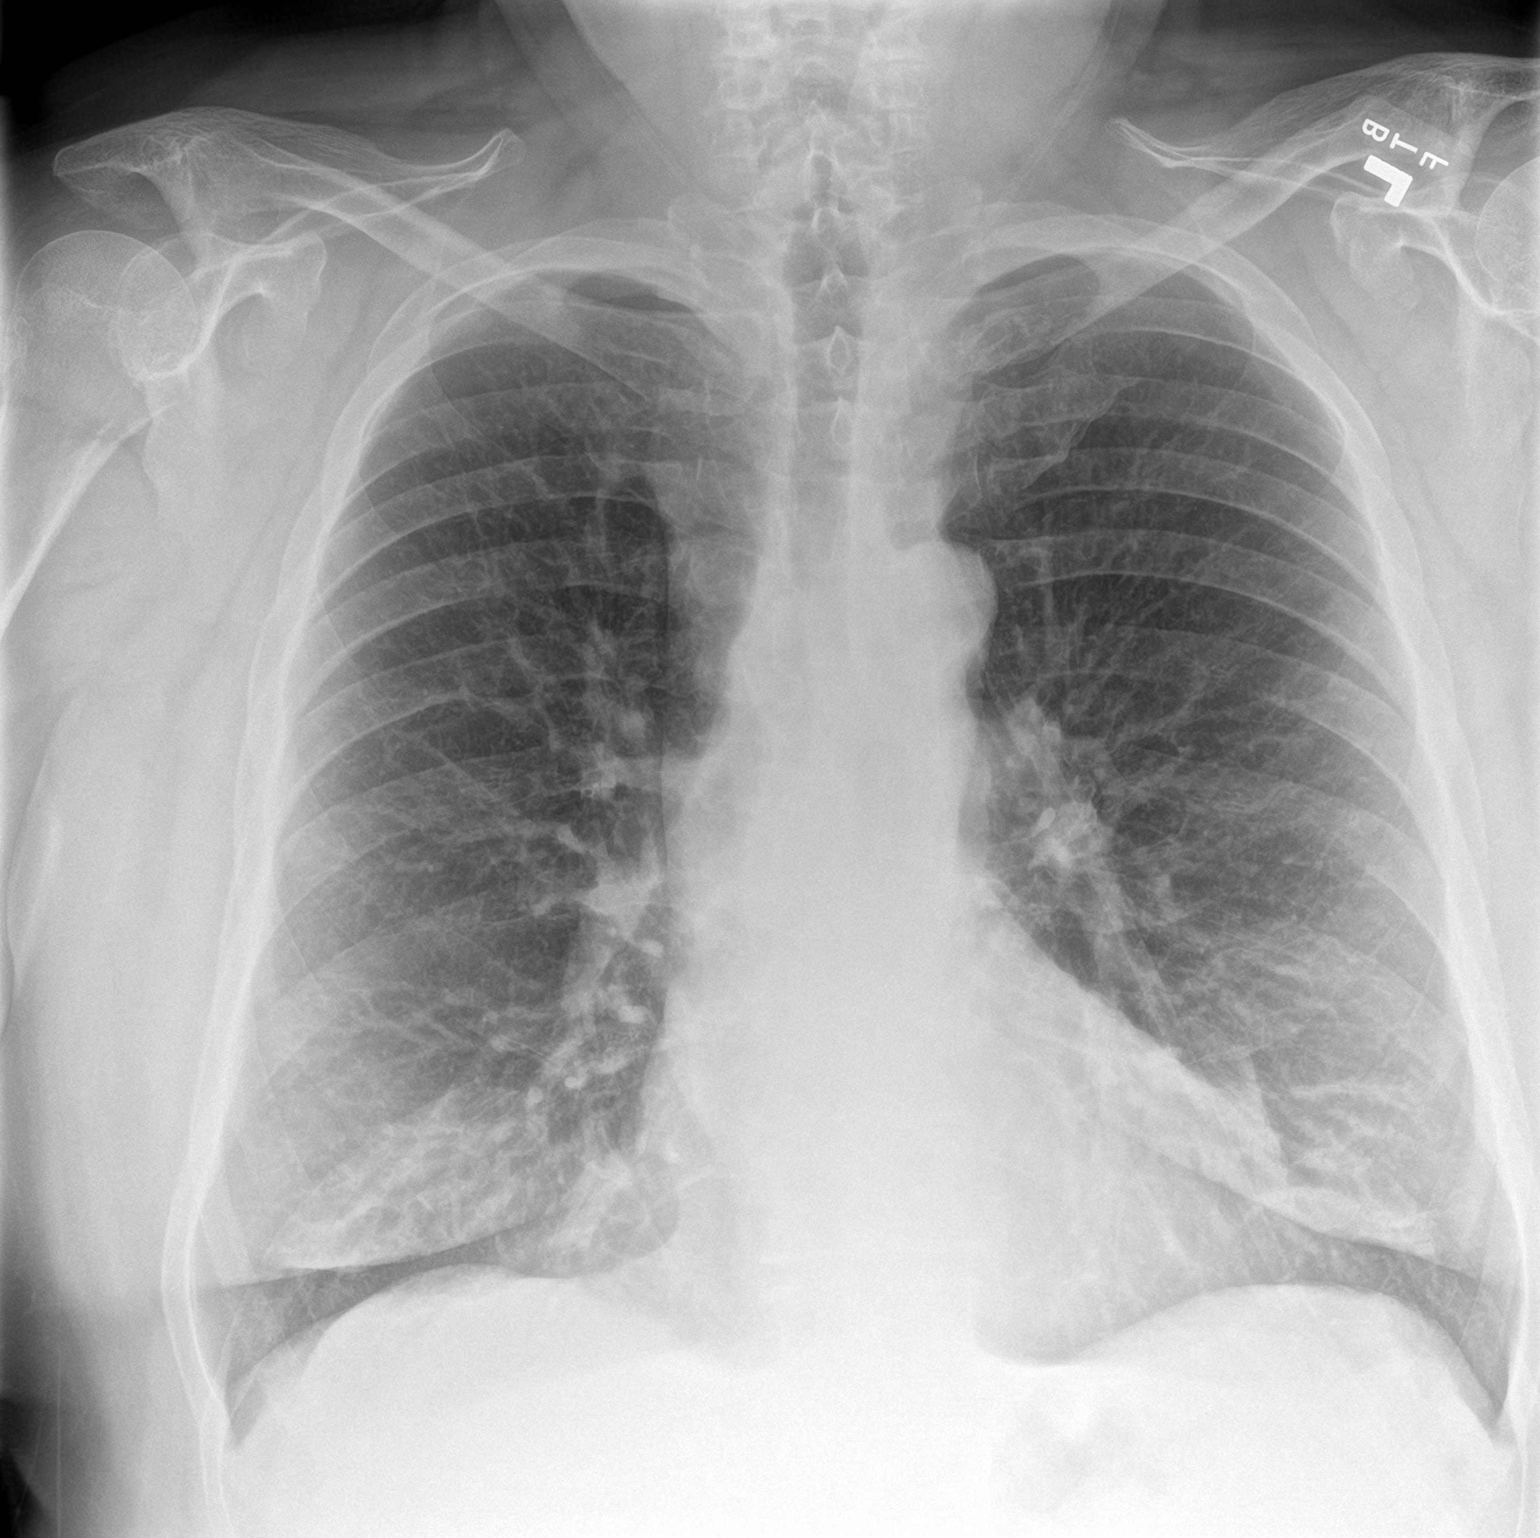

[chest lat]
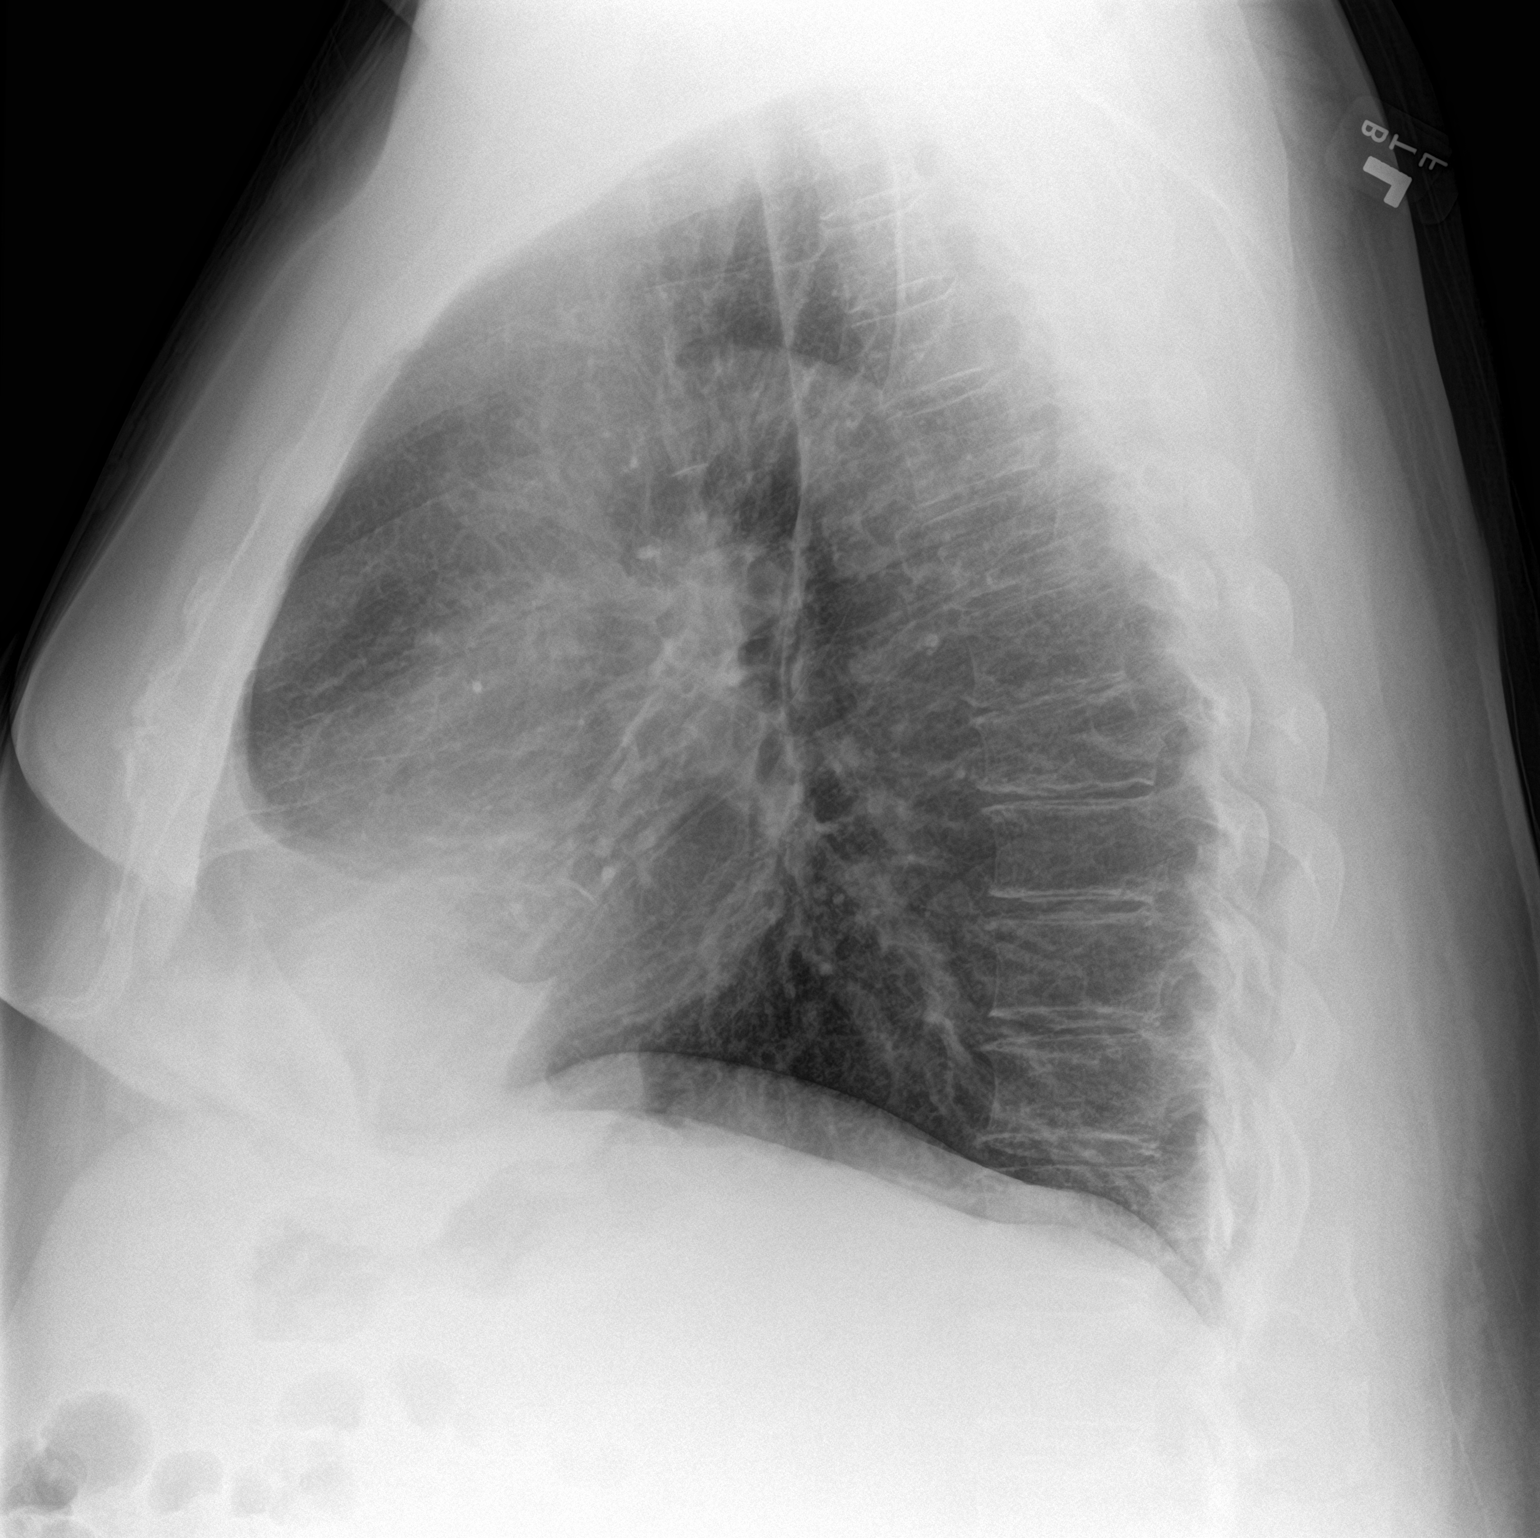

[2 of 2 positions shown; findings below may reference images not displayed]

FINDINGS: No pneumothorax. Bibasilar opacities are identified. The
cardiomediastinal silhouette is stable. No nodules or masses. No
other acute abnormalities.
IMPRESSION: Bibasilar opacities are identified, possibly representing pneumonia
or aspiration. Recommend short-term follow-up imaging to ensure
resolution.

## 2023-01-20 ENCOUNTER — Other Ambulatory Visit: Payer: Self-pay | Admitting: Allergy & Immunology

## 2023-01-31 ENCOUNTER — Other Ambulatory Visit: Payer: Self-pay

## 2023-01-31 ENCOUNTER — Encounter: Payer: Self-pay | Admitting: Allergy & Immunology

## 2023-01-31 ENCOUNTER — Ambulatory Visit (INDEPENDENT_AMBULATORY_CARE_PROVIDER_SITE_OTHER): Payer: 59 | Admitting: Allergy & Immunology

## 2023-01-31 VITALS — BP 138/80 | HR 98 | Temp 98.0°F | Resp 20 | Ht 73.0 in | Wt 305.0 lb

## 2023-01-31 DIAGNOSIS — J3089 Other allergic rhinitis: Secondary | ICD-10-CM | POA: Diagnosis not present

## 2023-01-31 DIAGNOSIS — J449 Chronic obstructive pulmonary disease, unspecified: Secondary | ICD-10-CM | POA: Diagnosis not present

## 2023-01-31 MED ORDER — MONTELUKAST SODIUM 10 MG PO TABS: 10.0000 mg | ORAL_TABLET | Freq: Every day | ORAL | 1 refills | Status: DC

## 2023-01-31 MED ORDER — LEVOCETIRIZINE DIHYDROCHLORIDE 5 MG PO TABS: 5.0000 mg | ORAL_TABLET | Freq: Every evening | ORAL | 1 refills | Status: DC

## 2023-01-31 NOTE — Patient Instructions (Addendum)
1. Chronic rhinitis - Previous testing showed: grasses, ragweed, weeds, trees, indoor molds, outdoor molds, and dust mites - Stop the Zyrtec and start [name] (we will send in a new prescription). - Continue taking: Singulair (montelukast) 10mg  daily, and Flonase (fluticasone) one spray per nostril daily and Astelin (azelastine) one spray per nostril up to twice daily - You can use an extra dose of the antihistamine, if needed, for breakthrough symptoms.  - Consider nasal saline rinses 1-2 times daily to remove allergens from the nasal cavities as well as help with mucous clearance (this is especially helpful to do before the nasal sprays are given) - Strongly consider allergy shots as a means of long-term control. - Allergy shots "re-train" and "reset" the immune system to ignore environmental allergens and decrease the resulting immune response to those allergens (sneezing, itchy watery eyes, runny nose, nasal congestion, etc).    - Allergy shots improve symptoms in 75-85% of patients.   2. COPD GOLD III  - Continue to follow with Dr. Melvyn Novas. - Lung testing looks fairly stable. - Try stopping the Trelegy and restarting Breztri two puffs twice daily (to see if the dizziness gets better). - Do this for two weeks and see how things go.  - Your next appointment with Dr. Melvyn Novas is May 6th.   3. Return in about 6 months (around 08/03/2023).    Please inform us of any Emergency Department visits, hospitalizations, or changes in symptoms. Call us before going to the ED for breathing or allergy symptoms since we might be able to fit you in for a sick visit. Feel free to contact us anytime with any questions, problems, or concerns.  It was a pleasure to meet you today!  Websites that have reliable patient information: 1. American Academy of Asthma, Allergy, and Immunology: www.aaaai.org 2. Food Allergy Research and Education (FARE): foodallergy.org 3. Mothers of Asthmatics:  http://www.asthmacommunitynetwork.org 4. American College of Allergy, Asthma, and Immunology: www.acaai.org   COVID-19 Vaccine Information can be found at: [email or web address] For questions related to vaccine distribution or appointments, please email [email or web address] or call [phone].   We realize that you might be concerned about having an allergic reaction to the COVID19 vaccines. To help with that concern, WE ARE OFFERING THE COVID19 VACCINES IN OUR OFFICE! Ask the front desk for dates!     "Like" [place] on Facebook and Instagram for our latest updates!      A healthy democracy works best when [employer] participate! Make sure you are registered to vote! If you have moved or changed any of your contact information, you will need to get this updated before voting!  In some cases, you MAY be able to register to vote online: [email or web address]    Reducing Pollen Exposure  The American Academy of Allergy, Asthma and Immunology suggests the following steps to reduce your exposure to pollen during allergy seasons.    Do not hang sheets or clothing out to dry; pollen may collect on these items. Do not mow lawns or spend time around freshly cut grass; mowing stirs up pollen. Keep windows closed at night.  Keep car windows closed while driving. Minimize morning activities outdoors, a time when pollen counts are usually at their highest. Stay indoors as much as possible when pollen counts or humidity is high and on windy days when pollen tends to remain in the air longer. Use air conditioning when possible.  Many air conditioners have filters that trap the pollen spores. Use a HEPA room air filter  to remove pollen form the indoor air you breathe.  Control of Mold Allergen   Mold and fungi can grow on a variety of surfaces provided certain temperature and moisture conditions exist.  Outdoor molds  grow on plants, decaying vegetation and soil.  The major outdoor mold, Alternaria and Cladosporium, are found in very high numbers during hot and dry conditions.  Generally, a late Summer - Fall peak is seen for common outdoor fungal spores.  Rain will temporarily lower outdoor mold spore count, but counts rise rapidly when the rainy period ends.  The most important indoor molds are Aspergillus and Penicillium.  Dark, humid and poorly ventilated basements are ideal sites for mold growth.  The next most common sites of mold growth are the bathroom and the kitchen.  Outdoor (Seasonal) Mold Control  Positive outdoor molds via skin testing: Alternaria, Cladosporium, Bipolaris (Helminthsporium), Drechslera (Curvalaria), and Mucor  Use air conditioning and keep windows closed Avoid exposure to decaying vegetation. Avoid leaf raking. Avoid grain handling. Consider wearing a face mask if working in moldy areas.    Indoor (Perennial) Mold Control   Positive indoor molds via skin testing: Aspergillus, Penicillium, Fusarium, Aureobasidium (Pullulara), and Rhizopus  Maintain humidity below 50%. Clean washable surfaces with 5% bleach solution. Remove sources e.g. contaminated carpets.    Control of Dust Mite Allergen    Dust mites play a major role in allergic asthma and rhinitis.  They occur in environments with high humidity wherever human skin is found.  Dust mites absorb humidity from the atmosphere (ie, they do not drink) and feed on organic matter (including shed human and animal skin).  Dust mites are a microscopic type of insect that you cannot see with the naked eye.  High levels of dust mites have been detected from mattresses, pillows, carpets, upholstered furniture, bed covers, clothes, soft toys and any woven material.  The principal allergen of the dust mite is found in its feces.  A gram of dust may contain 1,000 mites and 250,000 fecal particles.  Mite antigen is easily measured in the  air during house cleaning activities.  Dust mites do not bite and do not cause harm to humans, other than by triggering allergies/asthma.    Ways to decrease your exposure to dust mites in your home:  Encase mattresses, box springs and pillows with a mite-impermeable barrier or cover   Wash sheets, blankets and drapes weekly in hot water (130 F) with detergent and dry them in a dryer on the hot setting.  Have the room cleaned frequently with a vacuum cleaner and a damp dust-mop.  For carpeting or rugs, vacuuming with a vacuum cleaner equipped with a high-efficiency particulate air (HEPA) filter.  The dust mite allergic individual should not be in a room which is being cleaned and should wait 1 hour after cleaning before going into the room. Do not sleep on upholstered furniture (eg, couches).   If possible removing carpeting, upholstered furniture and drapery from the home is ideal.  Horizontal blinds should be eliminated in the rooms where the person spends the most time (bedroom, study, television room).  Washable vinyl, roller-type shades are optimal. Remove all non-washable stuffed toys from the bedroom.  Wash stuffed toys weekly like sheets and blankets above.   Reduce indoor humidity to less than 50%.  Inexpensive humidity monitors can be purchased at most hardware stores.  Do not use a humidifier as can make the problem worse and are not recommended.  Allergy Shots  Allergies are the result of a chain reaction that starts in the immune system. Your immune system controls how your body defends itself. For instance, if you have an allergy to pollen, your immune system identifies pollen as an invader or allergen. Your immune system overreacts by producing antibodies called Immunoglobulin E (IgE). These antibodies travel to cells that release chemicals, causing an allergic reaction.  The concept behind allergy immunotherapy, whether it is received in the form of shots or tablets, is that the  immune system can be desensitized to specific allergens that trigger allergy symptoms. Although it requires time and patience, the payback can be long-term relief.  How Do Allergy Shots Work?  Allergy shots work much like a vaccine. Your body responds to injected amounts of a particular allergen given in increasing doses, eventually developing a resistance and tolerance to it. Allergy shots can lead to decreased, minimal or no allergy symptoms.  There generally are two phases: build-up and maintenance. Build-up often ranges from three to six months and involves receiving injections with increasing amounts of the allergens. The shots are typically given once or twice a week, though more rapid build-up schedules are sometimes used.  The maintenance phase begins when the most effective dose is reached. This dose is different for each person, depending on how allergic you are and your response to the build-up injections. Once the maintenance dose is reached, there are longer periods between injections, typically two to four weeks.  Occasionally doctors give cortisone-type shots that can temporarily reduce allergy symptoms. These types of shots are different and should not be confused with allergy immunotherapy shots.  Who Can Be Treated with Allergy Shots?  Allergy shots may be a good treatment approach for people with allergic rhinitis (hay fever), allergic asthma, conjunctivitis (eye allergy) or stinging insect allergy.   Before deciding to begin allergy shots, you should consider:   The length of allergy season and the severity of your symptoms  Whether medications and/or changes to your environment can control your symptoms  Your desire to avoid long-term medication use  Time: allergy immunotherapy requires a major time commitment  Cost: may vary depending on your insurance coverage  Allergy shots for children age 68 and older are effective and often well tolerated. They might prevent the  onset of new allergen sensitivities or the progression to asthma.  Allergy shots are not started on patients who are pregnant but can be continued on patients who become pregnant while receiving them. In some patients with other medical conditions or who take certain common medications, allergy shots may be of risk. It is important to mention other medications you talk to your allergist.   When Will I Feel Better?  Some may experience decreased allergy symptoms during the build-up phase. For others, it may take as long as 12 months on the maintenance dose. If there is no improvement after a year of maintenance, your allergist will discuss other treatment options with you.  If you aren't responding to allergy shots, it may be because there is not enough dose of the allergen in your vaccine or there are missing allergens that were not identified during your allergy testing. Other reasons could be that there are high levels of the allergen in your environment or major exposure to non-allergic triggers like tobacco smoke.  What Is the Length of Treatment?  Once the maintenance dose is reached, allergy shots are generally continued for three to five years. The decision to stop should be discussed with  your allergist at that time. Some people may experience a permanent reduction of allergy symptoms. Others may relapse and a longer course of allergy shots can be considered.  What Are the Possible Reactions?  The two types of adverse reactions that can occur with allergy shots are local and systemic. Common local reactions include very mild redness and swelling at the injection site, which can happen immediately or several hours after. A systemic reaction, which is less common, affects the entire body or a particular body system. They are usually mild and typically respond quickly to medications. Signs include increased allergy symptoms such as sneezing, a stuffy nose or hives.  Rarely, a serious systemic  reaction called anaphylaxis can develop. Symptoms include swelling in the throat, wheezing, a feeling of tightness in the chest, nausea or dizziness. Most serious systemic reactions develop within 30 minutes of allergy shots. This is why it is strongly recommended you wait in your doctor's office for 30 minutes after your injections. Your allergist is trained to watch for reactions, and his or her staff is trained and equipped with the proper medications to identify and treat them.  Who Should Administer Allergy Shots?  The preferred location for receiving shots is your prescribing allergist's office. Injections can sometimes be given at another facility where the physician and staff are trained to recognize and treat reactions, and have received instructions by your prescribing allergist.

## 2023-01-31 NOTE — Progress Notes (Signed)
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 Melvyn Novas  Plan/Recommendations:   1. Chronic rhinitis - Previous testing showed: grasses, ragweed, weeds, trees, indoor molds, outdoor molds, and dust mites - Stop the Zyrtec and start [name] (we will send in a new prescription). - Continue taking: Singulair (montelukast) 10mg  daily, and Flonase (fluticasone) one spray per nostril daily and Astelin (azelastine) one spray per nostril up to twice daily - You can use an extra dose of the antihistamine, if needed, for breakthrough symptoms.  - Consider nasal saline rinses 1-2 times daily to remove allergens from the nasal cavities as well as help with mucous clearance (this is especially helpful to do before the nasal sprays are given) - Strongly consider allergy shots as a means of long-term control. - Allergy shots "re-train" and "reset" the immune system to ignore environmental allergens and decrease the resulting immune response to those allergens (sneezing, itchy watery eyes, runny nose, nasal congestion, etc).    - Allergy shots improve symptoms in 75-85% of patients.   2. COPD GOLD III  - Continue to follow with Dr. Melvyn Novas. - Lung testing looks fairly stable. - Try stopping the Trelegy and restarting Breztri two puffs twice daily (to see if the dizziness gets better). - Do this for two weeks and see how things go.  - Your next appointment with Dr. Melvyn Novas is May 6th.   3. Return in about 6 months (around 08/03/2023).    Subjective:   Stephen Warren is a 67 y.o. male presenting today for follow up of  Chief Complaint  Patient presents with   Follow-up    Pt states he is doing good so far with his asthma ,eczema  have been doing well    Stephen Warren has a  history of the following: Patient Active Problem List   Diagnosis Date Noted   Former smoker 02/26/2022   Seasonal and perennial allergic rhinitis 05/18/2021   Anaphylactic shock due to adverse food reaction 05/18/2021   Upper airway cough syndrome 09/01/2020   Chronic respiratory failure with hypoxia ([place]) 05/11/2020   Morbid (severe) obesity due to excess calories ([place]) 05/11/2020   Duodenal ulcer    Symptomatic anemia 07/03/2019   Acute upper GI bleed 07/03/2019   COPD GOLD III     Depression    Panic attacks    S/P nasal septoplasty 07/01/2018   History of colonic polyps    Diverticulosis of colon without hemorrhage    Mucosal abnormality of colon    Encounter for screening colonoscopy 10/16/2015   Hemorrhoids 10/16/2015    History obtained from: chart review and patient.  Stephen Warren is a 67 y.o. male presenting for a follow up visit.  He was last seen in September 2023.  At that time, we continued with Zyrtec as well as Singulair and Flonase.  We did start him on Astelin 1 spray per nostril twice daily.  We did talk about allergy shots for long-term control.  He continue to keep shrimp in his diet.  He remains on Breztri 2 puffs twice daily as well as supplemental oxygen and albuterol nebulizers as needed.  We recommended keeping his appointments with Dr. Melvyn Novas.  Since last visit, she has done well.   Asthma/Respiratory Symptom History: He  has been doing very well. He remains on Trelegy one puff once daily. This seems to be working well for him. He reports that he has dizziness recently. He switched to Trelegy three months ago from the [facility].  He still sees Dr. Melvyn Novas. Next appointment is May 6th. He is going to try changing back to the [facility] to see whether he has the dizziness on this medication compared to the Trelegy. He has not been on prednisone. He remains stable on the oxygen at 3 LPM. He has a concentrator at home, but a tank with him today.   Allergic Rhinitis Symptom  History: Environmental allergies are going haywire.  He remains on the cetirizine and the two nose sprays. He remains on the montelukast 10mg  daily. He is open to allergy shots, but he would need to arrange transportation each time. So this would be a barrier for sure. He overall is feeling fairly good.   He is trying to get an aide to come in intermittently a few times a week. Hopefully this will help to make life easier around his home. His PCP is working on getting this approved.   Otherwise, there have been no changes to his past medical history, surgical history, family history, or social history.    Review of Systems  Constitutional: Negative.  Negative for chills, fever, malaise/fatigue and weight loss.  HENT:  Positive for congestion. Negative for ear discharge, ear pain and sinus pain.   Eyes:  Negative for pain, discharge and redness.  Respiratory:  Negative for cough, sputum production, shortness of breath and wheezing.   Cardiovascular: Negative.  Negative for chest pain and palpitations.  Gastrointestinal:  Negative for abdominal pain, constipation, diarrhea, heartburn, nausea and vomiting.  Skin: Negative.  Negative for itching and rash.  Neurological:  Positive for dizziness. Negative for headaches.  Endo/Heme/Allergies:  Positive for environmental allergies. Does not bruise/bleed easily.  All other systems reviewed and are negative.      Objective:   Blood pressure 138/80, pulse 98, temperature 98 F (36.7 C), resp. rate 20, height 6\' 1"  (1.854 m), weight (!) 305 lb (138.3 kg), SpO2 93 %. Body mass index is 40.24 kg/m.    Physical Exam Vitals reviewed.  Constitutional:      Appearance: He is well-developed. He is obese.     Comments: Very joyful. Very talkative.   HENT:     Head: Normocephalic and atraumatic.     Right Ear: Tympanic membrane, ear canal and external ear normal.     Left Ear: Tympanic membrane, ear canal and external ear normal.     Nose: No  nasal deformity, septal deviation or mucosal edema.     Right Turbinates: Enlarged, swollen and pale.     Left Turbinates: Enlarged, swollen and pale.     Right Sinus: No maxillary sinus tenderness or frontal sinus tenderness.     Left Sinus: No maxillary sinus tenderness or frontal sinus tenderness.     Comments: No nasal polyps. Clear rhinorrhea.     Mouth/Throat:     Mouth: Mucous membranes are not pale and not dry.     Pharynx: Uvula midline.  Eyes:     General: Lids are normal. Allergic shiner present.        Right eye: No discharge.        Left eye: No discharge.     Conjunctiva/sclera: Conjunctivae normal.     Right eye: Right conjunctiva is not injected. No chemosis.    Left eye:  Left conjunctiva is not injected. No chemosis.    Pupils: Pupils are equal, round, and reactive to light.  Cardiovascular:     Rate and Rhythm: Normal rate and regular rhythm.     Heart sounds: Normal heart sounds.  Pulmonary:     Effort: Pulmonary effort is normal. No tachypnea, accessory muscle usage or respiratory distress.     Breath sounds: Wheezing present. No rhonchi or rales.     Comments: Able to speak in full sentences.  Wearing oxygen.  Chest:     Chest wall: No tenderness.  Lymphadenopathy:     Cervical: No cervical adenopathy.  Skin:    General: Skin is warm.     Capillary Refill: Capillary refill takes less than 2 seconds.     Coloration: Skin is not pale.     Findings: No abrasion, erythema, petechiae or rash. Rash is not papular, urticarial or vesicular.     Comments: No eczematous lesions noted.   Neurological:     Mental Status: He is alert.  Psychiatric:        Behavior: Behavior is cooperative.      Diagnostic studies:    Spirometry: results abnormal (FEV1: 1.13/31%, FVC: 2.53/52%, FEV1/FVC: 45%).    Spirometry consistent with mixed obstructive and restrictive disease.   Allergy Studies: none       Salvatore Marvel, MD  [facility]

## 2023-02-10 NOTE — Telephone Encounter (Signed)
Can we start a PA for [name] as patient can not get it OTC. Stephen Warren has tried and failed Zyrtec, and singular. Thank you!

## 2023-02-11 ENCOUNTER — Other Ambulatory Visit (HOSPITAL_COMMUNITY): Payer: Self-pay

## 2023-02-13 IMAGING — DX DG CHEST 2V
2 series · 2 of 2 positions shown · non-contrast
Comparison: Chest radiograph March 25, 2022

CLINICAL DATA: COPD.

EXAM:
CHEST - 2 VIEW

[chest pa]
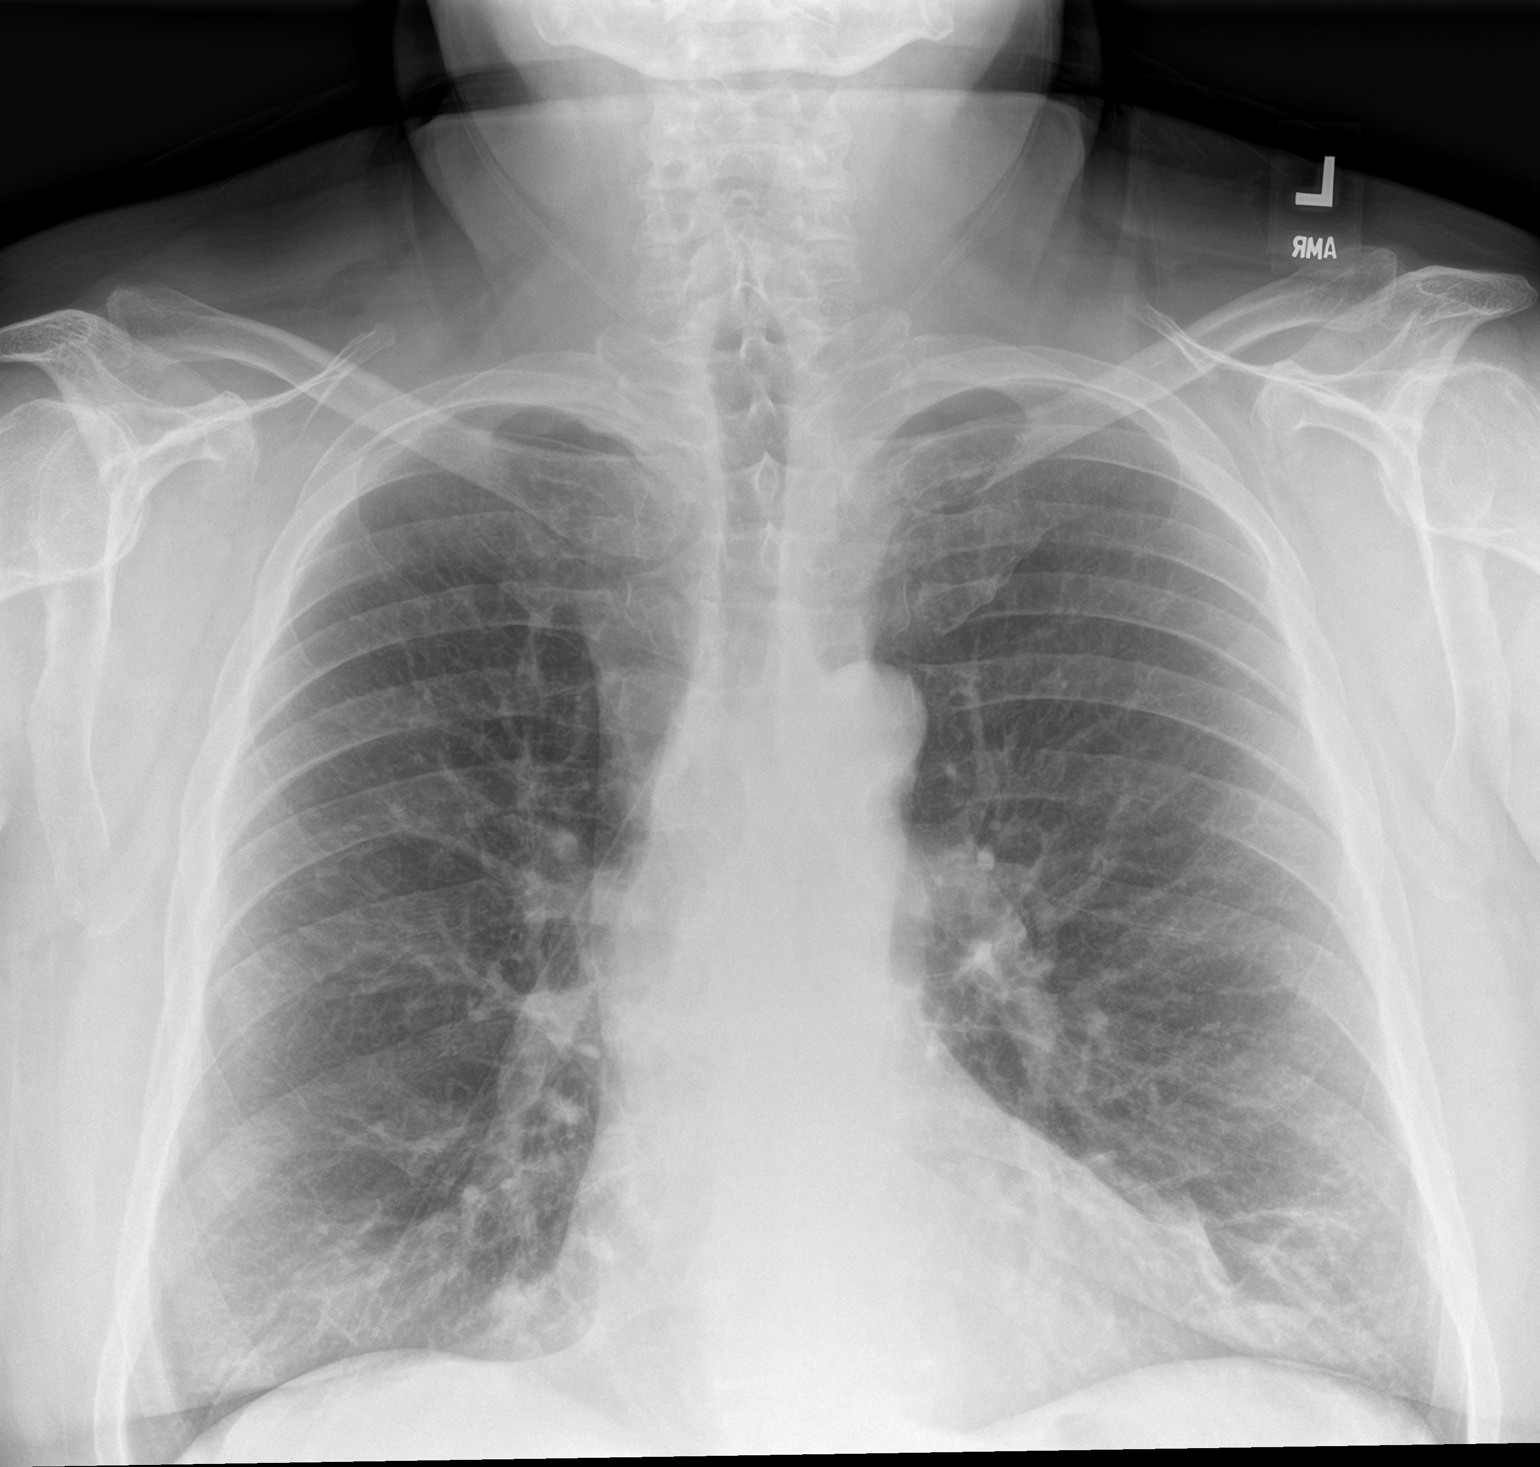

[chest lat]
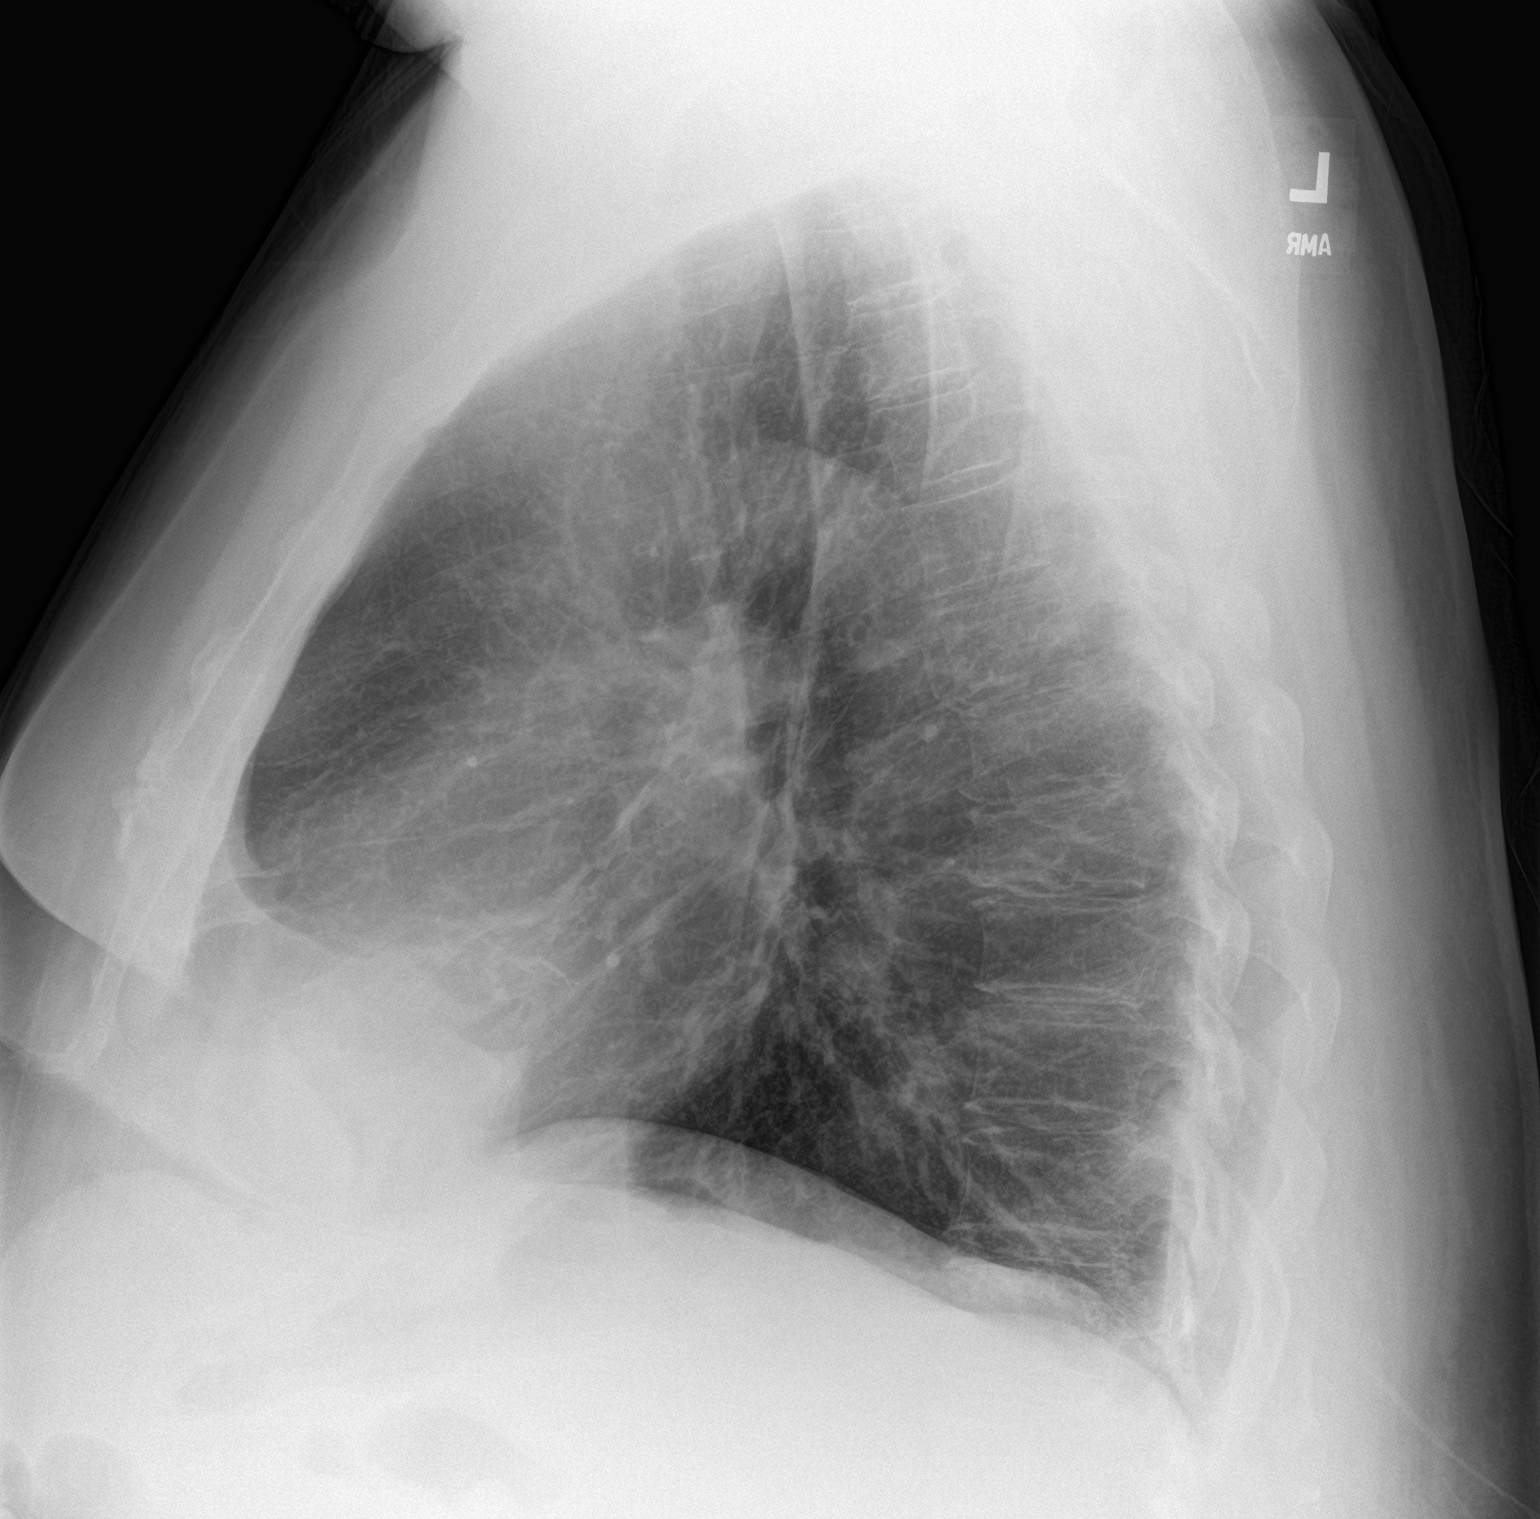

[2 of 2 positions shown; findings below may reference images not displayed]

FINDINGS: Stable cardiac and mediastinal contours. Similar bibasilar patchy
opacities. No pleural effusion or pneumothorax. Thoracic spine
degenerative changes
IMPRESSION: Stable bibasilar scarring/atelectasis.

## 2023-02-18 ENCOUNTER — Other Ambulatory Visit: Payer: Self-pay | Admitting: Allergy & Immunology

## 2023-03-17 ENCOUNTER — Ambulatory Visit: Payer: 59 | Admitting: Internal Medicine

## 2023-03-23 ENCOUNTER — Other Ambulatory Visit: Payer: Self-pay | Admitting: Internal Medicine

## 2023-03-26 DIAGNOSIS — R6889 Other general symptoms and signs: Secondary | ICD-10-CM | POA: Diagnosis not present

## 2023-03-26 DIAGNOSIS — F331 Major depressive disorder, recurrent, moderate: Secondary | ICD-10-CM | POA: Diagnosis not present

## 2023-04-08 DIAGNOSIS — I700 Atherosclerosis of aorta: Secondary | ICD-10-CM | POA: Diagnosis not present

## 2023-04-08 DIAGNOSIS — E1165 Type 2 diabetes mellitus with hyperglycemia: Secondary | ICD-10-CM | POA: Diagnosis not present

## 2023-04-08 DIAGNOSIS — M1990 Unspecified osteoarthritis, unspecified site: Secondary | ICD-10-CM | POA: Diagnosis not present

## 2023-04-08 DIAGNOSIS — J449 Chronic obstructive pulmonary disease, unspecified: Secondary | ICD-10-CM | POA: Diagnosis not present

## 2023-04-08 DIAGNOSIS — F339 Major depressive disorder, recurrent, unspecified: Secondary | ICD-10-CM | POA: Diagnosis not present

## 2023-04-08 DIAGNOSIS — R6889 Other general symptoms and signs: Secondary | ICD-10-CM | POA: Diagnosis not present

## 2023-04-08 DIAGNOSIS — K219 Gastro-esophageal reflux disease without esophagitis: Secondary | ICD-10-CM | POA: Diagnosis not present

## 2023-04-08 DIAGNOSIS — R9389 Abnormal findings on diagnostic imaging of other specified body structures: Secondary | ICD-10-CM | POA: Diagnosis not present

## 2023-04-08 DIAGNOSIS — K760 Fatty (change of) liver, not elsewhere classified: Secondary | ICD-10-CM | POA: Diagnosis not present

## 2023-04-10 DIAGNOSIS — J449 Chronic obstructive pulmonary disease, unspecified: Secondary | ICD-10-CM | POA: Diagnosis not present

## 2023-04-10 DIAGNOSIS — Z7189 Other specified counseling: Secondary | ICD-10-CM | POA: Diagnosis not present

## 2023-04-10 DIAGNOSIS — Z1331 Encounter for screening for depression: Secondary | ICD-10-CM | POA: Diagnosis not present

## 2023-04-10 DIAGNOSIS — F339 Major depressive disorder, recurrent, unspecified: Secondary | ICD-10-CM | POA: Diagnosis not present

## 2023-04-10 DIAGNOSIS — E1165 Type 2 diabetes mellitus with hyperglycemia: Secondary | ICD-10-CM | POA: Diagnosis not present

## 2023-04-10 DIAGNOSIS — Z1389 Encounter for screening for other disorder: Secondary | ICD-10-CM | POA: Diagnosis not present

## 2023-04-10 DIAGNOSIS — Z0001 Encounter for general adult medical examination with abnormal findings: Secondary | ICD-10-CM | POA: Diagnosis not present

## 2023-04-10 DIAGNOSIS — I700 Atherosclerosis of aorta: Secondary | ICD-10-CM | POA: Diagnosis not present

## 2023-05-11 DIAGNOSIS — E1165 Type 2 diabetes mellitus with hyperglycemia: Secondary | ICD-10-CM | POA: Diagnosis not present

## 2023-05-11 DIAGNOSIS — J449 Chronic obstructive pulmonary disease, unspecified: Secondary | ICD-10-CM | POA: Diagnosis not present

## 2023-05-20 ENCOUNTER — Other Ambulatory Visit: Payer: Self-pay | Admitting: Allergy & Immunology

## 2023-06-10 DIAGNOSIS — J449 Chronic obstructive pulmonary disease, unspecified: Secondary | ICD-10-CM | POA: Diagnosis not present

## 2023-06-10 DIAGNOSIS — E1165 Type 2 diabetes mellitus with hyperglycemia: Secondary | ICD-10-CM | POA: Diagnosis not present

## 2023-07-01 NOTE — Progress Notes (Unsigned)
Stephen Warren, male    DOB: October 11, 1956     MRN: [id number]   Brief patient profile:  13 yowm MM/quit smoking 2019  with h/o asthma all his life could never play sports using inhalers and nebs daily and worse 2006 met criteria for disability 2012 and arrived in [place] since 2014  And breathing worse even when quit smoking at wt  89 and followed by Stephen Warren so referred to pulmonary clinic 05/11/2020 by Dr  Stephen Warren     History of Present Illness  05/11/2020  Pulmonary/ 1st office eval/Stephen Warren on symbicort and spiriva Chief Complaint  Patient presents with   Pulmonary Consult    Referred by Dr Stephen Warren. Former Dr Stephen Warren pt.    Dyspnea:  Able to do 10 - 15 minutes in cooler air Cough: non Sleep: in recliner 30 degrees  SABA use: using hfa and neb 4 x daily  02 prn daytime  Up to 4lpm pulsed and sometimes checks sats and finds them usually 88 or above but not usually checking with ex  rec We will order Overnight pulse oximetry on Room air and let you know how you did  Make sure you check your oxygen saturations at highest level of activity Plan A = Automatic = Always=    breztri Take 2 puffs first thing in am and then another 2 puffs about 12 hours later.  Work on inhaler technique:  Plan B = Backup (to supplement plan A, not to replace it) Only use your albuterol inhaler as a rescue medication Plan C = Crisis (instead of Plan B but only if Plan B stops working) - only use your albuterol nebulizer if you first try Plan B Try albuterol 15 min before an activity that you know would make you short of breath and see if it makes any difference and if makes none then don't take it after activity unless you can't catch your breath.        08/31/2020  f/u ov/[place] office/Stephen Warren re: GOLD III / breztri maint/ 02 dep hs and ex  Chief Complaint  Patient presents with   Follow-up    Breathing has been worse for the past month. He is SOB walking accross the room.   Dyspnea:  In cool weather does better  still not having to stop walking every 1.5 aisle  150 ft to mb is flat stop 3/4  never checks 02 - doesn't use it either unless "gives out"  Cough:  Dry cough daytime  Sleeping: on side bed flat/ 2 pillows  SABA use: using albuterol hfa qid  02: sitting still no 02  /2 liters at hs and pulsing on 2lpm with ex but not monitoring as rec  Worse for the last month with sneezing/ dry cough daytime  rec GERD diet   Protonix 40 mg Take 30-60 min before first meal of the day  Prednisone 10 mg take  4 each am x 2 days,   2 each am x 2 days,  1 each am x 2 days and stop (don't take until after blood test) Try albuterol 15 min before an activity that you know would make you short of breath      Make sure you check your oxygen saturations at highest level of activity to be sure it stays over 90%   - Allergy profile 08/31/2020 >  Eos 0.2 /  IgE  1326 - Alpha 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 Using 3.5LO2  Dyspnea:  mb and back x 150 ft x flat with rollator and 02 x 3lpm but not checking  Cough: minimal attributes to pnds Sleeping: flat bed, 3 pillows  SABA use: using neb each am  instead of breztri / hfa for [employer]  02: 3lpm hs wakes up feeling good no ha or hypersomnolence or head fog Rec Plan A = Automatic = Always=    Breztri or Trelegy  Work on inhaler technique:  Plan B = Backup (to supplement plan A, not to replace it) Only use your albuterol inhaler as a rescue medication  Plan C = Crisis (instead of Plan B but only if Plan B stops working) - only use your albuterol nebulizer if you first try Plan B  My office will be contacting you by phone for referral for Overnight 02 sats on RA  Make sure you check your oxygen saturation  AT  your highest level of activity (not after you stop)   to  be sure it stays over 90%  -  08/30/22 ONO RA desats x 1 h 46 min < 89%    -  09/16/22  ONO on 2lpm with < 5 min at less than 89% so no changes needed    12/10/2022  f/u ov/[place] office/Stephen Warren re: GOLD 3  maint on trelegy   Chief Complaint  Patient presents with   Follow-up    Patient feels breathing is about the same  Had a fall yesterday  Dyspnea:  mb and back with rollator and 02 3 lpm but not checking sats Cough: minimal  Sleeping: flat bed 3 pillows  SABA use: 4 x a day /nebulizer also about the same, not following ABC instructions or ever prechallenging  02: 2lpm at hs and up to  3 lpm  Rec Also  Ok to try albuterol 15 min before an activity (on alternating days between your inhaler and nebulizer and nothing )  that you know would usually make you short of breath and see if it makes any difference and if makes none then don't take albuterol after activity unless you can't catch your breath as this means it's the resting that helps, not the albuterol. Make sure you check your oxygen saturation  AT  your highest level of activity (not after you stop)   to be sure it stays over 90%    07/02/2023  f/u ov/[place] office/Stephen Warren re: GOLDD 3 02 dep maint on trelegy 100   Chief Complaint  Patient presents with   COPD    Gold 3  Dyspnea:  mb and back with rollator maybe a quarter mile flat NOT checking sats  Cough: none / some pnds/ sneezing  Sleeping: flat bed/ 3 pillows s resp cc  SABA use:  the same as on trelegy  02: 2lpm and 2.5 walking but not really titrating  Lung cancer screening: Jan 2024    No obvious day to day or daytime variability or assoc excess/ purulent sputum or mucus plugs or hemoptysis or cp or chest tightness, subjective wheeze or overt sinus or hb symptoms.    Also denies any obvious fluctuation of symptoms with weather or environmental changes or other aggravating or alleviating factors except as outlined above   No unusual exposure hx or h/o childhood  pna/ asthma or knowledge of premature birth.  Current Allergies, Complete Past Medical History, Past Surgical History, Family History, and Social History were reviewed in [employer] record.  ROS  The following are not active complaints unless bolded Hoarseness, sore throat, dysphagia, dental problems, itching, sneezing,  nasal congestion or discharge of excess mucus or purulent secretions, ear ache,   fever, chills, sweats, unintended wt loss or wt gain, classically pleuritic or exertional cp,  orthopnea pnd or arm/hand swelling  or leg swelling, presyncope, palpitations, abdominal pain, anorexia, nausea, vomiting, diarrhea  or change in bowel habits or change in bladder habits, change in stools or change in urine, dysuria, hematuria,  rash, arthralgias, visual complaints, headache, numbness, weakness or ataxia or problems with walking or coordination,  change in mood or  memory.        Current Meds  Medication Sig   ACCU-CHEK GUIDE test strip    albuterol (ACCUNEB) 1.25 MG/3ML nebulizer solution Take 1 ampule by nebulization every 6 (six) hours as needed for wheezing or shortness of breath.   albuterol (VENTOLIN HFA) 108 (90 Base) MCG/ACT inhaler INHALE 2 PUFFS BY MOUTH EVERY 4 HOURS AS NEEDED FOR WHEEZING OR SHORTNESS OF BREATH   atorvastatin (LIPITOR) 20 MG tablet    baclofen (LIORESAL) 20 MG tablet Take 20 mg by mouth daily.    celecoxib (CELEBREX) 200 MG capsule Take 1 capsule (200 mg total) by mouth 2 (two) times daily.   cetirizine (ZYRTEC) 10 MG tablet TAKE 1 TABLET BY MOUTH TWICE DAILY AS NEEDED FOR ALLERGIES.   citalopram (CELEXA) 20 MG tablet Take 20 mg by mouth daily.   EPINEPHRINE 0.3 mg/0.3 mL IJ SOAJ injection Inject one dose intramuscularly for allergic reaction. May repeat one dose if needed after 5-15 minutes. Proceed to the ER   fluticasone (FLONASE) 50 MCG/ACT nasal spray PLACE 1 SPRAY INTO BOTH NOSTRILS DAILY   furosemide (LASIX) 20 MG tablet Take 20  mg by mouth daily as needed.   metFORMIN (GLUCOPHAGE) 500 MG tablet Take 500 mg by mouth 2 (two) times daily with a meal.   methocarbamol (ROBAXIN) 500 MG tablet Take 500 mg by mouth 2 (two) times daily.   montelukast (SINGULAIR) 10 MG tablet Take 1 tablet (10 mg total) by mouth at bedtime.   pantoprazole (PROTONIX) 40 MG tablet TAKE 1 TABLET BY MOUTH DAILY 30-60 MINUTES BEFORE THE first meal of THE DAY   PROAIR HFA 108 (90 Base) MCG/ACT inhaler Inhale 1-2 puffs into the lungs 4 (four) times daily as needed for wheezing or shortness of breath.    TRADJENTA 5 MG TABS tablet Take 5 mg by mouth daily.   traZODone (DESYREL) 100 MG tablet Take two (2) tablets by mouth at bedtime   TRELEGY ELLIPTA 100-62.5-25 MCG/ACT AEPB Inhale 1 puff into the lungs daily.   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   Hemorrhoids    History of kidney stones    Insomnia    Knee pain    Panic attacks  Objective:    Wts  07/02/2023             304 12/10/2022             304 08/19/2022             308 02/25/2022             298  08/20/2021           311   03/13/2021              321  05/11/20 (!) 320 lb (145.2 kg)  09/16/19 (!) 315 lb (142.9 kg)  07/03/19 (!) 324 lb 8 oz (147.2 kg)    .Vital signs reviewed  07/02/2023  - Note at rest 02 sats  92% on RA   General appearance:    obese wm /rollator walking       HEENT : Oropharynx  clear     NECK :  without  apparent JVD/ palpable Nodes/TM    LUNGS: no acc muscle use,  Min barrel  contour chest wall with bilateral  slightly decreased bs s audible wheeze and  without cough on insp or exp maneuvers and min   Hyperresonant  to  percussion bilaterally    CV:  RRR  no s3 or murmur or increase in P2, and no edema   ABD:  quite obese soft and nontender    MS:  Nl gait/ ext warm without deformities Or obvious joint restrictions  calf tenderness, cyanosis or clubbing     SKIN: warm and dry without lesions    NEURO:  alert, approp, nl sensorium with  no motor or cerebellar deficits apparent.                   I personally reviewed images and agree with radiology impression as follows:   Chest LDSCT     11/18/22  1. Lung-RADS 2S, benign appearance or behavior. Continue annual screening with low-dose chest CT without contrast in 12 months. 2. The "S" modifier above refers to potentially clinically significant non lung cancer related findings. Specifically, there is aortic atherosclerosis, in addition to left main and three-vessel coronary artery disease. Please note that although the presence of coronary artery calcium documents the presence of coronary artery disease, the severity of this disease and any potential stenosis cannot be assessed on this non-gated CT examination. Assessment for potential risk factor modification, dietary therapy or pharmacologic therapy may be warranted, if clinically indicated. 3. Mild diffuse bronchial wall thickening with mild centrilobular and paraseptal emphysema; imaging findings suggestive of underlying COPD. 4. Hepatic steatosis.    Assessment

## 2023-07-02 ENCOUNTER — Ambulatory Visit (INDEPENDENT_AMBULATORY_CARE_PROVIDER_SITE_OTHER): Payer: Medicare HMO | Admitting: Internal Medicine

## 2023-07-02 ENCOUNTER — Encounter: Payer: Self-pay | Admitting: Internal Medicine

## 2023-07-02 ENCOUNTER — Other Ambulatory Visit: Payer: Self-pay | Admitting: Allergy & Immunology

## 2023-07-02 VITALS — BP 131/72 | HR 89 | Ht 73.0 in | Wt 304.0 lb

## 2023-07-02 DIAGNOSIS — J449 Chronic obstructive pulmonary disease, unspecified: Secondary | ICD-10-CM

## 2023-07-02 DIAGNOSIS — J9611 Chronic respiratory failure with hypoxia: Secondary | ICD-10-CM | POA: Diagnosis not present

## 2023-07-02 NOTE — Assessment & Plan Note (Signed)
Body mass index is 40.11 kg/m.    Lab Results  Component Value Date   TSH 0.646 09/05/2020      Contributing to doe and risk of GERD/dvt/obesity  >>>   reviewed the need and the process to achieve and maintain neg calorie balance > defer f/u primary care including intermittently monitoring thyroid status     Each maintenance medication was reviewed in detail including emphasizing most importantly the difference between maintenance and prns and under what circumstances the prns are to be triggered using an action plan format where appropriate.  Total time for H and P, chart review, counseling, reviewing hfa/dpi/neb/02 /oxymeter device(s) , directly observing portions of ambulatory 02 saturation study/ and generating customized AVS unique to this office visit / same day charting = 33 min

## 2023-07-02 NOTE — Assessment & Plan Note (Signed)
Quit smoking 2017  - PFT's  05/11/2020  FEV1 1.32 (33 % ) ratio 0.42  p 25 % improvement from saba p ? 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 08/31/2020    MM   Level 147  - 08/19/2022  After extensive coaching inhaler device,  effectiveness =   80% with hfa and DPI > trial of trelegy 100 at pt request    Group D (now reclassified as E) in terms of symptom/risk and laba/lama/ICS  therefore appropriate rx at this point >>>  trelegy 100 but needs more approp saba use:  Re SABA :  I spent extra time with pt today reviewing appropriate use of albuterol for prn use on exertion with the following points: 1) saba is for relief of sob that does not improve by walking a slower pace or resting but rather if the pt does not improve after trying this first. 2) If the pt is convinced, as many are, that saba helps recover from activity faster then it's easy to tell if this is the case by re-challenging : ie stop, take the inhaler, then p 5 minutes try the exact same activity (intensity of workload) that just caused the symptoms and see if they are substantially diminished or not after saba 3) if there is an activity that reproducibly causes the symptoms, try the saba 15 min before the activity on alternate days   If in fact the saba really does help, then fine to continue to use it prn but advised may need to look closer at the maintenance regimen being used to achieve better control of airways disease with exertion.

## 2023-07-02 NOTE — Patient Instructions (Addendum)
Make sure you check your oxygen saturation  AT  your highest level of activity (not after you stop)   to be sure it stays over 90% and adjust  02 flow upward to maintain this level if needed but remember to turn it back to previous settings when you stop (to conserve your supply).  Also  Ok to try albuterol 15 min before an activity (on alternating days between the inhaler, the nebulizer, then nothing )  that you know would usually make you short of breath ([place] and back)  and see if it makes any difference and if makes none then don't take albuterol after activity unless you can't catch your breath as this means it's the resting that helps, not the albuterol.   Please schedule a follow up visit in 6 months but call sooner if needed

## 2023-07-02 NOTE — Assessment & Plan Note (Signed)
Placed on 02 by [name] ? when - ONO RA 05/17/20  desat < 89% x 1 h 44 min  So rec continue noct 02 and titrate daytime to keep > 90%  -  08/31/2020   Walked RA  approx   200 ft  @ avg pace  stopped due to  Sob with sats 93% - 08/20/2021   Walked on RA x  3  lap(s) =  approx 525 ft  @  moderate pace with rollator, stopped due to sob x 2  with lowest 02 sats 96%  - 08/19/2022 patient walked 300 ft  walker assist on 3LO2 cont. Stopped after lap 2 due to being tired and SOB. Lowest sats = 94%  - 08/30/22 ONO RA desats x 1 h 46 min < 89%    -  09/16/22  ONO on 2lpm with < 5 min at less than 89% so no changes needed  - 07/02/2023  RA rest 92% then  Walked on 3lpm pulsed    x  2  lap(s) =  approx 300  ft  @ slow pace, stopped due to fatigue with lowest 02 sats 95% and no sob    Advised: Make sure you check your oxygen saturation  AT  your highest level of activity (not after you stop)   to be sure it stays over 90% and adjust  02 flow upward to maintain this level if needed but remember to turn it back to previous settings when you stop (to conserve your supply).

## 2023-07-11 DIAGNOSIS — J449 Chronic obstructive pulmonary disease, unspecified: Secondary | ICD-10-CM | POA: Diagnosis not present

## 2023-07-11 DIAGNOSIS — E1165 Type 2 diabetes mellitus with hyperglycemia: Secondary | ICD-10-CM | POA: Diagnosis not present

## 2023-08-01 ENCOUNTER — Other Ambulatory Visit: Payer: Self-pay | Admitting: Allergy & Immunology

## 2023-08-06 ENCOUNTER — Encounter: Payer: Self-pay | Admitting: Allergy & Immunology

## 2023-08-06 ENCOUNTER — Ambulatory Visit (INDEPENDENT_AMBULATORY_CARE_PROVIDER_SITE_OTHER): Payer: Medicare HMO | Admitting: Allergy & Immunology

## 2023-08-06 ENCOUNTER — Telehealth: Payer: Self-pay | Admitting: Internal Medicine

## 2023-08-06 VITALS — BP 126/62 | HR 104 | Temp 97.7°F | Resp 20

## 2023-08-06 DIAGNOSIS — J449 Chronic obstructive pulmonary disease, unspecified: Secondary | ICD-10-CM | POA: Diagnosis not present

## 2023-08-06 DIAGNOSIS — J302 Other seasonal allergic rhinitis: Secondary | ICD-10-CM | POA: Diagnosis not present

## 2023-08-06 DIAGNOSIS — J3089 Other allergic rhinitis: Secondary | ICD-10-CM

## 2023-08-06 DIAGNOSIS — T7800XD Anaphylactic reaction due to unspecified food, subsequent encounter: Secondary | ICD-10-CM

## 2023-08-06 MED ORDER — FLUTICASONE PROPIONATE 50 MCG/ACT NA SUSP: 1.0000 | Freq: Every day | NASAL | 5 refills | Status: AC

## 2023-08-06 MED ORDER — AZELASTINE HCL 0.1 % NA SOLN: 1.0000 | Freq: Two times a day (BID) | NASAL | 1 refills | Status: AC

## 2023-08-06 MED ORDER — MONTELUKAST SODIUM 10 MG PO TABS: 10.0000 mg | ORAL_TABLET | Freq: Every day | ORAL | 1 refills | Status: AC

## 2023-08-06 MED ORDER — LEVOCETIRIZINE DIHYDROCHLORIDE 5 MG PO TABS: 5.0000 mg | ORAL_TABLET | Freq: Every evening | ORAL | 5 refills | Status: DC

## 2023-08-06 NOTE — Patient Instructions (Addendum)
1. Chronic rhinitis - Previous testing showed: grasses, ragweed, weeds, trees, indoor molds, outdoor molds, and dust mites - Stop the Zyrtec and start [name] (we will send in a new prescription). - Continue taking: Singulair (montelukast) 10mg  daily, and Flonase (fluticasone) one spray per nostril daily and Astelin (azelastine) one spray per nostril up to twice daily - You can use an extra dose of the antihistamine, if needed, for breakthrough symptoms.  - Consider nasal saline rinses 1-2 times daily to remove allergens from the nasal cavities as well as help with mucous clearance (this is especially helpful to do before the nasal sprays are given) - We are going to sign for allergy shots for long term control. - Make an appointment to start in 2-3 weeks.   2. COPD GOLD III  - Continue to follow with Dr. Sherene Sires. - Lung testing looked much better today. - We are not going to make any changes at all.  - Continue with Trelegy one puff once daily.   3. Return in about 4 months (around 12/06/2023).    Please inform us of any Emergency Department visits, hospitalizations, or changes in symptoms. Call us before going to the ED for breathing or allergy symptoms since we might be able to fit you in for a sick visit. Feel free to contact us anytime with any questions, problems, or concerns.  It was a pleasure to see you again today!  Websites that have reliable patient information: 1. American Academy of Asthma, Allergy, and Immunology: www.aaaai.org 2. Food Allergy Research and Education (FARE): foodallergy.org 3. Mothers of Asthmatics: http://www.asthmacommunitynetwork.org 4. American College of Allergy, Asthma, and Immunology: www.acaai.org   COVID-19 Vaccine Information can be found at: PodExchange.nl For questions related to vaccine distribution or appointments, please email [email or web address] or call [phone].   We realize  that you might be concerned about having an allergic reaction to the COVID19 vaccines. To help with that concern, WE ARE OFFERING THE COVID19 VACCINES IN OUR OFFICE! Ask the front desk for dates!     "Like" [place] on Facebook and Instagram for our latest updates!      A healthy democracy works best when [employer] participate! Make sure you are registered to vote! If you have moved or changed any of your contact information, you will need to get this updated before voting!  In some cases, you MAY be able to register to vote online: [email or web address]

## 2023-08-06 NOTE — Telephone Encounter (Signed)
PCC's did not call the patient

## 2023-08-06 NOTE — Telephone Encounter (Signed)
Closed this message in error

## 2023-08-06 NOTE — Progress Notes (Signed)
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 Sherene Sires    Plan/Recommendations:   1. Chronic rhinitis - Previous testing showed: grasses, ragweed, weeds, trees, indoor molds, outdoor molds, and dust mites - Stop the Zyrtec and start [name] (we will send in a new prescription). - Continue taking: Singulair (montelukast) 10mg  daily, and Flonase (fluticasone) one spray per nostril daily and Astelin (azelastine) one spray per nostril up to twice daily - You can use an extra dose of the antihistamine, if needed, for breakthrough symptoms.  - Consider nasal saline rinses 1-2 times daily to remove allergens from the nasal cavities as well as help with mucous clearance (this is especially helpful to do before the nasal sprays are given) - We are going to sign for allergy shots for long term control. - Make an appointment to start in 2-3 weeks.   2. COPD GOLD III  - Continue to follow with Dr. Sherene Sires. - Lung testing looked much better today. - We are not going to make any changes at all.  - Continue with Trelegy one puff once daily.   3. Return in about 4 months (around 12/06/2023).    Subjective:   Stephen Warren is a 67 y.o. male presenting today for follow up of  Chief Complaint  Patient presents with   Follow-up    Stephen Warren has a history of the following: Patient Active Problem List   Diagnosis Date Noted   Former smoker 02/26/2022   Seasonal and perennial allergic rhinitis 05/18/2021   Anaphylactic shock due to adverse food reaction 05/18/2021   Upper airway cough syndrome 09/01/2020   Chronic respiratory failure with hypoxia (HCC) 05/11/2020   Morbid (severe) obesity due to excess calories (HCC)  05/11/2020   Duodenal ulcer    Symptomatic anemia 07/03/2019   Acute upper GI bleed 07/03/2019   COPD GOLD III     Depression    Panic attacks    S/P nasal septoplasty 07/01/2018   History of colonic polyps    Diverticulosis of colon without hemorrhage    Mucosal abnormality of colon    Encounter for screening colonoscopy 10/16/2015   Hemorrhoids 10/16/2015    History obtained from: chart review and patient.  Stephen Warren is a 67 y.o. male presenting for a follow up visit.  He was last seen in March 2024.  At that time, the was doing well on Singulair as well as Flonase and Astelin.  We stopped his Zyrtec and started [name] instead.  We did talk about allergy shots.  His COPD was under good control with Dr. Work.  We recommended restarting Breztri 2 puffs twice daily to see if the dizziness got any better.  Since last visit, he has largely done well.   Asthma/Respiratory Symptom History: He does have worsening symptoms when he bends over.  He is still seeing Dr. Sherene Sires. He remains on oxygen throughout the day. He is on 3 Lduring the day and remains on the same dose at night. He thinks that Dr. Sherene Sires might be "cutting [him] off". He remains on the Trelegy one puff once daily. This seems to be working well to control his symptoms. He has not been to the hospital for his breathing. He manages  this at home without a problem. He just feels that the Trelegy is better for his schedule - once daily is very good for her.   Allergic Rhinitis Symptom History: Allergic rhinitis is controlled with montelukast and levocetirizine. He is open to doing it twice daily. He has the nose sprays and uses this as needed. This is mostly helpful when he is starting to get congested and when they act up. But he does not use them every day.  He is open to doing allergy shots. He does have free transportation that can take him to and from his apartment.   He is doing well in his apartment. He is surrounded by a number of people  that have been there for a long period of time. There are some more transient folks. In fact, he pretends to be anti-social when someone new moves in just in case they turn out to be a mooch and ask him for things all of the time. Once they are there for a longer period of time, he will work harder on getting to know them better.   Otherwise, there have been no changes to his past medical history, surgical history, family history, or social history.    Review of systems otherwise negative other than that mentioned in the HPI.    Objective:   Blood pressure 126/62, pulse (!) 104, temperature 97.7 F (36.5 C), resp. rate 20, SpO2 92%. There is no height or weight on file to calculate BMI.    Physical Exam Vitals reviewed.  Constitutional:      Appearance: He is well-developed.  HENT:     Head: Normocephalic and atraumatic.     Right Ear: Tympanic membrane, ear canal and external ear normal. No drainage, swelling or tenderness. Tympanic membrane is not injected, scarred, erythematous, retracted or bulging.     Left Ear: Tympanic membrane, ear canal and external ear normal. No drainage, swelling or tenderness. Tympanic membrane is not injected, scarred, erythematous, retracted or bulging.     Nose: No nasal deformity, septal deviation, mucosal edema or rhinorrhea.     Right Turbinates: Enlarged, swollen and pale.     Left Turbinates: Enlarged, swollen and pale.     Right Sinus: No maxillary sinus tenderness or frontal sinus tenderness.     Left Sinus: No maxillary sinus tenderness or frontal sinus tenderness.     Mouth/Throat:     Mouth: Mucous membranes are not pale and not dry.     Pharynx: Uvula midline.  Eyes:     General: Lids are normal. Allergic shiner present.        Right eye: No discharge.        Left eye: No discharge.     Conjunctiva/sclera: Conjunctivae normal.     Right eye: Right conjunctiva is not injected. No chemosis.    Left eye: Left conjunctiva is not  injected. No chemosis.    Pupils: Pupils are equal, round, and reactive to light.  Cardiovascular:     Rate and Rhythm: Normal rate and regular rhythm.     Heart sounds: Normal heart sounds.  Pulmonary:     Effort: Pulmonary effort is normal. No tachypnea, accessory muscle usage or respiratory distress.     Breath sounds: Normal breath sounds. No wheezing, rhonchi or rales.  Chest:     Chest wall: No tenderness.  Abdominal:     Tenderness: There is no abdominal tenderness. There is no guarding or rebound.  Lymphadenopathy:     Head:  Right side of head: No submandibular, tonsillar or occipital adenopathy.     Left side of head: No submandibular, tonsillar or occipital adenopathy.     Cervical: No cervical adenopathy.  Skin:    Coloration: Skin is not pale.     Findings: No abrasion, erythema, petechiae or rash. Rash is not papular, urticarial or vesicular.  Neurological:     Mental Status: He is alert.  Psychiatric:        Behavior: Behavior is cooperative.      Diagnostic studies:    Spirometry: results abnormal (FEV1: 4.00/83%, FVC: 47%, FEV1/FVC: 47%).    Spirometry consistent with moderate obstructive disease. Xopenex four puffs via MDI treatment given in clinic with no improvement.  Allergy Studies:  none           Malachi Bonds, MD  [facility]

## 2023-08-06 NOTE — Telephone Encounter (Signed)
PT got a call he needs to set up ONO. Pls call @ [phone]. Please leave a message he is getting a lot of message.

## 2023-08-08 NOTE — Telephone Encounter (Signed)
Left detailed message that call did not come from [facility].

## 2023-08-11 DIAGNOSIS — E1165 Type 2 diabetes mellitus with hyperglycemia: Secondary | ICD-10-CM | POA: Diagnosis not present

## 2023-08-11 DIAGNOSIS — J449 Chronic obstructive pulmonary disease, unspecified: Secondary | ICD-10-CM | POA: Diagnosis not present

## 2023-08-11 NOTE — Addendum Note (Signed)
Addended by: Alfonse Spruce on: 08/11/2023 04:21 PM   Modules accepted: Orders

## 2023-08-12 DIAGNOSIS — J301 Allergic rhinitis due to pollen: Secondary | ICD-10-CM | POA: Diagnosis not present

## 2023-08-12 NOTE — Progress Notes (Signed)
Aeroallergen Immunotherapy  Ordering Provider: Dr. Malachi Bonds  Patient Details Name: Stephen Warren MRN: [id number] Date of Birth: Jan 27, 1956  Order 2 of 2  Vial Label: Molds  0.2 ml (Volume)  1:20 Concentration -- Alternaria alternata 0.2 ml (Volume)  1:20 Concentration -- Cladosporium herbarum 0.2 ml (Volume)  1:10 Concentration -- Aspergillus mix 0.2 ml (Volume)  1:10 Concentration -- Penicillium mix 0.2 ml (Volume)  1:20 Concentration -- Bipolaris sorokiniana 0.2 ml (Volume)  1:20 Concentration -- Drechslera spicifera 0.2 ml (Volume)  1:10 Concentration -- Mucor plumbeus 0.2 ml (Volume)  1:10 Concentration -- Fusarium moniliforme 0.2 ml (Volume)  1:40 Concentration -- Aureobasidium pullulans 0.2 ml (Volume)  1:10 Concentration -- Rhizopus oryzae   2.0  ml Extract Subtotal 3.0  ml Diluent 5.0  ml Maintenance Total  Schedule:  A  Blue Vial (1:100,000): Schedule A (10 doses) Yellow Vial (1:10,000): Schedule A (10 doses) Green Vial (1:1,000): Schedule A (10 doses) Red Vial (1:100): Schedule A (14 doses)  Special Instructions: After completion of the first Red Vial, please space to every two weeks. After completion of the second Red Vial, please space to every 4 weeks. Ok to up dose new vials at 0.76mL --> 0.3 mL --> 0.5 mL. Ok to come twice weekly, if desired, as long as there is 48 hours between injections.

## 2023-08-12 NOTE — Progress Notes (Signed)
VIALS EXP 08-11-24

## 2023-08-12 NOTE — Progress Notes (Signed)
Aeroallergen Immunotherapy   Ordering Provider: Dr. Malachi Bonds   Patient Details  Name: Stephen Warren  MRN: [id number]  Date of Birth: 1955-12-15   Order 1 of 2   Vial Label: G/W/RW/T   0.3 ml (Volume)  BAU Concentration -- 7 Grass Mix* 100,000 ([address] [place], [place], [place], [place]ial [place], [place], Sweet [place], [place])  0.3 ml (Volume)  1:20 Concentration -- Ragweed Mix  0.5 ml (Volume)  1:20 Concentration -- Weed Mix*  0.5 ml (Volume)  1:20 Concentration -- Eastern 10 Tree Mix (also Sweet Gum)    1.6  ml Extract Subtotal  3.4  ml Diluent  5.0  ml Maintenance Total   Schedule:  A   Blue Vial (1:100,000): Schedule A (10 doses)  Yellow Vial (1:10,000): Schedule A (10 doses)  Green Vial (1:1,000): Schedule A (10 doses)  Red Vial (1:100): Schedule A (14 doses)   Special Instructions: After completion of the first Red Vial, please space to every two weeks. After completion of the second Red Vial, please space to every 4 weeks. Ok to up dose new vials at 0.32mL --> 0.3 mL --> 0.5 mL. Ok to come twice weekly, if desired, as long as there is 48 hours between injections.

## 2023-08-13 DIAGNOSIS — J302 Other seasonal allergic rhinitis: Secondary | ICD-10-CM | POA: Diagnosis not present

## 2023-08-27 ENCOUNTER — Ambulatory Visit (INDEPENDENT_AMBULATORY_CARE_PROVIDER_SITE_OTHER): Payer: Medicare HMO

## 2023-08-27 DIAGNOSIS — J309 Allergic rhinitis, unspecified: Secondary | ICD-10-CM | POA: Diagnosis not present

## 2023-08-27 NOTE — Progress Notes (Signed)
Immunotherapy   Patient Details  Name: Ossiel Marchio MRN: [id number] Date of Birth: 08-06-1956  08/27/2023  Cleatis Polka started injections for  molds, ragweed's, grasses, weeds, and trees.  Following schedule: A  Frequency:2 times per week Epi-Pen:Epi-Pen Available  Consent signed previously and patient instructions given. Patient sat in the lobby for thirty minutes without an issue.    Ralene Muskrat 08/27/2023, 9:29 AM

## 2023-09-03 ENCOUNTER — Ambulatory Visit (INDEPENDENT_AMBULATORY_CARE_PROVIDER_SITE_OTHER): Payer: Self-pay

## 2023-09-03 DIAGNOSIS — J309 Allergic rhinitis, unspecified: Secondary | ICD-10-CM

## 2023-09-10 ENCOUNTER — Ambulatory Visit (INDEPENDENT_AMBULATORY_CARE_PROVIDER_SITE_OTHER): Payer: Self-pay

## 2023-09-10 DIAGNOSIS — J309 Allergic rhinitis, unspecified: Secondary | ICD-10-CM | POA: Diagnosis not present

## 2023-09-10 DIAGNOSIS — F331 Major depressive disorder, recurrent, moderate: Secondary | ICD-10-CM | POA: Diagnosis not present

## 2023-09-10 DIAGNOSIS — J449 Chronic obstructive pulmonary disease, unspecified: Secondary | ICD-10-CM | POA: Diagnosis not present

## 2023-09-10 DIAGNOSIS — E1165 Type 2 diabetes mellitus with hyperglycemia: Secondary | ICD-10-CM | POA: Diagnosis not present

## 2023-09-12 ENCOUNTER — Ambulatory Visit (INDEPENDENT_AMBULATORY_CARE_PROVIDER_SITE_OTHER): Payer: Medicare HMO

## 2023-09-12 DIAGNOSIS — J309 Allergic rhinitis, unspecified: Secondary | ICD-10-CM | POA: Diagnosis not present

## 2023-09-19 ENCOUNTER — Ambulatory Visit (INDEPENDENT_AMBULATORY_CARE_PROVIDER_SITE_OTHER): Payer: Medicare HMO

## 2023-09-19 DIAGNOSIS — J309 Allergic rhinitis, unspecified: Secondary | ICD-10-CM | POA: Diagnosis not present

## 2023-09-24 ENCOUNTER — Ambulatory Visit (INDEPENDENT_AMBULATORY_CARE_PROVIDER_SITE_OTHER): Payer: Self-pay

## 2023-09-24 DIAGNOSIS — J309 Allergic rhinitis, unspecified: Secondary | ICD-10-CM

## 2023-09-30 ENCOUNTER — Other Ambulatory Visit: Payer: Self-pay | Admitting: Acute Care

## 2023-09-30 DIAGNOSIS — Z122 Encounter for screening for malignant neoplasm of respiratory organs: Secondary | ICD-10-CM

## 2023-09-30 DIAGNOSIS — Z87891 Personal history of nicotine dependence: Secondary | ICD-10-CM

## 2023-10-01 ENCOUNTER — Ambulatory Visit (INDEPENDENT_AMBULATORY_CARE_PROVIDER_SITE_OTHER): Payer: Medicare HMO

## 2023-10-01 DIAGNOSIS — J309 Allergic rhinitis, unspecified: Secondary | ICD-10-CM | POA: Diagnosis not present

## 2023-10-03 ENCOUNTER — Ambulatory Visit (INDEPENDENT_AMBULATORY_CARE_PROVIDER_SITE_OTHER): Payer: Medicare HMO

## 2023-10-03 DIAGNOSIS — J309 Allergic rhinitis, unspecified: Secondary | ICD-10-CM | POA: Diagnosis not present

## 2023-10-06 ENCOUNTER — Ambulatory Visit (INDEPENDENT_AMBULATORY_CARE_PROVIDER_SITE_OTHER): Payer: Medicare HMO

## 2023-10-06 DIAGNOSIS — J309 Allergic rhinitis, unspecified: Secondary | ICD-10-CM

## 2023-10-11 DIAGNOSIS — E1165 Type 2 diabetes mellitus with hyperglycemia: Secondary | ICD-10-CM | POA: Diagnosis not present

## 2023-10-11 DIAGNOSIS — J449 Chronic obstructive pulmonary disease, unspecified: Secondary | ICD-10-CM | POA: Diagnosis not present

## 2023-10-29 DIAGNOSIS — K219 Gastro-esophageal reflux disease without esophagitis: Secondary | ICD-10-CM | POA: Diagnosis not present

## 2023-10-29 DIAGNOSIS — J449 Chronic obstructive pulmonary disease, unspecified: Secondary | ICD-10-CM | POA: Diagnosis not present

## 2023-10-29 DIAGNOSIS — F339 Major depressive disorder, recurrent, unspecified: Secondary | ICD-10-CM | POA: Diagnosis not present

## 2023-10-29 DIAGNOSIS — Z9981 Dependence on supplemental oxygen: Secondary | ICD-10-CM | POA: Diagnosis not present

## 2023-10-29 DIAGNOSIS — E1165 Type 2 diabetes mellitus with hyperglycemia: Secondary | ICD-10-CM | POA: Diagnosis not present

## 2023-11-03 ENCOUNTER — Ambulatory Visit (INDEPENDENT_AMBULATORY_CARE_PROVIDER_SITE_OTHER): Payer: Self-pay

## 2023-11-03 DIAGNOSIS — J309 Allergic rhinitis, unspecified: Secondary | ICD-10-CM | POA: Diagnosis not present

## 2023-11-19 ENCOUNTER — Ambulatory Visit (INDEPENDENT_AMBULATORY_CARE_PROVIDER_SITE_OTHER): Payer: Medicare HMO

## 2023-11-19 DIAGNOSIS — J309 Allergic rhinitis, unspecified: Secondary | ICD-10-CM

## 2023-11-21 ENCOUNTER — Ambulatory Visit (HOSPITAL_COMMUNITY)
Admission: RE | Admit: 2023-11-21 | Discharge: 2023-11-21 | Disposition: A | Discharge status: Home / Self Care | Payer: Medicare HMO | Source: Ambulatory Visit | Attending: Internal Medicine | Admitting: Internal Medicine

## 2023-11-21 ENCOUNTER — Ambulatory Visit (INDEPENDENT_AMBULATORY_CARE_PROVIDER_SITE_OTHER): Payer: Medicare HMO

## 2023-11-21 DIAGNOSIS — J309 Allergic rhinitis, unspecified: Secondary | ICD-10-CM

## 2023-11-21 DIAGNOSIS — Z87891 Personal history of nicotine dependence: Secondary | ICD-10-CM | POA: Insufficient documentation

## 2023-11-21 DIAGNOSIS — Z122 Encounter for screening for malignant neoplasm of respiratory organs: Secondary | ICD-10-CM | POA: Diagnosis not present

## 2023-11-26 ENCOUNTER — Ambulatory Visit (INDEPENDENT_AMBULATORY_CARE_PROVIDER_SITE_OTHER): Payer: Self-pay

## 2023-11-26 DIAGNOSIS — J309 Allergic rhinitis, unspecified: Secondary | ICD-10-CM | POA: Diagnosis not present

## 2023-11-28 ENCOUNTER — Ambulatory Visit (INDEPENDENT_AMBULATORY_CARE_PROVIDER_SITE_OTHER): Payer: Self-pay

## 2023-11-28 DIAGNOSIS — J309 Allergic rhinitis, unspecified: Secondary | ICD-10-CM

## 2023-11-29 DIAGNOSIS — J449 Chronic obstructive pulmonary disease, unspecified: Secondary | ICD-10-CM | POA: Diagnosis not present

## 2023-11-29 DIAGNOSIS — E1165 Type 2 diabetes mellitus with hyperglycemia: Secondary | ICD-10-CM | POA: Diagnosis not present

## 2023-12-01 ENCOUNTER — Other Ambulatory Visit: Payer: Self-pay | Admitting: Acute Care

## 2023-12-01 DIAGNOSIS — Z122 Encounter for screening for malignant neoplasm of respiratory organs: Secondary | ICD-10-CM

## 2023-12-01 DIAGNOSIS — Z87891 Personal history of nicotine dependence: Secondary | ICD-10-CM

## 2023-12-05 ENCOUNTER — Ambulatory Visit (INDEPENDENT_AMBULATORY_CARE_PROVIDER_SITE_OTHER): Payer: Self-pay

## 2023-12-05 DIAGNOSIS — J309 Allergic rhinitis, unspecified: Secondary | ICD-10-CM | POA: Diagnosis not present

## 2023-12-10 ENCOUNTER — Ambulatory Visit (INDEPENDENT_AMBULATORY_CARE_PROVIDER_SITE_OTHER): Payer: Self-pay | Admitting: *Deleted

## 2023-12-10 DIAGNOSIS — J309 Allergic rhinitis, unspecified: Secondary | ICD-10-CM | POA: Diagnosis not present

## 2023-12-12 ENCOUNTER — Other Ambulatory Visit: Payer: Self-pay

## 2023-12-12 ENCOUNTER — Ambulatory Visit (INDEPENDENT_AMBULATORY_CARE_PROVIDER_SITE_OTHER): Payer: Medicare HMO | Admitting: Allergy & Immunology

## 2023-12-12 ENCOUNTER — Encounter: Payer: Self-pay | Admitting: Allergy & Immunology

## 2023-12-12 VITALS — BP 134/82 | HR 95 | Temp 97.8°F | Resp 16 | Ht 72.05 in | Wt 301.8 lb

## 2023-12-12 DIAGNOSIS — J3089 Other allergic rhinitis: Secondary | ICD-10-CM | POA: Diagnosis not present

## 2023-12-12 DIAGNOSIS — J302 Other seasonal allergic rhinitis: Secondary | ICD-10-CM

## 2023-12-12 DIAGNOSIS — J449 Chronic obstructive pulmonary disease, unspecified: Secondary | ICD-10-CM

## 2023-12-12 NOTE — Progress Notes (Signed)
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 Sherene Sires  Plan/Recommendations:   1. Chronic rhinitis - Previous testing showed: grasses, ragweed, weeds, trees, indoor molds, outdoor molds, and dust mites - Continue with allergy shots at the same schedule.  - You are slowly getting up there!  - Continue taking: Singulair (montelukast) 10mg  daily, [name] (levocetirizine) 5mg  daily, and Flonase (fluticasone) one spray per nostril daily and Astelin (azelastine) one spray per nostril up to twice daily - You can use an extra dose of the antihistamine, if needed, for breakthrough symptoms.  - Consider nasal saline rinses 1-2 times daily to remove allergens from the nasal cavities as well as help with mucous clearance (this is especially helpful to do before the nasal sprays are given)  2. COPD GOLD III  - Continue to follow with Dr. Sherene Sires. - Lung testing looked much better today. - We are not going to make any changes at all.  - Continue with Trelegy one puff once daily.  - I am going to send my note to Dr. Sherene Sires to keep him in the loop.   3. Return in about 1 year (around 12/11/2024).     Subjective:   Stephen Warren is a 68 y.o. male presenting today for follow up of  Chief Complaint  Patient presents with   Follow-up    Change allergy medicine    Stephen Warren has a history of the following: Patient Active Problem List   Diagnosis Date Noted   Former smoker 02/26/2022   Seasonal and perennial allergic rhinitis 05/18/2021   Anaphylactic shock due to adverse food reaction 05/18/2021   Upper airway cough syndrome 09/01/2020   Chronic respiratory failure with hypoxia (HCC) 05/11/2020   Morbid (severe) obesity due  to excess calories (HCC) 05/11/2020   Duodenal ulcer    Symptomatic anemia 07/03/2019   Acute upper GI bleed 07/03/2019   COPD GOLD III     Depression    Panic attacks    S/P nasal septoplasty 07/01/2018   History of colonic polyps    Diverticulosis of colon without hemorrhage    Mucosal abnormality of colon    Encounter for screening colonoscopy 10/16/2015   Hemorrhoids 10/16/2015    History obtained from: chart review and patient.  Discussed the use of AI scribe software for clinical note transcription with the patient and/or guardian, who gave verbal consent to proceed.  Stephen Warren is a 68 y.o. male presenting for a follow up visit.  He was last seen in September 2024.  At that time, he was continued on Singulair as well as Flonase and Astelin.  He did decide to start allergy shots.  We also started [name] and lieu of Zyrtec to see if this helps.  His COPD was stable.  Lung testing looked better.  We continue with Trelegy 1 puff once daily.  He continue to follow with Dr. Sherene Sires.  Since last visit, he has largely done well.  Asthma/Respiratory Symptom History: He is also on Trelegy, although there is some confusion about his current medication list, which he has brought for review to update his records. He has not been using albuterol much at all.   Allergic Rhinitis Symptom History: He is undergoing allergy  shots and reports no issues with itching around the injection sites. He is currently progressing through his vials and is almost in his second vial. He is feeling like they are working well. He is not having any large local reactions. He is taking [name] (levocetirizine) for allergies, sometimes twice a day, to manage symptoms such as a runny nose, which occurs even after using nasal sprays. He has not had any recent sinus infections have been experienced.   Stephen Warren is on allergen immunotherapy. He receives two injections. Immunotherapy script #1 contains  ragweed, trees, weeds, and  grasses. He currently receives 0.43mL of the BLUE vial (1/100,000). Immunotherapy script #2 contains molds. He currently receives 0.77mL of the BLUE vial (1/100,000). He started shots October of 2024 and not yet reached maintenance.   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 He recently started dating one of his neighbors. They seem to be getting along very well.  Otherwise, there have been no changes to his past medical history, surgical history, family history, or social history.    Review of systems otherwise negative other than that mentioned in the HPI.    Objective:   Blood pressure 134/82, pulse 95, temperature 97.8 F (36.6 C), temperature source Temporal, resp. rate 16, height 6' 0.05" (1.83 m), weight (!) 301 lb 12.8 oz (136.9 kg), SpO2 96%. Body mass index is 40.88 kg/m.    Physical Exam Vitals reviewed.  Constitutional:      Appearance: He is well-developed.  HENT:     Head: Normocephalic and atraumatic.     Right Ear: Tympanic membrane, ear canal and external ear normal. No drainage, swelling or tenderness. Tympanic membrane is not injected, scarred, erythematous, retracted or bulging.     Left Ear: Tympanic membrane, ear canal and external ear normal. No drainage, swelling or tenderness. Tympanic membrane is not injected, scarred, erythematous, retracted or bulging.     Nose: No nasal deformity, septal deviation, mucosal edema or rhinorrhea.     Right Turbinates: Enlarged, swollen and pale.     Left Turbinates: Enlarged, swollen and pale.     Right Sinus: No maxillary sinus tenderness or frontal sinus tenderness.     Left Sinus: No maxillary sinus tenderness or frontal sinus tenderness.     Comments: No nasal polyps noted.     Mouth/Throat:     Lips: Pink.     Mouth: Mucous membranes are moist. Mucous membranes are not pale and not dry.     Pharynx: Uvula midline.     Comments: No cobblestoning present.  Eyes:     General: Lids are normal.  Allergic shiner present.        Right eye: No discharge.        Left eye: No discharge.     Conjunctiva/sclera: Conjunctivae normal.     Right eye: Right conjunctiva is not injected. No chemosis.    Left eye: Left conjunctiva is not injected. No chemosis.    Pupils: Pupils are equal, round, and reactive to light.  Cardiovascular:     Rate and Rhythm: Normal rate and regular rhythm.     Heart sounds: Normal heart sounds.  Pulmonary:     Effort: Pulmonary effort is normal. No tachypnea, accessory muscle usage or respiratory distress.     Breath sounds: Normal breath sounds. No wheezing, rhonchi or rales.     Comments: Moving air well in all lung fields. No increased wok of breathing noted.  Chest:     Chest wall:  No tenderness.  Abdominal:     Tenderness: There is no abdominal tenderness. There is no guarding or rebound.  Lymphadenopathy:     Head:     Right side of head: No submandibular, tonsillar or occipital adenopathy.     Left side of head: No submandibular, tonsillar or occipital adenopathy.     Cervical: No cervical adenopathy.  Skin:    Coloration: Skin is not pale.     Findings: No abrasion, erythema, petechiae or rash. Rash is not papular, urticarial or vesicular.  Neurological:     Mental Status: He is alert.  Psychiatric:        Behavior: Behavior is cooperative.      Diagnostic studies: none      Malachi Bonds, MD  [facility]

## 2023-12-12 NOTE — Patient Instructions (Addendum)
1. Chronic rhinitis - Previous testing showed: grasses, ragweed, weeds, trees, indoor molds, outdoor molds, and dust mites - Continue with allergy shots at the same schedule.  - You are slowly getting up there!  - Continue taking: Singulair (montelukast) 10mg  daily, [name] (levocetirizine) 5mg  daily, and Flonase (fluticasone) one spray per nostril daily and Astelin (azelastine) one spray per nostril up to twice daily - You can use an extra dose of the antihistamine, if needed, for breakthrough symptoms.  - Consider nasal saline rinses 1-2 times daily to remove allergens from the nasal cavities as well as help with mucous clearance (this is especially helpful to do before the nasal sprays are given)  2. COPD GOLD III  - Continue to follow with Dr. Sherene Sires. - Lung testing looked much better today. - We are not going to make any changes at all.  - Continue with Trelegy one puff once daily.  - I am going to send my note to Dr. Sherene Sires to keep him in the loop.   3. Return in about 1 year (around 12/11/2024).    Please inform us of any Emergency Department visits, hospitalizations, or changes in symptoms. Call us before going to the ED for breathing or allergy symptoms since we might be able to fit you in for a sick visit. Feel free to contact us anytime with any questions, problems, or concerns.  It was a pleasure to see you again today!  Websites that have reliable patient information: 1. American Academy of Asthma, Allergy, and Immunology: www.aaaai.org 2. Food Allergy Research and Education (FARE): foodallergy.org 3. Mothers of Asthmatics: http://www.asthmacommunitynetwork.org 4. American College of Allergy, Asthma, and Immunology: www.acaai.org   COVID-19 Vaccine Information can be found at: [email or web address].[email or web address] For questions related to vaccine distribution or appointments, please email [email or web address] or call [phone].   We  realize that you might be concerned about having an allergic reaction to the COVID19 vaccines. To help with that concern, WE ARE OFFERING THE COVID19 VACCINES IN OUR OFFICE! Ask the front desk for dates!     "Like" [place] on Facebook and Instagram for our latest updates!      A healthy democracy works best when [employer] participate! Make sure you are registered to vote! If you have moved or changed any of your contact information, you will need to get this updated before voting!  In some cases, you MAY be able to register to vote online: [email or web address]

## 2023-12-19 ENCOUNTER — Ambulatory Visit (INDEPENDENT_AMBULATORY_CARE_PROVIDER_SITE_OTHER): Payer: Self-pay

## 2023-12-19 DIAGNOSIS — J309 Allergic rhinitis, unspecified: Secondary | ICD-10-CM | POA: Diagnosis not present

## 2023-12-24 ENCOUNTER — Ambulatory Visit (INDEPENDENT_AMBULATORY_CARE_PROVIDER_SITE_OTHER): Payer: Self-pay

## 2023-12-24 DIAGNOSIS — J309 Allergic rhinitis, unspecified: Secondary | ICD-10-CM | POA: Diagnosis not present

## 2023-12-26 ENCOUNTER — Ambulatory Visit (INDEPENDENT_AMBULATORY_CARE_PROVIDER_SITE_OTHER): Payer: Medicare HMO | Admitting: *Deleted

## 2023-12-26 DIAGNOSIS — J309 Allergic rhinitis, unspecified: Secondary | ICD-10-CM | POA: Diagnosis not present

## 2023-12-30 DIAGNOSIS — E1165 Type 2 diabetes mellitus with hyperglycemia: Secondary | ICD-10-CM | POA: Diagnosis not present

## 2023-12-30 DIAGNOSIS — J449 Chronic obstructive pulmonary disease, unspecified: Secondary | ICD-10-CM | POA: Diagnosis not present

## 2024-01-02 ENCOUNTER — Ambulatory Visit (INDEPENDENT_AMBULATORY_CARE_PROVIDER_SITE_OTHER): Payer: Medicare HMO

## 2024-01-02 DIAGNOSIS — J309 Allergic rhinitis, unspecified: Secondary | ICD-10-CM | POA: Diagnosis not present

## 2024-01-05 ENCOUNTER — Ambulatory Visit (INDEPENDENT_AMBULATORY_CARE_PROVIDER_SITE_OTHER): Payer: Self-pay

## 2024-01-05 DIAGNOSIS — J309 Allergic rhinitis, unspecified: Secondary | ICD-10-CM

## 2024-01-06 NOTE — Progress Notes (Unsigned)
 Rajohn Henery, male    DOB: [birth year]     MRN: [id number]   Brief patient profile:  59 yowm from [place] MM/quit smoking 2019  with h/o asthma all his life could never play sports using inhalers and nebs daily and worse 2006 met criteria for disability 2012 and arrived in [place] since 2014  And breathing worse even when quit smoking at wt  270 and followed by [name] so referred to pulmonary clinic 05/11/2020 by Dr  Felecia Shelling     History of Present Illness  05/11/2020  Pulmonary/ 1st office eval/Cory Kitt on symbicort and spiriva Chief Complaint  Patient presents with   Pulmonary Consult    Referred by Dr Felecia Shelling. Former Dr [name] pt.    Dyspnea:  Able to do 10 - 15 minutes in cooler air Cough: non Sleep: in recliner 30 degrees  SABA use: using hfa and neb 4 x daily  02 prn daytime  Up to 4lpm pulsed and sometimes checks sats and finds them usually 88 or above but not usually checking with ex  rec We will order Overnight pulse oximetry on Room air and let you know how you did  Make sure you check your oxygen saturations at highest level of activity Plan A = Automatic = Always=    breztri Take 2 puffs first thing in am and then another 2 puffs about 12 hours later.  Work on inhaler technique:  Plan B = Backup (to supplement plan A, not to replace it) Only use your albuterol inhaler as a rescue medication Plan C = Crisis (instead of Plan B but only if Plan B stops working) - only use your albuterol nebulizer if you first try Plan B Try albuterol 15 min before an activity that you know would make you short of breath and see if it makes any difference and if makes none then don't take it after activity unless you can't catch your breath.        08/31/2020  f/u ov/[place] office/Elleah Hemsley re: GOLD III / breztri maint/ 02 dep hs and ex  Chief Complaint  Patient presents with   Follow-up    Breathing has been worse for the past month. He is SOB walking accross the room.   Dyspnea:  In cool weather  does better still not having to stop walking every 1.5 aisle  150 ft to mb is flat stop 3/4  never checks 02 - doesn't use it either unless "gives out"  Cough:  Dry cough daytime  Sleeping: on side bed flat/ 2 pillows  SABA use: using albuterol hfa qid  02: sitting still no 02  /2 liters at hs and pulsing on 2lpm with ex but not monitoring as rec  Worse for the last month with sneezing/ dry cough daytime  rec GERD diet   Protonix 40 mg Take 30-60 min before first meal of the day  Prednisone 10 mg take  4 each am x 2 days,   2 each am x 2 days,  1 each am x 2 days and stop (don't take until after blood test) Try albuterol 15 min before an activity that you know would make you short of breath      Make sure you check your oxygen saturations at highest level of activity to be sure it stays over 90%   - Allergy profile 08/31/2020 >  Eos 0.2 /  IgE  1326 - Alpha one AT phenotype 08/31/2020    MM   Level 147  08/19/2022  f/u ov/[place] office/Kendricks Reap re: GOLD 3  maint on breztri/singulair and prn 02   Chief Complaint  Patient presents with   Follow-up    Feels breathing is doing okay. Using 3.5LO2  Dyspnea:  mb and back x 150 ft x flat with rollator and 02 x 3lpm but not checking  Cough: minimal attributes to pnds Sleeping: flat bed, 3 pillows  SABA use: using neb each am  instead of breztri / hfa for [employer]  02: 3lpm hs wakes up feeling good no ha or hypersomnolence or head fog Rec Plan A = Automatic = Always=    Breztri or Trelegy  Work on inhaler technique:  Plan B = Backup (to supplement plan A, not to replace it) Only use your albuterol inhaler as a rescue medication  Plan C = Crisis (instead of Plan B but only if Plan B stops working) - only use your albuterol nebulizer if you first try Plan B  My office will be contacting you by phone for referral for Overnight 02 sats on RA  Make sure you check your oxygen saturation  AT  your highest level of activity (not after you  stop)   to be sure it stays over 90%  -  08/30/22 ONO RA desats x 1 h 46 min < 89%    -  09/16/22   ONO on 2lpm with < 5 min at less than 89% so no changes needed    07/02/2023  f/u ov/[place] office/Laraya Pestka re: GOLD 3 02 dep maint on trelegy 100  and  singulair  Chief Complaint  Patient presents with   COPD    Gold 3  Dyspnea:  mb and back with rollator maybe a quarter mile flat NOT checking sats  Cough: none / some pnds/ sneezing  Sleeping: flat bed/ 3 pillows s resp cc  SABA use:  the same as on trelegy  02: 2lpm and 2.5 walking but not really titrating Lung cancer screening: Jan 2024  Rec Make sure you check your oxygen saturation  AT  your highest level of activity (not after you stop)   to be sure it stays over 90% and adjust  02 flow upward to maintain this level if needed but remember to turn it back to previous settings when you stop (to conserve your supply). Also  Ok to try albuterol 15 min before an activity (on alternating days between the inhaler, the nebulizer, then nothing )  that you know would usually make you short of breath (Mailbox and back)   01/08/2024  f/u ov/[place] office/Yardley Lekas re: GOLD 3 copd /02 dep maint on trelegy   Chief Complaint  Patient presents with   Follow-up    6 month follow up  Dyspnea:  mb and back quarter mile flat / 02 2lpm not stopping  Cough: none  Sleeping: flat bed  wedge s   resp cc  SABA use: neb 4 x daily / rare hfa  02: 2lpm 24/7   Lung cancer screening: Nov 21 2023 = RADs 2   No obvious day to day or daytime variability or assoc excess/ purulent sputum or mucus plugs or hemoptysis or cp or chest tightness, subjective wheeze or overt sinus or hb symptoms.    Also denies any obvious fluctuation of symptoms with weather or environmental changes or other aggravating or alleviating factors except as outlined above   No unusual exposure hx or h/o childhood pna  or knowledge of premature birth.  Current Allergies,  Complete Past  Medical History, Past Surgical History, Family History, and Social History were reviewed in [employer] record.  ROS  The following are not active complaints unless bolded Hoarseness, sore throat, dysphagia, dental problems, itching, sneezing,  nasal congestion or discharge of excess mucus or purulent secretions, ear ache,   fever, chills, sweats, unintended wt loss or wt gain, classically pleuritic or exertional cp,  orthopnea pnd or arm/hand swelling  or leg swelling, presyncope, palpitations, abdominal pain, anorexia, nausea, vomiting, diarrhea  or change in bowel habits or change in bladder habits, change in stools or change in urine, dysuria, hematuria,  rash, arthralgias, visual complaints, headache, numbness, weakness or ataxia or problems with walking with 02 = 4 wheeled  rollator   or coordination,  change in mood or  memory.        Current Meds  Medication Sig   ACCU-CHEK GUIDE test strip    Accu-Chek Softclix Lancets lancets daily. for testing   albuterol (ACCUNEB) 1.25 MG/3ML nebulizer solution Take 1 ampule by nebulization every 6 (six) hours as needed for wheezing or shortness of breath.   albuterol (VENTOLIN HFA) 108 (90 Base) MCG/ACT inhaler INHALE 2 PUFFS BY MOUTH EVERY 4 HOURS AS NEEDED FOR WHEEZING OR SHORTNESS OF BREATH   atorvastatin (LIPITOR) 20 MG tablet    azelastine (ASTELIN) 0.1 % nasal spray Place 1 spray into both nostrils 2 (two) times daily. Use in each nostril as directed   baclofen (LIORESAL) 20 MG tablet Take 20 mg by mouth 3 (three) times daily.   cetirizine (ZYRTEC) 10 MG tablet TAKE 1 TABLET BY MOUTH TWICE DAILY AS NEEDED FOR ALLERGIES.   citalopram (CELEXA) 20 MG tablet Take 20 mg by mouth daily.   EPINEPHRINE 0.3 mg/0.3 mL IJ SOAJ injection Inject one dose intramuscularly for allergic reaction. May repeat one dose if needed after 5-15 minutes. Proceed to the ER   fluticasone (FLONASE) 50 MCG/ACT nasal spray Place 1 spray into both  nostrils daily.   furosemide (LASIX) 20 MG tablet Take 20 mg by mouth daily as needed.   levocetirizine ([name]) 5 MG tablet Take 1 tablet (5 mg total) by mouth every evening.   metFORMIN (GLUCOPHAGE) 500 MG tablet Take 500 mg by mouth 2 (two) times daily with a meal.   montelukast (SINGULAIR) 10 MG tablet Take 1 tablet (10 mg total) by mouth at bedtime.   pantoprazole (PROTONIX) 40 MG tablet TAKE 1 TABLET BY MOUTH DAILY 30-60 MINUTES BEFORE THE first meal of THE DAY   PROAIR HFA 108 (90 Base) MCG/ACT inhaler Inhale 1-2 puffs into the lungs 4 (four) times daily as needed for wheezing or shortness of breath.    TRADJENTA 5 MG TABS tablet Take 5 mg by mouth daily.   traZODone (DESYREL) 100 MG tablet Take two (2) tablets by mouth at bedtime   TRELEGY ELLIPTA 100-62.5-25 MCG/ACT AEPB Inhale 1 puff into the lungs daily.          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   Hemorrhoids    History of kidney stones    Insomnia    Knee pain    Panic attacks       Objective:    Wts  01/08/2024             305  07/02/2023             304 12/10/2022             304 08/19/2022             308 02/25/2022             298  08/20/2021           311   03/13/2021              321  05/11/20 (!) 320 lb (145.2 kg)  09/16/19 (!) 315 lb (142.9 kg)  07/03/19 (!) 324 lb 8 oz (147.2 kg)    Vital signs reviewed  01/08/2024  - Note at rest 02 sats  91% on 2lpm cont    General appearance:    MO(by bmi) amb with rollator / slt hoarse wm nad   HEENT : Oropharynx  clear/ edentulous   Nasal turbinates nl   NECK :  without  apparent JVD/ palpable Nodes/TM    LUNGS: no acc muscle use,  Mild barrel  contour chest wall with bilateral  Distant bs s audible wheeze and  without cough on insp or exp  maneuvers  and mild  Hyperresonant  to  percussion bilaterally     CV:  RRR  no s3 or murmur or increase in P2, and no edema   ABD: quite obese  soft and nontender   MS:  Nl gait/ ext warm without deformities Or obvious joint restrictions  calf tenderness, cyanosis or clubbing     SKIN: warm and dry without lesions    NEURO:  alert, approp, nl sensorium with  no motor or cerebellar deficits apparent.               Assessment

## 2024-01-07 ENCOUNTER — Ambulatory Visit (INDEPENDENT_AMBULATORY_CARE_PROVIDER_SITE_OTHER): Payer: Self-pay

## 2024-01-07 DIAGNOSIS — J309 Allergic rhinitis, unspecified: Secondary | ICD-10-CM

## 2024-01-08 ENCOUNTER — Ambulatory Visit: Payer: Medicare HMO | Admitting: Internal Medicine

## 2024-01-08 ENCOUNTER — Encounter: Payer: Self-pay | Admitting: Internal Medicine

## 2024-01-08 VITALS — BP 145/78 | HR 68 | Ht 72.0 in | Wt 305.0 lb

## 2024-01-08 DIAGNOSIS — J9611 Chronic respiratory failure with hypoxia: Secondary | ICD-10-CM | POA: Diagnosis not present

## 2024-01-08 DIAGNOSIS — J449 Chronic obstructive pulmonary disease, unspecified: Secondary | ICD-10-CM

## 2024-01-08 NOTE — Patient Instructions (Signed)
 Plan A = Automatic = Always=   Trelegy 100 each am   Work on inhaler technique:  relax and gently blow all the way out then take a nice smooth full deep breath back in.  Hold breath in for at least  5 seconds if you can. Blow out treleg thru nose. Rinse and gargle with water when done.  If mouth or throat bother you at all,  try brushing teeth/gums/tongue with arm and hammer toothpaste/ make a slurry and gargle and spit out.     Plan B = Backup (to supplement plan A, not to replace it) Only use your albuterol inhaler as a rescue medication to be used if you can't catch your breath by resting or doing a relaxed purse lip breathing pattern.  - The less you use it, the better it will work when you need it. - Ok to use the inhaler up to 2 puffs  every 4 hours if you must but call for appointment if use goes up over your usual need - Don't leave home without it !!  (think of it like the spare tire for your car)   Plan C = Crisis (instead of Plan B but only if Plan B stops working) - only use your albuterol nebulizer if you first try Plan B and it fails to help > ok to use the nebulizer up to every 4 hours but if start needing it regularly call for immediate appointment   Make sure you check your oxygen saturation  AT  your highest level of activity (not after you stop)   to be sure it stays over 90% and adjust  02 flow upward to maintain this level if needed but remember to turn it back to previous settings when you stop (to conserve your supply).    Please schedule a follow up visit in 12 months but call sooner if needed

## 2024-01-08 NOTE — Assessment & Plan Note (Signed)
 Body mass index is 41.37 kg/m.  -  trending up slowoly Lab Results  Component Value Date   TSH 0.646 09/05/2020      Contributing to doe and risk of GERD/ dvt/PE >>>   reviewed the need and the process to achieve and maintain neg calorie balance > defer f/u primary care including intermittently monitoring thyroid status     F/uq 12 m, sooner prn          Each maintenance medication was reviewed in detail including emphasizing most importantly the difference between maintenance and prns and under what circumstances the prns are to be triggered using an action plan format where appropriate.  Total time for H and P, chart review, counseling, reviewing hfa/neb/ 02/pulse ox  device(s) and generating customized AVS unique to this office visit / same day charting = 33 min

## 2024-01-08 NOTE — Assessment & Plan Note (Addendum)
 Quit smoking 2017  - PFT's  05/11/2020  FEV1 1.32 (33 % ) ratio 0.42  p 25 % improvement from saba p ? 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 08/31/2020    MM   Level 147  - 08/19/2022  After extensive coaching inhaler device,  effectiveness =   80% with hfa and DPI > trial of trelegy 100 at pt request  - LDSCT 11/18/22 Mild diffuse bronchial wall thickening with mild centrilobular and paraseptal emphysema - 01/08/2024  After extensive coaching inhaler device,  effectiveness =    60^% (short Ti)  on hfa/ 80% on DPI (poor insp flows) > continue trelegy 100/ prn sba hfa > neb as plan C    Group D (now reclassified as E) in terms of symptom/risk and laba/lama/ICS  therefore appropriate rx at this point >>>  trelegy and approp saba (over using neb saba at present)   Re SABA :  I spent extra time with pt today reviewing appropriate use of albuterol for prn use on exertion with the following points: 1) saba is for relief of sob that does not improve by walking a slower pace or resting but rather if the pt does not improve after trying this first. 2) If the pt is convinced, as many are, that saba helps recover from activity faster then it's easy to tell if this is the case by re-challenging : ie stop, take the inhaler, then p 5 minutes try the exact same activity (intensity of workload) that just caused the symptoms and see if they are substantially diminished or not after saba 3) if there is an activity that reproducibly causes the symptoms, try the saba 15 min before the activity on alternate days   If in fact the saba really does help, then fine to continue to use it prn but advised may need to look closer at the maintenance regimen being used to achieve better control of airways disease with exertion.

## 2024-01-08 NOTE — Assessment & Plan Note (Signed)
 Placed on 02 by [name] ? when - ONO RA 05/17/20  desat < 89% x 1 h 44 min  So rec continue noct 02 and titrate daytime to keep > 90%  -  08/31/2020   Walked RA  approx   200 ft  @ avg pace  stopped due to  Sob with sats 93% - 08/20/2021   Walked on RA x  3  lap(s) =  approx 525 ft  @  moderate pace with rollator, stopped due to sob x 2  with lowest 02 sats 96%  - 08/19/2022 patient walked 300 ft  walker assist on 3LO2 cont. Stopped after lap 2 due to being tired and SOB. Lowest sats = 94%  - 08/30/22 ONO RA desats x 1 h 46 min < 89%    -  09/16/22  ONO on 2lpm with < 5 min at less than 89% so no changes needed  - 07/02/2023   Walked on 3lpm pulsed    x  2  lap(s) =  approx 300  ft  @ slow pace, stopped due to fatigue with lowest 02 sats 95% and no sob    Reminded: Make sure you check your oxygen saturation  AT  your highest level of activity (not after you stop)   to be sure it stays over 90% and adjust  02 flow upward to maintain this level if needed but remember to turn it back to previous settings when you stop (to conserve your supply).

## 2024-01-14 ENCOUNTER — Ambulatory Visit (INDEPENDENT_AMBULATORY_CARE_PROVIDER_SITE_OTHER): Payer: Self-pay

## 2024-01-14 DIAGNOSIS — J309 Allergic rhinitis, unspecified: Secondary | ICD-10-CM | POA: Diagnosis not present

## 2024-01-16 ENCOUNTER — Ambulatory Visit (INDEPENDENT_AMBULATORY_CARE_PROVIDER_SITE_OTHER): Payer: Self-pay

## 2024-01-16 DIAGNOSIS — J309 Allergic rhinitis, unspecified: Secondary | ICD-10-CM

## 2024-01-21 ENCOUNTER — Ambulatory Visit (INDEPENDENT_AMBULATORY_CARE_PROVIDER_SITE_OTHER): Payer: Self-pay

## 2024-01-21 DIAGNOSIS — J309 Allergic rhinitis, unspecified: Secondary | ICD-10-CM

## 2024-01-23 ENCOUNTER — Ambulatory Visit (INDEPENDENT_AMBULATORY_CARE_PROVIDER_SITE_OTHER): Payer: Self-pay

## 2024-01-23 DIAGNOSIS — J309 Allergic rhinitis, unspecified: Secondary | ICD-10-CM | POA: Diagnosis not present

## 2024-01-27 DIAGNOSIS — E1165 Type 2 diabetes mellitus with hyperglycemia: Secondary | ICD-10-CM | POA: Diagnosis not present

## 2024-01-27 DIAGNOSIS — J449 Chronic obstructive pulmonary disease, unspecified: Secondary | ICD-10-CM | POA: Diagnosis not present

## 2024-01-28 ENCOUNTER — Ambulatory Visit (INDEPENDENT_AMBULATORY_CARE_PROVIDER_SITE_OTHER): Payer: Self-pay

## 2024-01-28 DIAGNOSIS — J309 Allergic rhinitis, unspecified: Secondary | ICD-10-CM

## 2024-01-30 ENCOUNTER — Ambulatory Visit (INDEPENDENT_AMBULATORY_CARE_PROVIDER_SITE_OTHER): Payer: Self-pay

## 2024-01-30 DIAGNOSIS — J309 Allergic rhinitis, unspecified: Secondary | ICD-10-CM

## 2024-02-01 ENCOUNTER — Other Ambulatory Visit: Payer: Self-pay | Admitting: Allergy & Immunology

## 2024-02-02 ENCOUNTER — Other Ambulatory Visit: Payer: Self-pay | Admitting: Allergy & Immunology

## 2024-02-03 DIAGNOSIS — F331 Major depressive disorder, recurrent, moderate: Secondary | ICD-10-CM | POA: Diagnosis not present

## 2024-02-04 ENCOUNTER — Ambulatory Visit (INDEPENDENT_AMBULATORY_CARE_PROVIDER_SITE_OTHER): Payer: Self-pay

## 2024-02-04 DIAGNOSIS — J309 Allergic rhinitis, unspecified: Secondary | ICD-10-CM | POA: Diagnosis not present

## 2024-02-06 ENCOUNTER — Ambulatory Visit (INDEPENDENT_AMBULATORY_CARE_PROVIDER_SITE_OTHER): Payer: Self-pay

## 2024-02-06 DIAGNOSIS — J309 Allergic rhinitis, unspecified: Secondary | ICD-10-CM | POA: Diagnosis not present

## 2024-02-11 ENCOUNTER — Ambulatory Visit (INDEPENDENT_AMBULATORY_CARE_PROVIDER_SITE_OTHER): Payer: Self-pay

## 2024-02-11 DIAGNOSIS — J309 Allergic rhinitis, unspecified: Secondary | ICD-10-CM | POA: Diagnosis not present

## 2024-02-13 ENCOUNTER — Ambulatory Visit (INDEPENDENT_AMBULATORY_CARE_PROVIDER_SITE_OTHER)

## 2024-02-13 DIAGNOSIS — J309 Allergic rhinitis, unspecified: Secondary | ICD-10-CM | POA: Diagnosis not present

## 2024-02-18 ENCOUNTER — Ambulatory Visit (INDEPENDENT_AMBULATORY_CARE_PROVIDER_SITE_OTHER): Payer: Self-pay

## 2024-02-18 DIAGNOSIS — J309 Allergic rhinitis, unspecified: Secondary | ICD-10-CM | POA: Diagnosis not present

## 2024-02-20 ENCOUNTER — Telehealth: Payer: Self-pay

## 2024-02-20 ENCOUNTER — Ambulatory Visit (INDEPENDENT_AMBULATORY_CARE_PROVIDER_SITE_OTHER)

## 2024-02-20 ENCOUNTER — Telehealth: Payer: Self-pay | Admitting: Allergy & Immunology

## 2024-02-20 DIAGNOSIS — J309 Allergic rhinitis, unspecified: Secondary | ICD-10-CM

## 2024-02-20 MED ORDER — LEVOCETIRIZINE DIHYDROCHLORIDE 5 MG PO TABS: 5.0000 mg | ORAL_TABLET | Freq: Every day | ORAL | 2 refills | Status: AC | PRN

## 2024-02-20 NOTE — Telephone Encounter (Signed)
 Pharmacy Patient Advocate Encounter  Received notification from [facility] that Prior Authorization for Levocetirizine Dihydrochloride 5MG  tablets  has been APPROVED from 02/20/2024 to 11/10/2024

## 2024-02-20 NOTE — Telephone Encounter (Signed)
 Called and notified patient that his [name] has been sent in to the pharmacy  and that he can pick it up. Patient verbalized understanding and agreed to pick up the medications this weekend.

## 2024-02-20 NOTE — Telephone Encounter (Signed)
*  Asthma/Allergy  Pharmacy Patient Advocate Encounter   Received notification from CoverMyMeds that prior authorization for Levocetirizine Dihydrochloride 5MG  tablets  is required/requested.   Insurance verification completed.   The patient is insured through [employer] .   Per test claim: PA required; PA submitted to above mentioned insurance via CoverMyMeds Key/confirmation #/EOC [id number] Status is pending

## 2024-02-20 NOTE — Telephone Encounter (Signed)
 Patient called stating he needs a refill on Levocetirizine sent to [facility].

## 2024-02-23 ENCOUNTER — Ambulatory Visit (INDEPENDENT_AMBULATORY_CARE_PROVIDER_SITE_OTHER): Payer: Self-pay

## 2024-02-23 DIAGNOSIS — J309 Allergic rhinitis, unspecified: Secondary | ICD-10-CM

## 2024-02-25 ENCOUNTER — Ambulatory Visit (INDEPENDENT_AMBULATORY_CARE_PROVIDER_SITE_OTHER): Payer: Self-pay

## 2024-02-25 DIAGNOSIS — J309 Allergic rhinitis, unspecified: Secondary | ICD-10-CM | POA: Diagnosis not present

## 2024-02-27 DIAGNOSIS — E1165 Type 2 diabetes mellitus with hyperglycemia: Secondary | ICD-10-CM | POA: Diagnosis not present

## 2024-02-27 DIAGNOSIS — J449 Chronic obstructive pulmonary disease, unspecified: Secondary | ICD-10-CM | POA: Diagnosis not present

## 2024-03-05 ENCOUNTER — Ambulatory Visit (INDEPENDENT_AMBULATORY_CARE_PROVIDER_SITE_OTHER): Payer: Self-pay

## 2024-03-05 DIAGNOSIS — J309 Allergic rhinitis, unspecified: Secondary | ICD-10-CM

## 2024-03-12 ENCOUNTER — Ambulatory Visit (INDEPENDENT_AMBULATORY_CARE_PROVIDER_SITE_OTHER): Payer: Self-pay

## 2024-03-12 DIAGNOSIS — J309 Allergic rhinitis, unspecified: Secondary | ICD-10-CM | POA: Diagnosis not present

## 2024-03-19 ENCOUNTER — Ambulatory Visit (INDEPENDENT_AMBULATORY_CARE_PROVIDER_SITE_OTHER): Payer: Self-pay

## 2024-03-19 DIAGNOSIS — J309 Allergic rhinitis, unspecified: Secondary | ICD-10-CM

## 2024-03-24 DIAGNOSIS — I4891 Unspecified atrial fibrillation: Secondary | ICD-10-CM | POA: Diagnosis not present

## 2024-03-24 DIAGNOSIS — I480 Paroxysmal atrial fibrillation: Secondary | ICD-10-CM | POA: Diagnosis not present

## 2024-03-24 DIAGNOSIS — R000 Tachycardia, unspecified: Secondary | ICD-10-CM | POA: Diagnosis not present

## 2024-03-24 DIAGNOSIS — R0602 Shortness of breath: Secondary | ICD-10-CM | POA: Diagnosis not present

## 2024-03-24 DIAGNOSIS — Z9981 Dependence on supplemental oxygen: Secondary | ICD-10-CM | POA: Diagnosis not present

## 2024-03-24 DIAGNOSIS — Z66 Do not resuscitate: Secondary | ICD-10-CM | POA: Diagnosis not present

## 2024-03-24 DIAGNOSIS — J9611 Chronic respiratory failure with hypoxia: Secondary | ICD-10-CM | POA: Diagnosis not present

## 2024-03-24 DIAGNOSIS — R944 Abnormal results of kidney function studies: Secondary | ICD-10-CM | POA: Diagnosis not present

## 2024-03-24 DIAGNOSIS — Z79899 Other long term (current) drug therapy: Secondary | ICD-10-CM | POA: Diagnosis not present

## 2024-03-24 DIAGNOSIS — R0989 Other specified symptoms and signs involving the circulatory and respiratory systems: Secondary | ICD-10-CM | POA: Diagnosis not present

## 2024-03-24 DIAGNOSIS — R062 Wheezing: Secondary | ICD-10-CM | POA: Diagnosis not present

## 2024-03-24 DIAGNOSIS — E8770 Fluid overload, unspecified: Secondary | ICD-10-CM | POA: Diagnosis not present

## 2024-03-24 DIAGNOSIS — I340 Nonrheumatic mitral (valve) insufficiency: Secondary | ICD-10-CM | POA: Diagnosis not present

## 2024-03-24 DIAGNOSIS — Z792 Long term (current) use of antibiotics: Secondary | ICD-10-CM | POA: Diagnosis not present

## 2024-03-24 DIAGNOSIS — I509 Heart failure, unspecified: Secondary | ICD-10-CM | POA: Diagnosis not present

## 2024-03-24 DIAGNOSIS — J441 Chronic obstructive pulmonary disease with (acute) exacerbation: Secondary | ICD-10-CM | POA: Diagnosis not present

## 2024-03-24 DIAGNOSIS — Z87891 Personal history of nicotine dependence: Secondary | ICD-10-CM | POA: Diagnosis not present

## 2024-03-24 DIAGNOSIS — E119 Type 2 diabetes mellitus without complications: Secondary | ICD-10-CM | POA: Diagnosis not present

## 2024-03-24 DIAGNOSIS — E872 Acidosis, unspecified: Secondary | ICD-10-CM | POA: Diagnosis not present

## 2024-03-24 DIAGNOSIS — Z6841 Body Mass Index (BMI) 40.0 and over, adult: Secondary | ICD-10-CM | POA: Diagnosis not present

## 2024-03-24 DIAGNOSIS — I5021 Acute systolic (congestive) heart failure: Secondary | ICD-10-CM | POA: Diagnosis not present

## 2024-03-24 DIAGNOSIS — E8721 Acute metabolic acidosis: Secondary | ICD-10-CM | POA: Diagnosis not present

## 2024-03-29 NOTE — Transitions of Care (Post Inpatient/ED Visit) (Signed)
 03/29/2024  Patient ID: Fredrik Jensen, male   DOB: 1956-09-06, 68 y.o.   MRN: [id number]  Chart Review for transitions of care.  See [employer] for documentation.  Daltin Crist J. Bobie Caris RN, MSN [facility], [facility] RN Care Manager Direct Dial: [phone]  Fax: [phone] Website: [email or web address]

## 2024-03-30 DIAGNOSIS — I4891 Unspecified atrial fibrillation: Secondary | ICD-10-CM | POA: Diagnosis not present

## 2024-03-30 DIAGNOSIS — I5021 Acute systolic (congestive) heart failure: Secondary | ICD-10-CM | POA: Diagnosis not present

## 2024-03-30 DIAGNOSIS — E1165 Type 2 diabetes mellitus with hyperglycemia: Secondary | ICD-10-CM | POA: Diagnosis not present

## 2024-03-30 DIAGNOSIS — Z9981 Dependence on supplemental oxygen: Secondary | ICD-10-CM | POA: Diagnosis not present

## 2024-03-30 DIAGNOSIS — J9611 Chronic respiratory failure with hypoxia: Secondary | ICD-10-CM | POA: Diagnosis not present

## 2024-03-30 DIAGNOSIS — J441 Chronic obstructive pulmonary disease with (acute) exacerbation: Secondary | ICD-10-CM | POA: Diagnosis not present

## 2024-04-02 DIAGNOSIS — Z79899 Other long term (current) drug therapy: Secondary | ICD-10-CM | POA: Diagnosis not present

## 2024-04-02 DIAGNOSIS — K219 Gastro-esophageal reflux disease without esophagitis: Secondary | ICD-10-CM | POA: Diagnosis not present

## 2024-04-02 DIAGNOSIS — K760 Fatty (change of) liver, not elsewhere classified: Secondary | ICD-10-CM | POA: Diagnosis not present

## 2024-04-02 DIAGNOSIS — E1165 Type 2 diabetes mellitus with hyperglycemia: Secondary | ICD-10-CM | POA: Diagnosis not present

## 2024-04-04 DIAGNOSIS — R32 Unspecified urinary incontinence: Secondary | ICD-10-CM | POA: Diagnosis not present

## 2024-04-04 DIAGNOSIS — I4891 Unspecified atrial fibrillation: Secondary | ICD-10-CM | POA: Diagnosis not present

## 2024-04-04 DIAGNOSIS — I509 Heart failure, unspecified: Secondary | ICD-10-CM | POA: Diagnosis not present

## 2024-04-04 DIAGNOSIS — Z7901 Long term (current) use of anticoagulants: Secondary | ICD-10-CM | POA: Diagnosis not present

## 2024-04-04 DIAGNOSIS — E119 Type 2 diabetes mellitus without complications: Secondary | ICD-10-CM | POA: Diagnosis not present

## 2024-04-04 DIAGNOSIS — Z7951 Long term (current) use of inhaled steroids: Secondary | ICD-10-CM | POA: Diagnosis not present

## 2024-04-04 DIAGNOSIS — G8929 Other chronic pain: Secondary | ICD-10-CM | POA: Diagnosis not present

## 2024-04-04 DIAGNOSIS — J441 Chronic obstructive pulmonary disease with (acute) exacerbation: Secondary | ICD-10-CM | POA: Diagnosis not present

## 2024-04-04 DIAGNOSIS — J9611 Chronic respiratory failure with hypoxia: Secondary | ICD-10-CM | POA: Diagnosis not present

## 2024-04-12 DIAGNOSIS — I509 Heart failure, unspecified: Secondary | ICD-10-CM | POA: Diagnosis not present

## 2024-04-12 DIAGNOSIS — Z7901 Long term (current) use of anticoagulants: Secondary | ICD-10-CM | POA: Diagnosis not present

## 2024-04-12 DIAGNOSIS — Z7951 Long term (current) use of inhaled steroids: Secondary | ICD-10-CM | POA: Diagnosis not present

## 2024-04-12 DIAGNOSIS — J9611 Chronic respiratory failure with hypoxia: Secondary | ICD-10-CM | POA: Diagnosis not present

## 2024-04-12 DIAGNOSIS — R32 Unspecified urinary incontinence: Secondary | ICD-10-CM | POA: Diagnosis not present

## 2024-04-12 DIAGNOSIS — J441 Chronic obstructive pulmonary disease with (acute) exacerbation: Secondary | ICD-10-CM | POA: Diagnosis not present

## 2024-04-12 DIAGNOSIS — I4891 Unspecified atrial fibrillation: Secondary | ICD-10-CM | POA: Diagnosis not present

## 2024-04-12 DIAGNOSIS — G8929 Other chronic pain: Secondary | ICD-10-CM | POA: Diagnosis not present

## 2024-04-12 DIAGNOSIS — E119 Type 2 diabetes mellitus without complications: Secondary | ICD-10-CM | POA: Diagnosis not present

## 2024-04-14 DIAGNOSIS — Z1389 Encounter for screening for other disorder: Secondary | ICD-10-CM | POA: Diagnosis not present

## 2024-04-14 DIAGNOSIS — Z7951 Long term (current) use of inhaled steroids: Secondary | ICD-10-CM | POA: Diagnosis not present

## 2024-04-14 DIAGNOSIS — I509 Heart failure, unspecified: Secondary | ICD-10-CM | POA: Diagnosis not present

## 2024-04-14 DIAGNOSIS — J449 Chronic obstructive pulmonary disease, unspecified: Secondary | ICD-10-CM | POA: Diagnosis not present

## 2024-04-14 DIAGNOSIS — F339 Major depressive disorder, recurrent, unspecified: Secondary | ICD-10-CM | POA: Diagnosis not present

## 2024-04-14 DIAGNOSIS — Z7901 Long term (current) use of anticoagulants: Secondary | ICD-10-CM | POA: Diagnosis not present

## 2024-04-14 DIAGNOSIS — I4891 Unspecified atrial fibrillation: Secondary | ICD-10-CM | POA: Diagnosis not present

## 2024-04-14 DIAGNOSIS — Z1331 Encounter for screening for depression: Secondary | ICD-10-CM | POA: Diagnosis not present

## 2024-04-14 DIAGNOSIS — E119 Type 2 diabetes mellitus without complications: Secondary | ICD-10-CM | POA: Diagnosis not present

## 2024-04-14 DIAGNOSIS — G8929 Other chronic pain: Secondary | ICD-10-CM | POA: Diagnosis not present

## 2024-04-14 DIAGNOSIS — J441 Chronic obstructive pulmonary disease with (acute) exacerbation: Secondary | ICD-10-CM | POA: Diagnosis not present

## 2024-04-14 DIAGNOSIS — R32 Unspecified urinary incontinence: Secondary | ICD-10-CM | POA: Diagnosis not present

## 2024-04-14 DIAGNOSIS — J9611 Chronic respiratory failure with hypoxia: Secondary | ICD-10-CM | POA: Diagnosis not present

## 2024-04-14 DIAGNOSIS — I5022 Chronic systolic (congestive) heart failure: Secondary | ICD-10-CM | POA: Diagnosis not present

## 2024-04-14 DIAGNOSIS — E1165 Type 2 diabetes mellitus with hyperglycemia: Secondary | ICD-10-CM | POA: Diagnosis not present

## 2024-04-14 DIAGNOSIS — I700 Atherosclerosis of aorta: Secondary | ICD-10-CM | POA: Diagnosis not present

## 2024-04-14 DIAGNOSIS — Z0001 Encounter for general adult medical examination with abnormal findings: Secondary | ICD-10-CM | POA: Diagnosis not present

## 2024-04-15 DIAGNOSIS — I4891 Unspecified atrial fibrillation: Secondary | ICD-10-CM | POA: Diagnosis not present

## 2024-04-16 DIAGNOSIS — I4891 Unspecified atrial fibrillation: Secondary | ICD-10-CM | POA: Diagnosis not present

## 2024-04-21 DIAGNOSIS — E119 Type 2 diabetes mellitus without complications: Secondary | ICD-10-CM | POA: Diagnosis not present

## 2024-04-21 DIAGNOSIS — Z7901 Long term (current) use of anticoagulants: Secondary | ICD-10-CM | POA: Diagnosis not present

## 2024-04-21 DIAGNOSIS — J441 Chronic obstructive pulmonary disease with (acute) exacerbation: Secondary | ICD-10-CM | POA: Diagnosis not present

## 2024-04-21 DIAGNOSIS — J9611 Chronic respiratory failure with hypoxia: Secondary | ICD-10-CM | POA: Diagnosis not present

## 2024-04-21 DIAGNOSIS — I509 Heart failure, unspecified: Secondary | ICD-10-CM | POA: Diagnosis not present

## 2024-04-21 DIAGNOSIS — R32 Unspecified urinary incontinence: Secondary | ICD-10-CM | POA: Diagnosis not present

## 2024-04-21 DIAGNOSIS — Z7951 Long term (current) use of inhaled steroids: Secondary | ICD-10-CM | POA: Diagnosis not present

## 2024-04-21 DIAGNOSIS — G8929 Other chronic pain: Secondary | ICD-10-CM | POA: Diagnosis not present

## 2024-04-21 DIAGNOSIS — I4891 Unspecified atrial fibrillation: Secondary | ICD-10-CM | POA: Diagnosis not present

## 2024-04-26 DIAGNOSIS — F32A Depression, unspecified: Secondary | ICD-10-CM | POA: Diagnosis not present

## 2024-04-26 DIAGNOSIS — I10 Essential (primary) hypertension: Secondary | ICD-10-CM | POA: Diagnosis not present

## 2024-04-26 DIAGNOSIS — J449 Chronic obstructive pulmonary disease, unspecified: Secondary | ICD-10-CM | POA: Diagnosis not present

## 2024-04-26 DIAGNOSIS — Z9981 Dependence on supplemental oxygen: Secondary | ICD-10-CM | POA: Diagnosis not present

## 2024-04-26 DIAGNOSIS — F419 Anxiety disorder, unspecified: Secondary | ICD-10-CM | POA: Diagnosis not present

## 2024-04-26 DIAGNOSIS — Z515 Encounter for palliative care: Secondary | ICD-10-CM | POA: Diagnosis not present

## 2024-04-26 DIAGNOSIS — I4891 Unspecified atrial fibrillation: Secondary | ICD-10-CM | POA: Diagnosis not present

## 2024-04-26 DIAGNOSIS — J9610 Chronic respiratory failure, unspecified whether with hypoxia or hypercapnia: Secondary | ICD-10-CM | POA: Diagnosis not present

## 2024-04-28 DIAGNOSIS — E119 Type 2 diabetes mellitus without complications: Secondary | ICD-10-CM | POA: Diagnosis not present

## 2024-04-28 DIAGNOSIS — J441 Chronic obstructive pulmonary disease with (acute) exacerbation: Secondary | ICD-10-CM | POA: Diagnosis not present

## 2024-04-28 DIAGNOSIS — I509 Heart failure, unspecified: Secondary | ICD-10-CM | POA: Diagnosis not present

## 2024-04-28 DIAGNOSIS — I4891 Unspecified atrial fibrillation: Secondary | ICD-10-CM | POA: Diagnosis not present

## 2024-04-28 DIAGNOSIS — G8929 Other chronic pain: Secondary | ICD-10-CM | POA: Diagnosis not present

## 2024-04-28 DIAGNOSIS — R32 Unspecified urinary incontinence: Secondary | ICD-10-CM | POA: Diagnosis not present

## 2024-04-28 DIAGNOSIS — Z7901 Long term (current) use of anticoagulants: Secondary | ICD-10-CM | POA: Diagnosis not present

## 2024-04-28 DIAGNOSIS — Z7951 Long term (current) use of inhaled steroids: Secondary | ICD-10-CM | POA: Diagnosis not present

## 2024-04-28 DIAGNOSIS — J9611 Chronic respiratory failure with hypoxia: Secondary | ICD-10-CM | POA: Diagnosis not present

## 2024-05-01 DIAGNOSIS — E119 Type 2 diabetes mellitus without complications: Secondary | ICD-10-CM | POA: Diagnosis not present

## 2024-05-01 DIAGNOSIS — J441 Chronic obstructive pulmonary disease with (acute) exacerbation: Secondary | ICD-10-CM | POA: Diagnosis not present

## 2024-05-01 DIAGNOSIS — Z7901 Long term (current) use of anticoagulants: Secondary | ICD-10-CM | POA: Diagnosis not present

## 2024-05-01 DIAGNOSIS — R32 Unspecified urinary incontinence: Secondary | ICD-10-CM | POA: Diagnosis not present

## 2024-05-01 DIAGNOSIS — I509 Heart failure, unspecified: Secondary | ICD-10-CM | POA: Diagnosis not present

## 2024-05-01 DIAGNOSIS — J9611 Chronic respiratory failure with hypoxia: Secondary | ICD-10-CM | POA: Diagnosis not present

## 2024-05-01 DIAGNOSIS — Z7951 Long term (current) use of inhaled steroids: Secondary | ICD-10-CM | POA: Diagnosis not present

## 2024-05-01 DIAGNOSIS — I4891 Unspecified atrial fibrillation: Secondary | ICD-10-CM | POA: Diagnosis not present

## 2024-05-01 DIAGNOSIS — G8929 Other chronic pain: Secondary | ICD-10-CM | POA: Diagnosis not present

## 2024-05-04 DIAGNOSIS — E119 Type 2 diabetes mellitus without complications: Secondary | ICD-10-CM | POA: Diagnosis not present

## 2024-05-04 DIAGNOSIS — G8929 Other chronic pain: Secondary | ICD-10-CM | POA: Diagnosis not present

## 2024-05-04 DIAGNOSIS — R32 Unspecified urinary incontinence: Secondary | ICD-10-CM | POA: Diagnosis not present

## 2024-05-04 DIAGNOSIS — Z7901 Long term (current) use of anticoagulants: Secondary | ICD-10-CM | POA: Diagnosis not present

## 2024-05-04 DIAGNOSIS — I509 Heart failure, unspecified: Secondary | ICD-10-CM | POA: Diagnosis not present

## 2024-05-04 DIAGNOSIS — J441 Chronic obstructive pulmonary disease with (acute) exacerbation: Secondary | ICD-10-CM | POA: Diagnosis not present

## 2024-05-04 DIAGNOSIS — J9611 Chronic respiratory failure with hypoxia: Secondary | ICD-10-CM | POA: Diagnosis not present

## 2024-05-04 DIAGNOSIS — Z7951 Long term (current) use of inhaled steroids: Secondary | ICD-10-CM | POA: Diagnosis not present

## 2024-05-04 DIAGNOSIS — I4891 Unspecified atrial fibrillation: Secondary | ICD-10-CM | POA: Diagnosis not present

## 2024-05-06 DIAGNOSIS — I4891 Unspecified atrial fibrillation: Secondary | ICD-10-CM | POA: Diagnosis not present

## 2024-05-06 DIAGNOSIS — J441 Chronic obstructive pulmonary disease with (acute) exacerbation: Secondary | ICD-10-CM | POA: Diagnosis not present

## 2024-05-06 DIAGNOSIS — Z7901 Long term (current) use of anticoagulants: Secondary | ICD-10-CM | POA: Diagnosis not present

## 2024-05-06 DIAGNOSIS — J9611 Chronic respiratory failure with hypoxia: Secondary | ICD-10-CM | POA: Diagnosis not present

## 2024-05-06 DIAGNOSIS — J45909 Unspecified asthma, uncomplicated: Secondary | ICD-10-CM | POA: Diagnosis not present

## 2024-05-06 DIAGNOSIS — G8929 Other chronic pain: Secondary | ICD-10-CM | POA: Diagnosis not present

## 2024-05-06 DIAGNOSIS — E119 Type 2 diabetes mellitus without complications: Secondary | ICD-10-CM | POA: Diagnosis not present

## 2024-05-06 DIAGNOSIS — J9620 Acute and chronic respiratory failure, unspecified whether with hypoxia or hypercapnia: Secondary | ICD-10-CM | POA: Diagnosis not present

## 2024-05-06 DIAGNOSIS — Z7951 Long term (current) use of inhaled steroids: Secondary | ICD-10-CM | POA: Diagnosis not present

## 2024-05-06 DIAGNOSIS — I509 Heart failure, unspecified: Secondary | ICD-10-CM | POA: Diagnosis not present

## 2024-05-06 DIAGNOSIS — J449 Chronic obstructive pulmonary disease, unspecified: Secondary | ICD-10-CM | POA: Diagnosis not present

## 2024-05-06 DIAGNOSIS — R32 Unspecified urinary incontinence: Secondary | ICD-10-CM | POA: Diagnosis not present

## 2024-05-09 NOTE — Progress Notes (Unsigned)
 Stephen Warren, male    DOB: 1955-12-09     MRN: [id number]   Brief patient profile:  5  yowm from [place]  MM/quit smoking 2019  with h/o asthma all his life could never play sports using inhalers and nebs daily and worse 2006 met criteria for disability 2012 and arrived in [place] since 2014  And breathing worse even when quit smoking at wt  89 and followed by Stephen Warren so referred to pulmonary clinic 05/11/2020 by Dr  Carlette     History of Present Illness  05/11/2020  Pulmonary/ 1st Warren eval/Stephen Warren on symbicort and spiriva  Chief Complaint  Patient presents with   Pulmonary Consult    Referred by Dr Carlette. Former Dr Stephen Warren pt.    Dyspnea:  Able to do 10 - 15 minutes in cooler air Cough: non Sleep: in recliner 30 degrees  SABA use: using hfa and neb 4 x daily  02 prn daytime  Up to 4lpm pulsed and sometimes checks sats and finds them usually 88 or above but not usually checking with ex  rec We will order Overnight pulse oximetry on Room air and let you know how you did  Make sure you check your oxygen  saturations at highest level of activity Plan A = Automatic = Always=    breztri  Take 2 puffs first thing in am and then another 2 puffs about 12 hours later.  Work on inhaler technique:  Plan B = Backup (to supplement plan A, not to replace it) Only use your albuterol  inhaler as a rescue medication Plan C = Crisis (instead of Plan B but only if Plan B stops working) - only use your albuterol  nebulizer if you first try Plan B Try albuterol  15 min before an activity that you know would make you short of breath and see if it makes any difference and if makes none then don't take it after activity unless you can't catch your breath.        08/31/2020  f/u ov/Stephen Warren/Stephen Warren re: GOLD III / breztri  maint/ 02 dep hs and ex  Chief Complaint  Patient presents with   Follow-up    Breathing has been worse for the past month. He is SOB walking accross the room.   Dyspnea:  In cool weather  does better still not having to stop walking every 1.5 aisle  150 ft to mb is flat stop 3/4  never checks 02 - doesn't use it either unless gives out  Cough:  Dry cough daytime  Sleeping: on side bed flat/ 2 pillows  SABA use: using albuterol  hfa qid  02: sitting still no 02  /2 liters at hs and pulsing on 2lpm with ex but not monitoring as rec  Worse for the last month with sneezing/ dry cough daytime  rec GERD diet   Protonix  40 mg Take 30-60 min before first meal of the day  Prednisone  10 mg take  4 each am x 2 days,   2 each am x 2 days,  1 each am x 2 days and stop (don't take until after blood test) Try albuterol  15 min before an activity that you know would make you short of breath      Make sure you check your oxygen  saturations at highest level of activity to be sure it stays over 90%   - Allergy profile 08/31/2020 >  Eos 0.2 /  IgE  1326 - Alpha 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 Using 3.5LO2  Dyspnea:  mb and back x 150 ft x flat with rollator and 02 x 3lpm but not checking  Cough: minimal attributes to pnds Sleeping: flat bed, 3 pillows  SABA use: using neb each am  instead of breztri  / hfa for [employer]  02: 3lpm hs wakes up feeling good no ha or hypersomnolence or head fog Rec Plan A = Automatic = Always=    Breztri  or Trelegy  Work on inhaler technique:  Plan B = Backup (to supplement plan A, not to replace it) Only use your albuterol  inhaler as a rescue medication  Plan C = Crisis (instead of Plan B but only if Plan B stops working) - only use your albuterol  nebulizer if you first try Plan B  My Warren will be contacting you by phone for referral for Overnight 02 sats on RA  Make sure you check your oxygen  saturation  AT  your highest level of activity (not after you  stop)   to be sure it stays over 90%  -  08/30/22 ONO RA desats x 1 h 46 min < 89%    -  09/16/22   ONO on 2lpm with < 5 min at less than 89% so no changes needed    07/02/2023  f/u ov/[place] Warren/Stephen Warren re: GOLD 3 02 dep maint on trelegy 100  and  singulair   Chief Complaint  Patient presents with   COPD    Gold 3  Dyspnea:  mb and back with rollator maybe a quarter mile flat NOT checking sats  Cough: none / some pnds/ sneezing  Sleeping: flat bed/ 3 pillows s resp cc  SABA use:  the same as on trelegy  02: 2lpm and 2.5 walking but not really titrating Lung cancer screening: Jan 2024  Rec Make sure you check your oxygen  saturation  AT  your highest level of activity (not after you stop)   to be sure it stays over 90% and adjust  02 flow upward to maintain this level if needed but remember to turn it back to previous settings when you stop (to conserve your supply). Also  Ok to try albuterol  15 min before an activity (on alternating days between the inhaler, the nebulizer, then nothing )  that you know would usually make you short of breath (Mailbox and back)   01/08/2024  f/u ov/[place] Warren/Stephen Warren re: GOLD 3 copd /02 dep maint on trelegy   Chief Complaint  Patient presents with   Follow-up    6 month follow up  Dyspnea:  mb and back quarter mile flat / 02 2lpm not stopping  Cough: none  Sleeping: flat bed  wedge s   resp cc  SABA use: neb 4 x daily / rare hfa  02: 2lpm 24/7  Lung cancer screening: Nov 21 2023 = RADs 2 Rec Plan A = Automatic = Always=   Trelegy 100 each am  Work on inhaler technique:   Plan B = Backup (to supplement plan A, not to replace it) Only use your albuterol  inhaler as a rescue medication  Plan C = Crisis (instead of Plan B but only if Plan B stops working) - only use your albuterol  nebulizer if you first try Plan B  Make sure  you check your oxygen  saturation  AT  your highest level of activity (not after you stop)   to be sure it stays over  90% Please schedule a follow up visit in 12 months but call sooner if needed   Discharge date: Mar 27, 2024 Length of stay: LOS: 3 days       Hospital course based on timeline of significant events after admission (by date): Please refer to H&P for full details. In summary, the patient has a history significant for COPD, chronic respiratory failure on home oxygen , type 2 diabetes mellitus and seasonal allergies who presented to the emergency department for worsening shortness of breath. Patient reported gradual worsening over 1 to 2 weeks. Patient is O2 dependent at home 2 to 3 L at baseline.  Patient was admitted for new onset of atrial fibrillation RVR, acute CHF likely secondary to new onset of A-fib, volume overload, and COPD exacerbation. Cardiology was consulted and we appreciate their input. Patient was started on diltiazem for rate control in the ER, then transitioned to oral diltiazem and Eliquis for anticoagulation for stroke prevention. Patient's CHA2DS2-VASc score is 2. Patient clinically improved and rate controlled. Patient will be set up for Zio patch prior to discharge home and I have discussed outpatient sleep study with the patient to rule out OSA which will need to be initiated by his PCP outpatient.   Extensive education provided at time of discharge regarding patient's medications. Patient will need to increase Lasix to 40 mg p.o. daily. I will start him on a low-dose of potassium to prevent hypokalemia, patient advised to weigh himself daily and instructions given on when to contact cardiology/PCP. Patient will need close outpatient follow-up with [facility] cardiology [place]. I have also recommended that he follow-up with his primary care doctor for follow-up BMP to follow-up on his renal function and potassium.  Chest x-ray consistent with volume overload. Patient was treated with IV Lasix with improvement in his symptoms. Patient was euvolemic day of discharge. Patient was on 2 L of  oxygen  nasal cannula at the time of discharge.  Patient was treated with empiric IV antibiotics with ceftriaxone and Zithromax  for his COPD exacerbation and DuoNebs. Leukocytosis resolved at time of discharge and was likely secondary to steroids given in the ER. Follows with [facility] pulmonology Dr. Darlean.   Patient is a DNR/DNI and reports he has paperwork at home. Case management arranging transportation and medications at time of discharge.   ______________________________________ Admission HPI   Patient admitted on: 03/24/2024 12:13 PM  Patient admitted by: Casimiro Charlyne Seidel, MD  CHIEF COMPLAINT: Shortness of breath  Day of admission HPI: 68 year old male with history of COPD, chronic respiratory failure on home oxygen , type 2 diabetes and seasonal allergies who was sent over here from his allergy clinic for worsening shortness of breath. Patient reports shortness of breath for the past 1 to 2 weeks which has gradually worsened to the extent that he cannot go about his usual business. Home pulse oximetry revealed gradually decreasing pulse ox, starting about 91% a week and a half ago, decreasing to 88% and 64% today at his allergist Warren. He admits associated cough with scant sputum as well as profound weakness.  When he arrived in the emergency room, patient was noted with pulse of 111, was found to be in A-fib with RVR. O2 saturation was 90%. EKG confirmed A-fib, chest x-ray revealed mild cardiomegaly with central vascular congestion and interstitial thickening.This labs showed WBC of 11.7 and a  proBNP of 1302. Lactic acid was 2.5. ABG revealed pH of 7.48, PO262 and PO2 of 39.6. Bicarb is 29.  Patient admitted on Home O2? - yes Patient on home anticoagulant? - no Patient admitted with Chronic home foley catheter? - no Foley catheter placed or replaced by another service prior to admission? - no  Mental Status on Admission: The patient is Alert and oriented to PERSON The patient is  Alert And oriented to TIME The patient is Alert and oriented to LOCATION   Problem List, Assessment & Plan   ASSESSMENT & PLAN (In order of descending acuity)  New onset atrial fibrillation  Overview: - Etiology likely to be COPD - Patient has been loaded with diltiazem in the emergency room and transition to p.o. - Cardiology consulted and we appreciate their input - Echo completed, EF estimated greater than 55% with no wall motion abnormality - Continue Cardizem 240 mg CD p.o. daily, Eliquis 5 mg p.o. twice daily - Zio patch 14-day prior to discharge home - Outpatient sleep study to rule out OSA  Chronic obstructive pulmonary disease with acute exacerbation  Overview: - DuoNeb every 6 hourly - Budesonide twice daily - Patient treated with IV Rocephin and azithromycin . Discharge with a 5-day course of doxycycline. - baseline home O2 2-3 L [place]  Abnormal renal function Likely secondary to diuresis Discontinue IV Lasix and transition to p.o.  Volume overload Euvolemic on exam Lasix 40 mg p.o. daily starting 03/27/2024  Acute congestive heart failure  Overview: - Etiology is likely new onset A-fib - proBNP is 1300, no old values available - cardiology following   D/c at  298 lb   05/11/2024  f/u ov/[place] Warren/Stephen Warren re: GOLD 3/ 02 dep maint on mutliple same meds (trlegy plus pulmicort plus alb neb and hfa   Chief Complaint  Patient presents with   Follow-up   COPD   Dyspnea:  much less active  Cough: none  Sleeping: flat bed/ wedge pillow R side down best position      SABA use: way too much with tremors noted  02: 2 lpm hs    No obvious day to day or daytime variability or assoc excess/ purulent sputum or mucus plugs or hemoptysis or cp or chest tightness, subjective wheeze or overt  hb symptoms.    Also denies any obvious fluctuation of symptoms with weather or environmental changes or other aggravating or alleviating factors except as outlined above   No  unusual exposure hx or h/o childhood pna/ asthma or knowledge of premature birth.  Current Allergies, Complete Past Medical History, Past Surgical History, Family History, and Social History were reviewed in [employer] record.  ROS  The following are not active complaints unless bolded Hoarseness, sore throat, dysphagia, dental problems, itching, sneezing,  nasal congestion or discharge of excess mucus or purulent secretions, ear ache,   fever, chills, sweats, unintended wt loss or wt gain, classically pleuritic or exertional cp,  orthopnea pnd or arm/hand swelling  or leg swelling, presyncope, palpitations, abdominal pain, anorexia, nausea, vomiting, diarrhea  or change in bowel habits or change in bladder habits, change in stools or change in urine, dysuria, hematuria,  rash, arthralgias, visual complaints, headache, numbness, weakness or ataxia or problems with walking or coordination,  change in mood or  memory.        Current Meds  Medication Sig   ACCU-CHEK GUIDE test strip    Accu-Chek Softclix Lancets lancets daily. for testing   albuterol  (ACCUNEB )  1.25 MG/3ML nebulizer solution Take 1 ampule by nebulization every 6 (six) hours as needed for wheezing or shortness of breath.   albuterol  (VENTOLIN  HFA) 108 (90 Base) MCG/ACT inhaler INHALE 2 PUFFS BY MOUTH EVERY 4 HOURS AS NEEDED FOR WHEEZING OR SHORTNESS OF BREATH   apixaban (ELIQUIS) 5 MG TABS tablet Take 5 mg by mouth 2 (two) times daily.   atorvastatin (LIPITOR) 20 MG tablet    azelastine  (ASTELIN ) 0.1 % nasal spray Place 1 spray into both nostrils 2 (two) times daily. Use in each nostril as directed   baclofen  (LIORESAL ) 20 MG tablet Take 20 mg by mouth 3 (three) times daily.   budesonide (PULMICORT) 0.5 MG/2ML nebulizer solution Take 0.5 mg by nebulization 2 (two) times daily.   citalopram  (CELEXA ) 20 MG tablet Take 20 mg by mouth daily.   EPINEPHRINE  0.3 mg/0.3 mL IJ SOAJ injection Inject one dose  intramuscularly for allergic reaction. May repeat one dose if needed after 5-15 minutes. Proceed to the ER   fluticasone  (FLONASE ) 50 MCG/ACT nasal spray Place 1 spray into both nostrils daily.   furosemide (LASIX) 20 MG tablet Take 20 mg by mouth daily as needed.   levocetirizine ([name] ) 5 MG tablet Take 1 tablet (5 mg total) by mouth daily as needed for allergies (Can take an extra dose during flare ups.).   metFORMIN (GLUCOPHAGE) 500 MG tablet Take 500 mg by mouth 2 (two) times daily with a meal.   montelukast  (SINGULAIR ) 10 MG tablet Take 1 tablet (10 mg total) by mouth at bedtime.   pantoprazole  (PROTONIX ) 40 MG tablet TAKE 1 TABLET BY MOUTH DAILY 30-60 MINUTES BEFORE THE first meal of THE DAY   potassium chloride  (KLOR-CON ) 10 MEQ tablet Take 10 mEq by mouth daily.   tadalafil (CIALIS) 5 MG tablet Take 5 mg by mouth daily.   TRADJENTA 5 MG TABS tablet Take 5 mg by mouth daily.   traZODone  (DESYREL ) 100 MG tablet Take two (2) tablets by mouth at bedtime   TRELEGY ELLIPTA 100-62.5-25 MCG/ACT AEPB Inhale 1 puff into the lungs daily.             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   Hemorrhoids    History of kidney stones    Insomnia    Knee pain    Panic attacks       Objective:    Wts  05/11/2024               289  01/08/2024             305  07/02/2023             304 12/10/2022             304 08/19/2022             308 02/25/2022             298  08/20/2021           311   03/13/2021              321  05/11/20 (!) 320 lb (145.2 kg)  09/16/19 (!) 315 lb (142.9 kg)  07/03/19 (!) 324 lb 8 oz (147.2 kg)  Vital signs reviewed  05/11/2024  - Note at rest 02 sats  92% on RA   General appearance:    amb mod obese (by bmi) walks with rollator and sits in it  in exam room /   HEENT : Oropharynx  clar   Nasal turbinates nl    NECK :  without  apparent JVD/ palpable Nodes/TM    LUNGS: no acc muscle use,  Mild barrel  contour chest wall with bilateral  Distant bs s audible wheeze and  without cough on insp or exp maneuvers  and mild  Hyperresonant  to  percussion bilaterally     CV:  IRIR no s3 or murmur or increase in P2, and no edema   ABD:  quite obese soft and nontender    MS ext warm without deformities Or obvious joint restrictions  calf tenderness, cyanosis or clubbing     SKIN: warm and dry without lesions    NEURO:  alert, approp, nl sensorium with  no motor or cerebellar deficits apparent.  Resting tremor more pronounced         Assessment

## 2024-05-11 ENCOUNTER — Encounter: Payer: Self-pay | Admitting: Internal Medicine

## 2024-05-11 ENCOUNTER — Ambulatory Visit (INDEPENDENT_AMBULATORY_CARE_PROVIDER_SITE_OTHER): Admitting: Internal Medicine

## 2024-05-11 VITALS — BP 122/72 | HR 93 | Ht 72.0 in | Wt 289.4 lb

## 2024-05-11 DIAGNOSIS — Z87891 Personal history of nicotine dependence: Secondary | ICD-10-CM

## 2024-05-11 DIAGNOSIS — J449 Chronic obstructive pulmonary disease, unspecified: Secondary | ICD-10-CM

## 2024-05-11 DIAGNOSIS — J9611 Chronic respiratory failure with hypoxia: Secondary | ICD-10-CM | POA: Diagnosis not present

## 2024-05-11 MED ORDER — BUDESONIDE 0.5 MG/2ML IN SUSP: 0.5000 mg | Freq: Two times a day (BID) | RESPIRATORY_TRACT | 11 refills | Status: AC

## 2024-05-11 MED ORDER — ALBUTEROL SULFATE HFA 108 (90 BASE) MCG/ACT IN AERS: 1.0000 | INHALATION_SPRAY | RESPIRATORY_TRACT | 11 refills | Status: AC | PRN

## 2024-05-11 MED ORDER — ALBUTEROL SULFATE 1.25 MG/3ML IN NEBU: INHALATION_SOLUTION | RESPIRATORY_TRACT | 11 refills | Status: AC

## 2024-05-11 NOTE — Assessment & Plan Note (Signed)
 Placed on 02 by Vonzell ? when - ONO RA 05/17/20  desat < 89% x 1 h 44 min  So rec continue noct 02 and titrate daytime to keep > 90%  -  08/31/2020   Walked RA  approx   200 ft  @ avg pace  stopped due to  Sob with sats 93% - 08/20/2021   Walked on RA x  3  lap(s) =  approx 525 ft  @  moderate pace with rollator, stopped due to sob x 2  with lowest 02 sats 96%  - 08/19/2022 patient walked 300 ft  walker assist on 3LO2 cont. Stopped after lap 2 due to being tired and SOB. Lowest sats = 94%  - 08/30/22 ONO RA desats x 1 h 46 min < 89%    -  09/16/22  ONO on 2lpm with < 5 min at less than 89% so no changes needed  - 07/02/2023   Walked on 3lpm pulsed    x  2  lap(s) =  approx 300  ft  @ slow pace, stopped due to fatigue with lowest 02 sats 95% and no sob    Again advised: 2lpm at hs and prn with activity: Make sure you check your oxygen  saturation  AT  your highest level of activity (not after you stop)   to be sure it stays over 90% and adjust  02 flow upward to maintain this level if needed but remember to turn it back to previous settings when you stop (to conserve your supply).   F/u q 3 m sooner prn with all meds in hand using a trust but verify approach to confirm accurate Medication  Reconciliation The principal here is that until we are certain that the  patients are doing what we've asked, it makes no sense to ask them to do more.          Each maintenance medication was reviewed in detail including emphasizing most importantly the difference between maintenance and prns and under what circumstances the prns are to be triggered using an action plan format where appropriate.  Total time for H and P, chart review, counseling, reviewing hfa/dpi/ neb/ 02 /pulse ox  device(s) and generating customized AVS unique to this office visit / same day charting = 41 min post hosp f/u/ transition of care with major medication changes required to prevent adverse outcome.

## 2024-05-11 NOTE — Assessment & Plan Note (Signed)
 Quit smoking 2017  - PFT's  05/11/2020  FEV1 1.32 (33 % ) ratio 0.42  p 25 % improvement from saba p ? 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 08/31/2020    MM   Level 147  - 08/19/2022  After extensive coaching inhaler device,  effectiveness =   80% with hfa and DPI > trial of trelegy 100 at pt request  - LDSCT 11/18/22 Mild diffuse bronchial wall thickening with mild centrilobular and paraseptal emphysema - 01/08/2024  After extensive coaching inhaler device,  effectiveness =    60^% (short Ti)  on hfa/ 80% on DPI (poor insp flows) > continue trelegy 100/ prn sba hfa > neb as plan C  - 05/11/2024  After extensive coaching inhaler device,  effectiveness =    80% hfa  but overusing multiple inhalers so simplify to maint  = alb 1.25/ bud 0.5  bid and prn saba   Overuse of sympathomimetics may be resulting in increased tremor and afib so rec simplify as above and use the pulmocort 0.5 mg / alb 1.25 mg as maint (he's convinced it works the best) and stop the trelegy and extra saba unless needed as a back up only

## 2024-05-11 NOTE — Patient Instructions (Addendum)
 Plan A = Automatic = Always=   Albuterol  1.25 mg / budesonide 0.5 first thing am and 12 hours later  Plan B = Backup (to supplement plan A, not to replace it) Only use your albuterol  nebulizer treatments in between  Plan A up to every 4 hours as needed   Only use your albuterol  inhaler as a rescue medication when you are not at home to be used if you can't catch your breath by resting or doing a relaxed purse lip breathing pattern.  - The less you use it, the better it will work when you need it. - Ok to use up to 2 puffs  every 4 hours if you must but call for immediate appointment if use goes up over your usual need - Don't leave home without it !!  (think of it like the spare tire for your car)   Please schedule a follow up visit in 3 months but call sooner if needed  with all medications /inhalers/ solutions in hand so we can verify exactly what you are taking. This includes all medications from all doctors and over the counters

## 2024-05-13 DIAGNOSIS — R32 Unspecified urinary incontinence: Secondary | ICD-10-CM | POA: Diagnosis not present

## 2024-05-13 DIAGNOSIS — G8929 Other chronic pain: Secondary | ICD-10-CM | POA: Diagnosis not present

## 2024-05-13 DIAGNOSIS — I509 Heart failure, unspecified: Secondary | ICD-10-CM | POA: Diagnosis not present

## 2024-05-13 DIAGNOSIS — Z7951 Long term (current) use of inhaled steroids: Secondary | ICD-10-CM | POA: Diagnosis not present

## 2024-05-13 DIAGNOSIS — E119 Type 2 diabetes mellitus without complications: Secondary | ICD-10-CM | POA: Diagnosis not present

## 2024-05-13 DIAGNOSIS — Z7901 Long term (current) use of anticoagulants: Secondary | ICD-10-CM | POA: Diagnosis not present

## 2024-05-13 DIAGNOSIS — I4891 Unspecified atrial fibrillation: Secondary | ICD-10-CM | POA: Diagnosis not present

## 2024-05-13 DIAGNOSIS — J9611 Chronic respiratory failure with hypoxia: Secondary | ICD-10-CM | POA: Diagnosis not present

## 2024-05-13 DIAGNOSIS — J441 Chronic obstructive pulmonary disease with (acute) exacerbation: Secondary | ICD-10-CM | POA: Diagnosis not present

## 2024-05-14 DIAGNOSIS — J449 Chronic obstructive pulmonary disease, unspecified: Secondary | ICD-10-CM | POA: Diagnosis not present

## 2024-05-14 DIAGNOSIS — E1165 Type 2 diabetes mellitus with hyperglycemia: Secondary | ICD-10-CM | POA: Diagnosis not present

## 2024-05-17 DIAGNOSIS — H5203 Hypermetropia, bilateral: Secondary | ICD-10-CM | POA: Diagnosis not present

## 2024-05-17 DIAGNOSIS — H524 Presbyopia: Secondary | ICD-10-CM | POA: Diagnosis not present

## 2024-05-17 DIAGNOSIS — H52223 Regular astigmatism, bilateral: Secondary | ICD-10-CM | POA: Diagnosis not present

## 2024-05-24 ENCOUNTER — Ambulatory Visit (INDEPENDENT_AMBULATORY_CARE_PROVIDER_SITE_OTHER): Admitting: Internal Medicine

## 2024-05-24 ENCOUNTER — Ambulatory Visit: Payer: Self-pay

## 2024-05-24 DIAGNOSIS — J449 Chronic obstructive pulmonary disease, unspecified: Secondary | ICD-10-CM

## 2024-05-24 NOTE — Progress Notes (Signed)
 Stephen Warren, male    DOB: [birth year]     MRN: [id number]   Brief patient profile:  68  yowm from [place]  MM/quit smoking 2019  with h/o asthma all his life could never play sports using inhalers and nebs daily and worse 2006 met criteria for disability 2012 and arrived in [place] since 2014  And breathing worse even when quit smoking at wt  270 and followed by Vonzell so referred to pulmonary clinic 05/11/2020 by Dr  Carlette     History of Present Illness  05/11/2020  Pulmonary/ 1st office eval/Stephen Warren on symbicort and spiriva  Chief Complaint  Patient presents with   Pulmonary Consult    Referred by Dr Carlette. Former Dr Vonzell pt.    Dyspnea:  Able to do 10 - 15 minutes in cooler air Cough: non Sleep: in recliner 30 degrees  SABA use: using hfa and neb 4 x daily  02 prn daytime  Up to 4lpm pulsed and sometimes checks sats and finds them usually 88 or above but not usually checking with ex  rec We will order Overnight pulse oximetry on Room air and let you know how you did  Make sure you check your oxygen  saturations at highest level of activity Plan A = Automatic = Always=    breztri  Take 2 puffs first thing in am and then another 2 puffs about 12 hours later.  Work on inhaler technique:  Plan B = Backup (to supplement plan A, not to replace it) Only use your albuterol  inhaler as a rescue medication Plan C = Crisis (instead of Plan B but only if Plan B stops working) - only use your albuterol  nebulizer if you first try Plan B Try albuterol  15 min before an activity that you know would make you short of breath and see if it makes any difference and if makes none then don't take it after activity unless you can't catch your breath.        08/31/2020  f/u ov/[place] office/Stephen Warren re: GOLD III / breztri  maint/ 02 dep hs and ex  Chief Complaint  Patient presents with   Follow-up    Breathing has been worse for the past month. He is SOB walking accross the room.   Dyspnea:  In cool weather  does better still not having to stop walking every 1.5 aisle  150 ft to mb is flat stop 3/4  never checks 02 - doesn't use it either unless gives out  Cough:  Dry cough daytime  Sleeping: on side bed flat/ 2 pillows  SABA use: using albuterol  hfa qid  02: sitting still no 02  /2 liters at hs and pulsing on 2lpm with ex but not monitoring as rec  Worse for the last month with sneezing/ dry cough daytime  rec GERD diet   Protonix  40 mg Take 30-60 min before first meal of the day  Prednisone  10 mg take  4 each am x 2 days,   2 each am x 2 days,  1 each am x 2 days and stop (don't take until after blood test) Try albuterol  15 min before an activity that you know would make you short of breath      Make sure you check your oxygen  saturations at highest level of activity to be sure it stays over 90%   - Allergy profile 08/31/2020 >  Eos 0.2 /  IgE  1326 - Alpha 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 Using 3.5LO2  Dyspnea:  mb and back x 150 ft x flat with rollator and 02 x 3lpm but not checking  Cough: minimal attributes to pnds Sleeping: flat bed, 3 pillows  SABA use: using neb each am  instead of breztri  / hfa for [employer]  02: 3lpm hs wakes up feeling good no ha or hypersomnolence or head fog Rec Plan A = Automatic = Always=    Breztri  or Trelegy  Work on inhaler technique:  Plan B = Backup (to supplement plan A, not to replace it) Only use your albuterol  inhaler as a rescue medication  Plan C = Crisis (instead of Plan B but only if Plan B stops working) - only use your albuterol  nebulizer if you first try Plan B  My office will be contacting you by phone for referral for Overnight 02 sats on RA  Make sure you check your oxygen  saturation  AT  your highest level of activity (not after you  stop)   to be sure it stays over 90%  -  08/30/22 ONO RA desats x 1 h 46 min < 89%    -  09/16/22   ONO on 2lpm with < 5 min at less than 89% so no changes needed    07/02/2023  f/u ov/[place] office/Stephen Warren re: GOLD 3 02 dep maint on trelegy 100  and  singulair   Chief Complaint  Patient presents with   COPD    Gold 3  Dyspnea:  mb and back with rollator maybe a quarter mile flat NOT checking sats  Cough: none / some pnds/ sneezing  Sleeping: flat bed/ 3 pillows s resp cc  SABA use:  the same as on trelegy  02: 2lpm and 2.5 walking but not really titrating Lung cancer screening: Jan 2024  Rec Make sure you check your oxygen  saturation  AT  your highest level of activity (not after you stop)   to be sure it stays over 90% and adjust  02 flow upward to maintain this level if needed but remember to turn it back to previous settings when you stop (to conserve your supply). Also  Ok to try albuterol  15 min before an activity (on alternating days between the inhaler, the nebulizer, then nothing )  that you know would usually make you short of breath (Mailbox and back)   01/08/2024  f/u ov/[place] office/Stephen Warren re: GOLD 3 copd /02 dep maint on trelegy   Chief Complaint  Patient presents with   Follow-up    6 month follow up  Dyspnea:  mb and back quarter mile flat / 02 2lpm not stopping  Cough: none  Sleeping: flat bed  wedge s   resp cc  SABA use: neb 4 x daily / rare hfa  02: 2lpm 24/7  Lung cancer screening: Nov 21 2023 = RADs 2 Rec Plan A = Automatic = Always=   Trelegy 100 each am  Work on inhaler technique:   Plan B = Backup (to supplement plan A, not to replace it) Only use your albuterol  inhaler as a rescue medication  Plan C = Crisis (instead of Plan B but only if Plan B stops working) - only use your albuterol  nebulizer if you first try Plan B  Make sure  you check your oxygen  saturation  AT  your highest level of activity (not after you stop)   to be sure it stays over  90% Please schedule a follow up visit in 12 months but call sooner if needed   Discharge date: Mar 27, 2024 Length of stay: LOS: 3 days       Hospital course based on timeline of significant events after admission (by date): Please refer to H&P for full details. In summary, the patient has a history significant for COPD, chronic respiratory failure on home oxygen , type 2 diabetes mellitus and seasonal allergies who presented to the emergency department for worsening shortness of breath. Patient reported gradual worsening over 1 to 2 weeks. Patient is O2 dependent at home 2 to 3 L at baseline.  Patient was admitted for new onset of atrial fibrillation RVR, acute CHF likely secondary to new onset of A-fib, volume overload, and COPD exacerbation. Cardiology was consulted and we appreciate their input. Patient was started on diltiazem for rate control in the ER, then transitioned to oral diltiazem and Eliquis for anticoagulation for stroke prevention. Patient's CHA2DS2-VASc score is 2. Patient clinically improved and rate controlled. Patient will be set up for Zio patch prior to discharge home and I have discussed outpatient sleep study with the patient to rule out OSA which will need to be initiated by his PCP outpatient.   Extensive education provided at time of discharge regarding patient's medications. Patient will need to increase Lasix to 40 mg p.o. daily. I will start him on a low-dose of potassium to prevent hypokalemia, patient advised to weigh himself daily and instructions given on when to contact cardiology/PCP. Patient will need close outpatient follow-up with [facility] cardiology [place]. I have also recommended that he follow-up with his primary care doctor for follow-up BMP to follow-up on his renal function and potassium.  Chest x-ray consistent with volume overload. Patient was treated with IV Lasix with improvement in his symptoms. Patient was euvolemic day of discharge. Patient was on 2 L of  oxygen  nasal cannula at the time of discharge.  Patient was treated with empiric IV antibiotics with ceftriaxone and Zithromax  for his COPD exacerbation and DuoNebs. Leukocytosis resolved at time of discharge and was likely secondary to steroids given in the ER. Follows with [facility] pulmonology Dr. Darlean.   Patient is a DNR/DNI and reports he has paperwork at home. Case management arranging transportation and medications at time of discharge.   ______________________________________ Admission HPI   Patient admitted on: 03/24/2024 12:13 PM  Patient admitted by: Stephen Charlyne Seidel, MD  CHIEF COMPLAINT: Shortness of breath  Day of admission HPI: 68 year old male with history of COPD, chronic respiratory failure on home oxygen , type 2 diabetes and seasonal allergies who was sent over here from his allergy clinic for worsening shortness of breath. Patient reports shortness of breath for the past 1 to 2 weeks which has gradually worsened to the extent that he cannot go about his usual business. Home pulse oximetry revealed gradually decreasing pulse ox, starting about 91% a week and a half ago, decreasing to 88% and 64% today at his allergist office. He admits associated cough with scant sputum as well as profound weakness.  When he arrived in the emergency room, patient was noted with pulse of 111, was found to be in A-fib with RVR. O2 saturation was 90%. EKG confirmed A-fib, chest x-ray revealed mild cardiomegaly with central vascular congestion and interstitial thickening.This labs showed WBC of 11.7 and a  proBNP of 1302. Lactic acid was 2.5. ABG revealed pH of 7.48, PO262 and PO2 of 39.6. Bicarb is 29.  Patient admitted on Home O2? - yes Patient on home anticoagulant? - no Patient admitted with Chronic home foley catheter? - no Foley catheter placed or replaced by another service prior to admission? - no  Mental Status on Admission: The patient is Alert and oriented to PERSON The patient is  Alert And oriented to TIME The patient is Alert and oriented to LOCATION   Problem List, Assessment & Plan   ASSESSMENT & PLAN (In order of descending acuity)  New onset atrial fibrillation  Overview: - Etiology likely to be COPD - Patient has been loaded with diltiazem in the emergency room and transition to p.o. - Cardiology consulted and we appreciate their input - Echo completed, EF estimated greater than 55% with no wall motion abnormality - Continue Cardizem 240 mg CD p.o. daily, Eliquis 5 mg p.o. twice daily - Zio patch 14-day prior to discharge home - Outpatient sleep study to rule out OSA  Chronic obstructive pulmonary disease with acute exacerbation  Overview: - DuoNeb every 6 hourly - Budesonide  twice daily - Patient treated with IV Rocephin and azithromycin . Discharge with a 5-day course of doxycycline. - baseline home O2 2-3 L [place]  Abnormal renal function Likely secondary to diuresis Discontinue IV Lasix and transition to p.o.  Volume overload Euvolemic on exam Lasix 40 mg p.o. daily starting 03/27/2024  Acute congestive heart failure  Overview: - Etiology is likely new onset A-fib - proBNP is 1300, no old values available - cardiology following   D/c at  298 lb   05/11/2024  f/u ov/[facility] office/Stephen Warren re: GOLD 3/ 02 dep maint on mutliple same meds (trlegy plus pulmicort  plus alb neb and hfa   Chief Complaint  Patient presents with   Follow-up   COPD  Dyspnea:  much less active  Cough: none  Sleeping: flat bed/ wedge pillow R side down best position      SABA use: way too much with tremors noted  02: 2 lpm hs  Rec Plan A = Automatic = Always=   Albuterol  1.25 mg / budesonide  0.5 first thing am and 12 hours later Plan B = Backup (to supplement plan A, not to replace it) Only use your albuterol  nebulizer treatments in between  Plan A up to every 4 hours as needed  Only use your albuterol  inhaler as a rescue medication  Please schedule a follow up visit  in 3 months but call sooner if needed  with all medications /inhalers/ solutions in hand so we can verify exactly what you are taking. This includes all medications from all doctors and over the counters     05/24/2024 attempted video visit repeatedly x 15 min and not able to raise on phone either so cancelled appt.   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

## 2024-05-24 NOTE — Telephone Encounter (Signed)
 Pt never joined the virtual visit  I called him multiple times and went to voicemail each time  I left him a msg to call back about rescheduling visit

## 2024-05-24 NOTE — Assessment & Plan Note (Signed)
Attempted video visit.

## 2024-05-24 NOTE — Telephone Encounter (Signed)
 Copied from CRM [id number]. Topic: Clinical - Red Word Triage >> May 24, 2024  9:50 AM Rilla NOVAK wrote: [facility] that prompted transfer to Nurse Triage: Hard time breathing Reason for Disposition  [1] Longstanding difficulty breathing (e.g., CHF, COPD, emphysema) AND [2] WORSE than normal  Answer Assessment - Initial Assessment Questions E2C2 Pulmonary Triage - Initial Assessment Questions  1. Chief Complaint (e.g., cough, sob, wheezing, fever, chills, sweat or additional symptoms) *Go to specific symptom protocol after initial questions. SOB upon exertion, patient able to speak in clear and complete sentences while on phone with this RN    2. Have you used any OTC meds to help with symptoms? If yes, ask What medications? Albuterol  and Pulmicort     3. Do you wear supplemental oxygen ? If yes, How many liters are you supposed to use? On 2.5 L at this time, states 2.5 L is typical for him   4. Do you monitor your oxygen  levels? If yes, What is your reading (oxygen  level) today? 92-93%   5. What is your usual oxygen  saturation reading?  (Note: Pulmonary O2 sats should be 90% or greater) 92-93%    1. RESPIRATORY STATUS: Describe your breathing? (e.g., wheezing, shortness of breath, unable to speak, severe coughing)      Shortness of breath, getting winded quick 2. ONSET: When did this breathing problem begin?      A few days after OV on 05/11/24 4. SEVERITY: How bad is your breathing? (e.g., mild, moderate, severe)      Mild  7. LUNG HISTORY: Do you have any history of lung disease?  (e.g., pulmonary embolus, asthma, emphysema)     COPD 8. CAUSE: What do you think is causing the breathing problem?      States he was advised to stop Symbicort at OV on 05/11/24 9. OTHER SYMPTOMS: Do you have any other symptoms? (e.g., chest pain, cough, dizziness, fever, runny nose)     Productive cough that onset a few days to a week ago    States Symbicort was stopped and  Pulmicort  was started at OV on 05/11/24. Patient is inquiring if symptoms could be related to this medication change.   States he has been trying to get an aide at home for assistance and request was denied. Requesting proof or note from doctor that he needs assistance.  Protocols used: Breathing Difficulty-A-AH

## 2024-05-31 DIAGNOSIS — Z7901 Long term (current) use of anticoagulants: Secondary | ICD-10-CM | POA: Diagnosis not present

## 2024-05-31 DIAGNOSIS — R32 Unspecified urinary incontinence: Secondary | ICD-10-CM | POA: Diagnosis not present

## 2024-05-31 DIAGNOSIS — J9611 Chronic respiratory failure with hypoxia: Secondary | ICD-10-CM | POA: Diagnosis not present

## 2024-05-31 DIAGNOSIS — Z7951 Long term (current) use of inhaled steroids: Secondary | ICD-10-CM | POA: Diagnosis not present

## 2024-05-31 DIAGNOSIS — E119 Type 2 diabetes mellitus without complications: Secondary | ICD-10-CM | POA: Diagnosis not present

## 2024-05-31 DIAGNOSIS — J441 Chronic obstructive pulmonary disease with (acute) exacerbation: Secondary | ICD-10-CM | POA: Diagnosis not present

## 2024-05-31 DIAGNOSIS — I4891 Unspecified atrial fibrillation: Secondary | ICD-10-CM | POA: Diagnosis not present

## 2024-05-31 DIAGNOSIS — G8929 Other chronic pain: Secondary | ICD-10-CM | POA: Diagnosis not present

## 2024-05-31 DIAGNOSIS — I509 Heart failure, unspecified: Secondary | ICD-10-CM | POA: Diagnosis not present

## 2024-06-11 DIAGNOSIS — Z9981 Dependence on supplemental oxygen: Secondary | ICD-10-CM | POA: Diagnosis not present

## 2024-06-11 DIAGNOSIS — R0602 Shortness of breath: Secondary | ICD-10-CM | POA: Diagnosis not present

## 2024-06-11 DIAGNOSIS — I509 Heart failure, unspecified: Secondary | ICD-10-CM | POA: Diagnosis not present

## 2024-06-11 DIAGNOSIS — I4891 Unspecified atrial fibrillation: Secondary | ICD-10-CM | POA: Diagnosis not present

## 2024-06-11 DIAGNOSIS — Z515 Encounter for palliative care: Secondary | ICD-10-CM | POA: Diagnosis not present

## 2024-06-11 DIAGNOSIS — I10 Essential (primary) hypertension: Secondary | ICD-10-CM | POA: Diagnosis not present

## 2024-06-11 DIAGNOSIS — J449 Chronic obstructive pulmonary disease, unspecified: Secondary | ICD-10-CM | POA: Diagnosis not present

## 2024-06-11 DIAGNOSIS — J9610 Chronic respiratory failure, unspecified whether with hypoxia or hypercapnia: Secondary | ICD-10-CM | POA: Diagnosis not present

## 2024-06-14 DIAGNOSIS — E1165 Type 2 diabetes mellitus with hyperglycemia: Secondary | ICD-10-CM | POA: Diagnosis not present

## 2024-06-14 DIAGNOSIS — J449 Chronic obstructive pulmonary disease, unspecified: Secondary | ICD-10-CM | POA: Diagnosis not present

## 2024-07-08 DIAGNOSIS — Z7984 Long term (current) use of oral hypoglycemic drugs: Secondary | ICD-10-CM | POA: Diagnosis not present

## 2024-07-08 DIAGNOSIS — Z0000 Encounter for general adult medical examination without abnormal findings: Secondary | ICD-10-CM | POA: Diagnosis not present

## 2024-07-08 DIAGNOSIS — Z79899 Other long term (current) drug therapy: Secondary | ICD-10-CM | POA: Diagnosis not present

## 2024-07-08 DIAGNOSIS — J449 Chronic obstructive pulmonary disease, unspecified: Secondary | ICD-10-CM | POA: Diagnosis not present

## 2024-07-08 DIAGNOSIS — I4819 Other persistent atrial fibrillation: Secondary | ICD-10-CM | POA: Diagnosis not present

## 2024-07-08 DIAGNOSIS — I509 Heart failure, unspecified: Secondary | ICD-10-CM | POA: Diagnosis not present

## 2024-07-08 DIAGNOSIS — Z7901 Long term (current) use of anticoagulants: Secondary | ICD-10-CM | POA: Diagnosis not present

## 2024-07-08 DIAGNOSIS — E119 Type 2 diabetes mellitus without complications: Secondary | ICD-10-CM | POA: Diagnosis not present

## 2024-07-08 DIAGNOSIS — I4891 Unspecified atrial fibrillation: Secondary | ICD-10-CM | POA: Diagnosis not present

## 2024-07-15 DIAGNOSIS — E1165 Type 2 diabetes mellitus with hyperglycemia: Secondary | ICD-10-CM | POA: Diagnosis not present

## 2024-07-15 DIAGNOSIS — J449 Chronic obstructive pulmonary disease, unspecified: Secondary | ICD-10-CM | POA: Diagnosis not present

## 2024-07-20 DIAGNOSIS — I4891 Unspecified atrial fibrillation: Secondary | ICD-10-CM | POA: Diagnosis not present

## 2024-07-20 DIAGNOSIS — I10 Essential (primary) hypertension: Secondary | ICD-10-CM | POA: Diagnosis not present

## 2024-07-20 DIAGNOSIS — Z515 Encounter for palliative care: Secondary | ICD-10-CM | POA: Diagnosis not present

## 2024-07-20 DIAGNOSIS — J9610 Chronic respiratory failure, unspecified whether with hypoxia or hypercapnia: Secondary | ICD-10-CM | POA: Diagnosis not present

## 2024-07-20 DIAGNOSIS — Z9981 Dependence on supplemental oxygen: Secondary | ICD-10-CM | POA: Diagnosis not present

## 2024-07-20 DIAGNOSIS — R0602 Shortness of breath: Secondary | ICD-10-CM | POA: Diagnosis not present

## 2024-07-20 DIAGNOSIS — I509 Heart failure, unspecified: Secondary | ICD-10-CM | POA: Diagnosis not present

## 2024-07-20 DIAGNOSIS — J449 Chronic obstructive pulmonary disease, unspecified: Secondary | ICD-10-CM | POA: Diagnosis not present

## 2024-07-21 DIAGNOSIS — F331 Major depressive disorder, recurrent, moderate: Secondary | ICD-10-CM | POA: Diagnosis not present

## 2024-08-14 DIAGNOSIS — J449 Chronic obstructive pulmonary disease, unspecified: Secondary | ICD-10-CM | POA: Diagnosis not present

## 2024-08-14 DIAGNOSIS — E1165 Type 2 diabetes mellitus with hyperglycemia: Secondary | ICD-10-CM | POA: Diagnosis not present

## 2024-08-16 ENCOUNTER — Encounter: Payer: Self-pay | Admitting: Internal Medicine

## 2024-08-16 ENCOUNTER — Ambulatory Visit: Admitting: Internal Medicine

## 2024-08-16 VITALS — BP 114/68 | HR 77 | Ht 72.0 in | Wt 303.0 lb

## 2024-08-16 DIAGNOSIS — J449 Chronic obstructive pulmonary disease, unspecified: Secondary | ICD-10-CM | POA: Diagnosis not present

## 2024-08-16 DIAGNOSIS — J9611 Chronic respiratory failure with hypoxia: Secondary | ICD-10-CM | POA: Diagnosis not present

## 2024-08-16 NOTE — Patient Instructions (Signed)
 Plan A = Automatic = Always=    trelegy 100 one click 1st thing in am  Plan B = Backup (to supplement plan A, not to replace it) Use your albuterol  -budesonide   a rescue medication to be used if you can't catch your breath by resting or slowing your pace  or doing a relaxed purse lip breathing pattern.  - The less you use it, the better it will work when you need it. - Ok to use the inhaler up every 4 hours  if you must but call for appointment if use goes up over your usual need     Make sure you check your oxygen  saturation  AT  your highest level of activity (not after you stop)   to be sure it stays over 90% and adjust  02 flow upward to maintain this level if needed but remember to turn it back to previous settings when you stop   Also  Ok to try albuterol  15 min before an activity (on alternating days)  that you know would usually make you short of breath and see if it makes any difference and if makes none then don't take albuterol  after activity unless you can't catch your breath as this means it's the resting that helps, not the albuterol .       Please schedule a follow up visit in  6 months but call sooner if needed

## 2024-08-16 NOTE — Assessment & Plan Note (Addendum)
 Quit smoking 2017  - PFT's  05/11/2020  FEV1 1.32 (33 % ) ratio 0.42  p 25 % improvement from saba p ? 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 08/31/2020    MM   Level 147  - 08/19/2022  After extensive coaching inhaler device,  effectiveness =   80% with hfa and DPI > trial of trelegy 100 at pt request  - LDSCT 11/18/22 Mild diffuse bronchial wall thickening with mild centrilobular and paraseptal emphysema - 01/08/2024  After extensive coaching inhaler device,  effectiveness =    60^% (short Ti)  on hfa/ 80% on DPI (poor insp flows) > continue trelegy 100/ prn sba hfa > neb as plan C  - 05/11/2024  After extensive coaching inhaler device,  effectiveness =    80% hfa  but overusing multiple inhalers so simplify to maint  = alb 1.25/ bud 0.5  bid and prn saba  - 08/16/2024 changed back to trelegy 100 daily and prn alb/ bud as plan B (equivalent of airsupra)    Group E in terms of symptoms/risk so  laba/lama/ICS  therefore appropriate rx at this point >>>  trelegy 100   and approp SABA:  Re SABA :  I spent extra time with pt today reviewing appropriate use of albuterol  for prn use on exertion with the following points: 1) saba is for relief of sob that does not improve by walking a slower pace or resting but rather if the pt does not improve after trying this first. 2) If the pt is convinced, as many are, that saba helps recover from activity faster then it's easy to tell if this is the case by re-challenging : ie stop, take the inhaler, then p 5 minutes try the exact same activity (intensity of workload) that just caused the symptoms and see if they are substantially diminished or not after saba 3) if there is an activity that reproducibly causes the symptoms, try the saba 15 min before the activity on alternate  days   If in fact the saba really does help, then fine to continue to use it prn but advised may need to look closer at the maintenance regimen being used to achieve better control of airways disease with exertion.

## 2024-08-16 NOTE — Progress Notes (Signed)
 Stephen Warren, male    DOB: 08-04-1956     MRN: [id number]   Brief patient profile:  67  yowm from [place]  MM/quit smoking 2019  with h/o asthma all his life could never play sports using inhalers and nebs daily and worse 2006 met criteria for disability 2012 and arrived in [place] since 2014  And breathing worse even when quit smoking at wt  89 and followed by Stephen Warren so referred to pulmonary clinic 05/11/2020 by Dr  Stephen Warren     History of Present Illness  05/11/2020  Pulmonary/ 1st office eval/Stephen Warren on symbicort and spiriva  Chief Complaint  Patient presents with   Pulmonary Consult    Referred by Dr Stephen Warren. Former Dr Stephen Warren pt.    Dyspnea:  Able to do 10 - 15 minutes in cooler air Cough: non Sleep: in recliner 30 degrees  SABA use: using hfa and neb 4 x daily  02 prn daytime  Up to 4lpm pulsed and sometimes checks sats and finds them usually 88 or above but not usually checking with ex  rec We will order Overnight pulse oximetry on Room air and let you know how you did  Make sure you check your oxygen  saturations at highest level of activity Plan A = Automatic = Always=    breztri  Take 2 puffs first thing in am and then another 2 puffs about 12 hours later.  Work on inhaler technique:  Plan B = Backup (to supplement plan A, not to replace it) Only use your albuterol  inhaler as a rescue medication Plan C = Crisis (instead of Plan B but only if Plan B stops working) - only use your albuterol  nebulizer if you first try Plan B Try albuterol  15 min before an activity that you know would make you short of breath and see if it makes any difference and if makes none then don't take it after activity unless you can't catch your breath.        08/31/2020  f/u ov/[place] office/Stephen Warren re: GOLD III / breztri  maint/ 02 dep hs and ex  Chief Complaint  Patient presents with   Follow-up    Breathing has been worse for the past month. He is SOB walking accross the room.   Dyspnea:  In cool weather  does better still not having to stop walking every 1.5 aisle  150 ft to mb is flat stop 3/4  never checks 02 - doesn't use it either unless gives out  Cough:  Dry cough daytime  Sleeping: on side bed flat/ 2 pillows  SABA use: using albuterol  hfa qid  02: sitting still no 02  /2 liters at hs and pulsing on 2lpm with ex but not monitoring as rec  Worse for the last month with sneezing/ dry cough daytime  rec GERD diet   Protonix  40 mg Take 30-60 min before first meal of the day  Prednisone  10 mg take  4 each am x 2 days,   2 each am x 2 days,  1 each am x 2 days and stop (don't take until after blood test) Try albuterol  15 min before an activity that you know would make you short of breath      Make sure you check your oxygen  saturations at highest level of activity to be sure it stays over 90%   - Allergy profile 08/31/2020 >  Eos 0.2 /  IgE  1326 - Alpha one AT phenotype 08/31/2020    MM   Level  147    -  08/30/22 ONO RA desats x 1 h 46 min < 89%    -  09/16/22   ONO on 2lpm with < 5 min at less than 89% so no changes needed       01/08/2024  f/u ov/[place] office/Stephen Warren re: GOLD 3 copd /02 dep maint on trelegy   Chief Complaint  Patient presents with   Follow-up    6 month follow up  Dyspnea:  mb and back quarter mile flat / 02 2lpm not stopping  Cough: none  Sleeping: flat bed  wedge s   resp cc  SABA use: neb 4 x daily / rare hfa  02: 2lpm 24/7  Lung cancer screening: Nov 21 2023 = RADs 2 Rec Plan A = Automatic = Always=   Trelegy 100 each am  Work on inhaler technique:   Plan B = Backup (to supplement plan A, not to replace it) Only use your albuterol  inhaler as a rescue medication  Plan C = Crisis (instead of Plan B but only if Plan B stops working) - only use your albuterol  nebulizer if you first try Plan B  Make sure you check your oxygen  saturation  AT  your highest level of activity (not after you stop)   to be sure it stays over 90% Please schedule a follow up  visit in 12 months but call sooner if needed     Patient admitted on: 03/24/2024 12:13 PM  CHIEF COMPLAINT: Shortness of breath  Day of admission HPI: 68 year old male with history of COPD, chronic respiratory failure on home oxygen , type 2 diabetes and seasonal allergies who was sent over here from his allergy clinic for worsening shortness of breath. Patient reports shortness of breath for the past 1 to 2 weeks which has gradually worsened to the extent that he cannot go about his usual business. Home pulse oximetry revealed gradually decreasing pulse ox, starting about 91% a week and a half ago, decreasing to 88% and 64% today at his allergist office. He admits associated cough with scant sputum as well as profound weakness.  When he arrived in the emergency room, patient was noted with pulse of 111, was found to be in A-fib with RVR. O2 saturation was 90%. EKG confirmed A-fib, chest x-ray revealed mild cardiomegaly with central vascular congestion and interstitial thickening.This labs showed WBC of 11.7 and a proBNP of 1302. Lactic acid was 2.5. ABG revealed pH of 7.48, PO262 and PO2 of 39.6. Bicarb is 29.    ASSESSMENT & PLAN (In order of descending acuity)  New onset atrial fibrillation  Overview: - Etiology likely to be COPD - Patient has been loaded with diltiazem in the emergency room and transition to p.o. - Cardiology consulted and we appreciate their input - Echo completed, EF estimated greater than 55% with no wall motion abnormality - Continue Cardizem 240 mg CD p.o. daily, Eliquis 5 mg p.o. twice daily - Zio patch 14-day prior to discharge home - Outpatient sleep study to rule out OSA  Chronic obstructive pulmonary disease with acute exacerbation  Overview: - DuoNeb every 6 hourly - Budesonide  twice daily - Patient treated with IV Rocephin and azithromycin . Discharge with a 5-day course of doxycycline. - baseline home O2 2-3 L Stephen Warren     D/c at  298 lb   05/11/2024  f/u  ov/[place] office/Stephen Warren re: GOLD 3/ 02 dep maint on mutliple same meds (trlegy plus pulmicort  plus alb neb and hfa   Chief  Complaint  Patient presents with   Follow-up   COPD  Dyspnea:  much less active  Cough: none  Sleeping: flat bed/ wedge pillow R side down best position      SABA use: way too much with tremors noted  02: 2 lpm hs  Rec Plan A = Automatic = Always=   Albuterol  1.25 mg / budesonide  0.5 first thing am and 12 hours later Plan B = Backup (to supplement plan A, not to replace it) Only use your albuterol  nebulizer treatments in between  Plan A up to every 4 hours as needed  Only use your albuterol  inhaler as a rescue medication  Please schedule a follow up visit in 3 months but call sooner if needed  with all medications /inhalers/ solutions in hand so we can verify exactly what you are taking. This includes all medications from all doctors and over the counters     08/16/2024  f/u ov/[place] office/Nimrit Kehres re: GOLD 3/ 02 dep maint on trelegy first thing AM Chief Complaint  Patient presents with   COPD    Shob / coughing   Dyspnea:  back and forth across appt several minutes at a time  Cough: minimal clear Sleeping: flat bed/ wedge or recliner s   resp cc  SABA use: bid bud/ qid albuterol  as back up since started back on trelegy 100 daily  02: 2lpm  and prn daytime.   Lung cancer screening: already in program   due q January    No obvious day to day or daytime variability or assoc excess/ purulent sputum or mucus plugs or hemoptysis or cp or chest tightness, subjective wheeze or overt sinus or hb symptoms.    Also denies any obvious fluctuation of symptoms with weather or environmental changes or other aggravating or alleviating factors except as outlined above   No unusual exposure hx or h/o childhood pna/ asthma or knowledge of premature birth.  Current Allergies, Complete Past Medical History, Past Surgical History, Family History, and Social History were  reviewed in [employer] record.  ROS  The following are not active complaints unless bolded Hoarseness, sore throat, dysphagia, dental problems, itching, sneezing,  nasal congestion or discharge of excess mucus or purulent secretions, ear ache,   fever, chills, sweats, unintended wt loss or wt gain, classically pleuritic or exertional cp,  orthopnea pnd or arm/hand swelling  or leg swelling, presyncope, palpitations, abdominal pain, anorexia, nausea, vomiting, diarrhea  or change in bowel habits or change in bladder habits, change in stools or change in urine, dysuria, hematuria,  rash, arthralgias, visual complaints, headache, numbness, weakness or ataxia or problems with walking or coordination,  change in mood or  memory.        Current Meds  Medication Sig   ACCU-CHEK GUIDE test strip    Accu-Chek Softclix Lancets lancets daily. for testing   albuterol  (ACCUNEB ) 1.25 MG/3ML nebulizer solution 1.25 mg twice daily with budesonide    albuterol  (VENTOLIN  HFA) 108 (90 Base) MCG/ACT inhaler Inhale 1-2 puffs into the lungs every 4 (four) hours as needed for wheezing or shortness of breath.   apixaban (ELIQUIS) 5 MG TABS tablet Take 5 mg by mouth 2 (two) times daily.   atorvastatin (LIPITOR) 20 MG tablet    azelastine  (ASTELIN ) 0.1 % nasal spray Place 1 spray into both nostrils 2 (two) times daily. Use in each nostril as directed   baclofen  (LIORESAL ) 20 MG tablet Take 20 mg by mouth 3 (three) times daily.  budesonide  (PULMICORT ) 0.5 MG/2ML nebulizer solution Take 2 mLs (0.5 mg total) by nebulization 2 (two) times daily.   busPIRone (BUSPAR) 5 MG tablet Take 5 mg by mouth 3 (three) times daily.   citalopram  (CELEXA ) 20 MG tablet Take 20 mg by mouth daily.   diltiazem (CARDIZEM CD) 240 MG 24 hr capsule Take 240 mg by mouth daily.   EPINEPHRINE  0.3 mg/0.3 mL IJ SOAJ injection Inject one dose intramuscularly for allergic reaction. May repeat one dose if needed after 5-15 minutes.  Proceed to the ER   fluticasone  (FLONASE ) 50 MCG/ACT nasal spray Place 1 spray into both nostrils daily.   furosemide (LASIX) 20 MG tablet Take 20 mg by mouth daily as needed.   levocetirizine ([name] ) 5 MG tablet Take 1 tablet (5 mg total) by mouth daily as needed for allergies (Can take an extra dose during flare ups.).   metFORMIN (GLUCOPHAGE) 500 MG tablet Take 500 mg by mouth 2 (two) times daily with a meal.   montelukast  (SINGULAIR ) 10 MG tablet Take 1 tablet (10 mg total) by mouth at bedtime.   pantoprazole  (PROTONIX ) 40 MG tablet TAKE 1 TABLET BY MOUTH DAILY 30-60 MINUTES BEFORE THE first meal of THE DAY   potassium chloride  (KLOR-CON ) 10 MEQ tablet Take 10 mEq by mouth daily.   tadalafil (CIALIS) 5 MG tablet Take 5 mg by mouth daily.   TRADJENTA 5 MG TABS tablet Take 5 mg by mouth daily.   traZODone  (DESYREL ) 100 MG tablet Take two (2) tablets by mouth at bedtime   TRELEGY ELLIPTA 100-62.5-25 MCG/ACT AEPB Inhale 1 puff into the lungs daily.          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   Hemorrhoids    History of kidney stones    Insomnia    Knee pain    Panic attacks       Objective:    Wts  05/11/2024               289  01/08/2024             305  07/02/2023             304 12/10/2022             304 08/19/2022             308 02/25/2022             298  08/20/2021           311   03/13/2021              321  05/11/20 (!) 320 lb (145.2 kg)  09/16/19 (!) 315 lb (142.9 kg)  07/03/19 (!) 324 lb 8 oz (147.2 kg)    Vital signs reviewed  08/16/2024  - Note at rest 02 sats  93% on 2.5lpm cont    General appearance:    MO (by bmi) amb (sitting on rollator wm nad   HEENT : Oropharynx  clear   Nasal turbinates nl    NECK :  without  apparent JVD/ palpable  Nodes/TM    LUNGS: no acc muscle use,  Mild barrel  contour chest wall with bilateral  Distant bs  s audible wheeze and  without cough on insp or exp maneuvers  and mild  Hyperresonant  to  percussion bilaterally     CV:  RRR  no s3 or murmur or increase in P2, and no edema   ABD:  soft and nontender   MS:  Nl gait/ ext warm without deformities Or obvious joint restrictions  calf tenderness, cyanosis or clubbing     SKIN: warm and dry without lesions    NEURO:  alert, approp, nl sensorium with  no motor or cerebellar deficits apparent.            Assessment   Assessment & Plan COPD GOLD III  Quit smoking 2017  - PFT's  05/11/2020  FEV1 1.32 (33 % ) ratio 0.42  p 25 % improvement from saba p ? 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 08/31/2020    MM   Level 147  - 08/19/2022  After extensive coaching inhaler device,  effectiveness =   80% with hfa and DPI > trial of trelegy 100 at pt request  - LDSCT 11/18/22 Mild diffuse bronchial wall thickening with mild centrilobular and paraseptal emphysema - 01/08/2024  After extensive coaching inhaler device,  effectiveness =    60^% (short Ti)  on hfa/ 80% on DPI (poor insp flows) > continue trelegy 100/ prn sba hfa > neb as plan C  - 05/11/2024  After extensive coaching inhaler device,  effectiveness =    80% hfa  but overusing multiple inhalers so simplify to maint  = alb 1.25/ bud 0.5  bid and prn saba  - 08/16/2024 changed back to trelegy 100 daily and prn alb/ bud as plan B (equivalent of airsupra)    Group E in terms of symptoms/risk so  laba/lama/ICS  therefore appropriate rx at this point >>>  trelegy 100   and approp SABA:  Re SABA :  I spent extra time with pt today reviewing appropriate use of albuterol  for prn use on exertion with the  following points: 1) saba is for relief of sob that does not improve by walking a slower pace or resting but rather if the pt does not improve after trying this first. 2) If the pt is convinced, as many are, that saba helps recover from activity faster then it's easy to tell if this is the case by re-challenging : ie stop, take the inhaler, then p 5 minutes try the exact same activity (intensity of workload) that just caused the symptoms and see if they are substantially diminished or not after saba 3) if there is an activity that reproducibly causes the symptoms, try the saba 15 min before the activity on alternate days   If in fact the saba really does help, then fine to continue to use it prn but advised may need to look closer at the maintenance regimen being used to achieve better control of airways disease with exertion.      Chronic respiratory failure with hypoxia (HCC) Placed on 02 by Hawkins ? when - ONO RA 05/17/20  desat < 89% x 1 h 44 min  So rec continue noct 02 and titrate daytime to keep > 90%  -  08/31/2020   Walked  RA  approx   200 ft  @ avg pace  stopped due to  Sob with sats 93% - 08/20/2021   Walked on RA x  3  lap(s) =  approx 525 ft  @  moderate pace with rollator, stopped due to sob x 2  with lowest 02 sats 96%  - 08/19/2022 patient walked 300 ft  walker assist on 3LO2 cont. Stopped after lap 2 due to being tired and SOB. Lowest sats = 94%  - 08/30/22 ONO RA desats x 1 h 46 min < 89%    -  09/16/22  ONO on 2lpm with < 5 min at less than 89% so no changes needed  - 07/02/2023   Walked on 3lpm pulsed    x  2  lap(s) =  approx 300  ft  @ slow pace, stopped due to fatigue with lowest 02 sats 95% and no sob    As of 08/16/2024 on 2.5pm 24/7 but advised ok to increase flow if needed to maint sats > 90% esp with exertion.         Each maintenance medication was reviewed in detail including emphasizing most importantly the difference between maintenance and prns and under what  circumstances the prns are to be triggered using an action plan format where appropriate.  Total time for H and P, chart review, counseling, reviewing dpi/ nebs/ 02 / pulse ox  device(s) and generating customized AVS unique to this office visit / same day charting = 32 min          AVS  Patient Instructions  Plan A = Automatic = Always=    trelegy 100 one click 1st thing in am  Plan B = Backup (to supplement plan A, not to replace it) Use your albuterol  -budesonide   a rescue medication to be used if you can't catch your breath by resting or slowing your pace  or doing a relaxed purse lip breathing pattern.  - The less you use it, the better it will work when you need it. - Ok to use the inhaler up every 4 hours  if you must but call for appointment if use goes up over your usual need     Make sure you check your oxygen  saturation  AT  your highest level of activity (not after you stop)   to be sure it stays over 90% and adjust  02 flow upward to maintain this level if needed but remember to turn it back to previous settings when you stop   Also  Ok to try albuterol  15 min before an activity (on alternating days)  that you know would usually make you short of breath and see if it makes any difference and if makes none then don't take albuterol  after activity unless you can't catch your breath as this means it's the resting that helps, not the albuterol .       Please schedule a follow up visit in  6 months but call sooner if needed    Ozell America, MD 08/16/2024

## 2024-08-16 NOTE — Assessment & Plan Note (Addendum)
 Placed on 02 by [place] ? when - ONO RA 05/17/20  desat < 89% x 1 h 44 min  So rec continue noct 02 and titrate daytime to keep > 90%  -  08/31/2020   Walked RA  approx   200 ft  @ avg pace  stopped due to  Sob with sats 93% - 08/20/2021   Walked on RA x  3  lap(s) =  approx 525 ft  @  moderate pace with rollator, stopped due to sob x 2  with lowest 02 sats 96%  - 08/19/2022 patient walked 300 ft  walker assist on 3LO2 cont. Stopped after lap 2 due to being tired and SOB. Lowest sats = 94%  - 08/30/22 ONO RA desats x 1 h 46 min < 89%    -  09/16/22  ONO on 2lpm with < 5 min at less than 89% so no changes needed  - 07/02/2023   Walked on 3lpm pulsed    x  2  lap(s) =  approx 300  ft  @ slow pace, stopped due to fatigue with lowest 02 sats 95% and no sob    As of 08/16/2024 on 2.5pm 24/7 but advised ok to increase flow if needed to maint sats > 90% esp with exertion.         Each maintenance medication was reviewed in detail including emphasizing most importantly the difference between maintenance and prns and under what circumstances the prns are to be triggered using an action plan format where appropriate.  Total time for H and P, chart review, counseling, reviewing dpi/ nebs/ 02 / pulse ox  device(s) and generating customized AVS unique to this office visit / same day charting = 32 min

## 2024-08-31 DIAGNOSIS — R0602 Shortness of breath: Secondary | ICD-10-CM | POA: Diagnosis not present

## 2024-08-31 DIAGNOSIS — Z515 Encounter for palliative care: Secondary | ICD-10-CM | POA: Diagnosis not present

## 2024-08-31 DIAGNOSIS — I10 Essential (primary) hypertension: Secondary | ICD-10-CM | POA: Diagnosis not present

## 2024-08-31 DIAGNOSIS — J9610 Chronic respiratory failure, unspecified whether with hypoxia or hypercapnia: Secondary | ICD-10-CM | POA: Diagnosis not present

## 2024-08-31 DIAGNOSIS — I509 Heart failure, unspecified: Secondary | ICD-10-CM | POA: Diagnosis not present

## 2024-08-31 DIAGNOSIS — F419 Anxiety disorder, unspecified: Secondary | ICD-10-CM | POA: Diagnosis not present

## 2024-08-31 DIAGNOSIS — Z9981 Dependence on supplemental oxygen: Secondary | ICD-10-CM | POA: Diagnosis not present

## 2024-08-31 DIAGNOSIS — J449 Chronic obstructive pulmonary disease, unspecified: Secondary | ICD-10-CM | POA: Diagnosis not present

## 2024-09-08 DIAGNOSIS — I5031 Acute diastolic (congestive) heart failure: Secondary | ICD-10-CM | POA: Diagnosis not present

## 2024-09-08 DIAGNOSIS — I4891 Unspecified atrial fibrillation: Secondary | ICD-10-CM | POA: Diagnosis not present

## 2024-09-08 DIAGNOSIS — I2510 Atherosclerotic heart disease of native coronary artery without angina pectoris: Secondary | ICD-10-CM | POA: Diagnosis not present

## 2024-09-08 DIAGNOSIS — Z6841 Body Mass Index (BMI) 40.0 and over, adult: Secondary | ICD-10-CM | POA: Diagnosis not present

## 2024-09-08 DIAGNOSIS — Z87891 Personal history of nicotine dependence: Secondary | ICD-10-CM | POA: Diagnosis not present

## 2024-09-08 DIAGNOSIS — I700 Atherosclerosis of aorta: Secondary | ICD-10-CM | POA: Diagnosis not present

## 2024-09-14 DIAGNOSIS — J449 Chronic obstructive pulmonary disease, unspecified: Secondary | ICD-10-CM | POA: Diagnosis not present

## 2024-09-14 DIAGNOSIS — E1165 Type 2 diabetes mellitus with hyperglycemia: Secondary | ICD-10-CM | POA: Diagnosis not present

## 2024-10-05 DIAGNOSIS — I5030 Unspecified diastolic (congestive) heart failure: Secondary | ICD-10-CM | POA: Diagnosis not present

## 2024-10-05 DIAGNOSIS — J449 Chronic obstructive pulmonary disease, unspecified: Secondary | ICD-10-CM | POA: Diagnosis not present

## 2024-10-05 DIAGNOSIS — I5032 Chronic diastolic (congestive) heart failure: Secondary | ICD-10-CM | POA: Diagnosis not present

## 2024-10-05 DIAGNOSIS — E119 Type 2 diabetes mellitus without complications: Secondary | ICD-10-CM | POA: Diagnosis not present

## 2024-10-05 DIAGNOSIS — J9610 Chronic respiratory failure, unspecified whether with hypoxia or hypercapnia: Secondary | ICD-10-CM | POA: Diagnosis not present

## 2024-10-05 DIAGNOSIS — Z95810 Presence of automatic (implantable) cardiac defibrillator: Secondary | ICD-10-CM | POA: Diagnosis not present

## 2024-10-05 DIAGNOSIS — Z6841 Body Mass Index (BMI) 40.0 and over, adult: Secondary | ICD-10-CM | POA: Diagnosis not present

## 2024-10-05 DIAGNOSIS — G4733 Obstructive sleep apnea (adult) (pediatric): Secondary | ICD-10-CM | POA: Diagnosis not present

## 2024-10-05 DIAGNOSIS — E7800 Pure hypercholesterolemia, unspecified: Secondary | ICD-10-CM | POA: Diagnosis not present

## 2024-10-05 DIAGNOSIS — Z9981 Dependence on supplemental oxygen: Secondary | ICD-10-CM | POA: Diagnosis not present

## 2024-10-05 DIAGNOSIS — I4891 Unspecified atrial fibrillation: Secondary | ICD-10-CM | POA: Diagnosis not present

## 2024-10-05 DIAGNOSIS — J432 Centrilobular emphysema: Secondary | ICD-10-CM | POA: Diagnosis not present

## 2024-10-05 DIAGNOSIS — Z79899 Other long term (current) drug therapy: Secondary | ICD-10-CM | POA: Diagnosis not present

## 2024-10-05 DIAGNOSIS — I4819 Other persistent atrial fibrillation: Secondary | ICD-10-CM | POA: Diagnosis not present

## 2024-10-05 DIAGNOSIS — J9611 Chronic respiratory failure with hypoxia: Secondary | ICD-10-CM | POA: Diagnosis not present

## 2024-10-06 DIAGNOSIS — J449 Chronic obstructive pulmonary disease, unspecified: Secondary | ICD-10-CM | POA: Diagnosis not present

## 2024-10-06 DIAGNOSIS — Z6841 Body Mass Index (BMI) 40.0 and over, adult: Secondary | ICD-10-CM | POA: Diagnosis not present

## 2024-10-06 DIAGNOSIS — G4733 Obstructive sleep apnea (adult) (pediatric): Secondary | ICD-10-CM | POA: Diagnosis not present

## 2024-10-06 DIAGNOSIS — F32A Depression, unspecified: Secondary | ICD-10-CM | POA: Diagnosis not present

## 2024-10-06 DIAGNOSIS — I5030 Unspecified diastolic (congestive) heart failure: Secondary | ICD-10-CM | POA: Diagnosis not present

## 2024-10-06 DIAGNOSIS — J432 Centrilobular emphysema: Secondary | ICD-10-CM | POA: Diagnosis not present

## 2024-10-06 DIAGNOSIS — I4891 Unspecified atrial fibrillation: Secondary | ICD-10-CM | POA: Diagnosis not present

## 2024-10-06 DIAGNOSIS — I2510 Atherosclerotic heart disease of native coronary artery without angina pectoris: Secondary | ICD-10-CM | POA: Diagnosis not present

## 2024-10-06 DIAGNOSIS — J9610 Chronic respiratory failure, unspecified whether with hypoxia or hypercapnia: Secondary | ICD-10-CM | POA: Diagnosis not present

## 2024-10-06 DIAGNOSIS — I5032 Chronic diastolic (congestive) heart failure: Secondary | ICD-10-CM | POA: Diagnosis not present

## 2024-10-06 DIAGNOSIS — E7800 Pure hypercholesterolemia, unspecified: Secondary | ICD-10-CM | POA: Diagnosis not present

## 2024-10-06 DIAGNOSIS — E119 Type 2 diabetes mellitus without complications: Secondary | ICD-10-CM | POA: Diagnosis not present

## 2024-10-06 DIAGNOSIS — I4819 Other persistent atrial fibrillation: Secondary | ICD-10-CM | POA: Diagnosis not present

## 2024-10-22 ENCOUNTER — Ambulatory Visit

## 2024-10-22 DIAGNOSIS — L603 Nail dystrophy: Secondary | ICD-10-CM

## 2024-10-22 DIAGNOSIS — B351 Tinea unguium: Secondary | ICD-10-CM

## 2024-10-22 NOTE — Progress Notes (Unsigned)
 Subjective:  Patient ID: Stephen Warren, male    DOB: [birth year],  MRN: [id number]  No chief complaint on file.   68 y.o. male presents with the above complaint. He states that he has had pain to the distal aspect of his b/l big toe nails for a few weeks. He denies any purulence, erythema, or other pedal pathology.   Review of Systems: Negative except as noted in the HPI. Denies N/V/F/Ch.  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 There is no height or weight on file to calculate BMI. Constitutional Well developed. Well nourished. Oriented to person, place, and time.  Vascular Dorsalis pedis pulses faintly palpable bilaterally. Posterior tibial pulses faintly palpable bilaterally. Capillary refill normal to all digits.  No cyanosis or clubbing noted. Pedal hair growth normal.  Neurologic Normal speech. Epicritic sensation to light touch grossly present bilaterally. Negative tinel sign at tarsal tunnel bilaterally.   Dermatologic Skin texture and turgor are within normal limits.  No open wounds. No skin lesions. Bilateral hallux nails thickened, crumbly texture, dystrophic with impingement at the distal medial and lateral aspects.  No pain to palpation proximal nail borders.  No erythema, edema, signs of infection.  Musculoskeletal: 5 out of 5 muscle strength, no contributing deformities     Assessment:   1. Nail dystrophy   2. Dermatophytosis of nail    Plan:  - Patient  was evaluated and treated and all questions answered.  Nail dystrophy/onychomycosis - Discussed the diagnosis of nail dystrophy with the patient.  We discussed different treatment options ranging from partial nail avulsions, full nail avulsions, debridements.  Patient does have faintly palpable pulses.  I discussed getting an ABI prior to a nail avulsion procedure.  He expresses understanding of this. - Today, performed distal debridement of bilateral hallux nails with removal of impinging skin.  Following debridement, patient expressed satisfaction.  No iatrogenic bleeding. - Return to clinic as needed   Prentice Ovens, DPM AACFAS Fellowship Trained Podiatric Surgeon [facility]      [1]  Current Outpatient Medications:    ACCU-CHEK GUIDE test strip, , Disp: , Rfl:    Accu-Chek Softclix Lancets lancets, daily. for testing, Disp: , Rfl:    albuterol  (ACCUNEB ) 1.25 MG/3ML nebulizer solution, 1.25 mg twice daily with budesonide , Disp: 75 mL, Rfl: 11   albuterol  (VENTOLIN  HFA) 108 (90 Base) MCG/ACT inhaler, Inhale 1-2 puffs into the lungs every 4 (four) hours as needed for wheezing or shortness of breath., Disp: 18 g, Rfl: 11   apixaban (ELIQUIS) 5 MG TABS tablet, Take 5 mg by mouth 2 (two) times daily., Disp: , Rfl:    atorvastatin (LIPITOR) 20 MG tablet, , Disp: , Rfl:    azelastine  (ASTELIN ) 0.1 % nasal spray, Place 1 spray into both nostrils 2 (two) times daily. Use in each nostril as directed, Disp:  90 mL, Rfl: 1   baclofen  (LIORESAL ) 20 MG tablet, Take 20 mg by mouth 3 (three) times daily., Disp: , Rfl: 4   budesonide  (PULMICORT ) 0.5 MG/2ML nebulizer solution, Take 2 mLs (0.5 mg total) by nebulization 2 (two) times daily., Disp: 120 mL, Rfl: 11   busPIRone (BUSPAR) 5 MG tablet, Take 5 mg by mouth 3 (three) times daily., Disp: , Rfl:    citalopram  (CELEXA ) 20 MG tablet, Take 20 mg by mouth daily., Disp: , Rfl:    diltiazem (CARDIZEM CD) 240 MG 24 hr capsule, Take 240 mg by  mouth daily., Disp: , Rfl:    EPINEPHRINE  0.3 mg/0.3 mL IJ SOAJ injection, Inject one dose intramuscularly for allergic reaction. May repeat one dose if needed after 5-15 minutes. Proceed to the ER, Disp: 2 each, Rfl: 1   fluticasone  (FLONASE ) 50 MCG/ACT nasal spray, Place 1 spray into both nostrils daily., Disp: 16 g, Rfl: 5   furosemide (LASIX) 20 MG tablet, Take 20 mg by mouth daily as needed., Disp: , Rfl:    levocetirizine ([name] ) 5 MG tablet, Take 1 tablet (5 mg total) by mouth daily as needed for allergies (Can take an extra dose during flare ups.)., Disp: 180 tablet, Rfl: 2   metFORMIN (GLUCOPHAGE) 500 MG tablet, Take 500 mg by mouth 2 (two) times daily with a meal., Disp: , Rfl:    montelukast  (SINGULAIR ) 10 MG tablet, Take 1 tablet (10 mg total) by mouth at bedtime., Disp: 90 tablet, Rfl: 1   pantoprazole  (PROTONIX ) 40 MG tablet, TAKE 1 TABLET BY MOUTH DAILY 30-60 MINUTES BEFORE THE first meal of THE DAY, Disp: 90 tablet, Rfl: 5   potassium chloride  (KLOR-CON ) 10 MEQ tablet, Take 10 mEq by mouth daily., Disp: , Rfl:    tadalafil (CIALIS) 5 MG tablet, Take 5 mg by mouth daily., Disp: , Rfl:    TRADJENTA 5 MG TABS tablet, Take 5 mg by mouth daily., Disp: , Rfl:    traZODone  (DESYREL ) 100 MG tablet, Take two (2) tablets by mouth at bedtime, Disp: , Rfl:    TRELEGY ELLIPTA 100-62.5-25 MCG/ACT AEPB, Inhale 1 puff into the lungs daily., Disp: , Rfl:  [2]  Social History Tobacco Use  Smoking Status Former   Current packs/day: 0.00   Average packs/day: 1 pack/day for 45.0 years (45.0 ttl pk-yrs)   Types: Cigarettes   Start date: 12/25/1972   Quit date: 12/25/2017   Years since quitting: 6.8  Smokeless Tobacco Never  [3]  Allergies Allergen Reactions   Breztri  Aerosphere [Budeson-Glycopyrrol-Formoterol ] Other (See Comments)    Dizziness/falls   Other Anaphylaxis    [employer]    Iodine     Not sure of reaction   Penicillin G    Shellfish Allergy Swelling

## 2024-11-22 ENCOUNTER — Ambulatory Visit (HOSPITAL_COMMUNITY)
Admission: RE | Admit: 2024-11-22 | Discharge: 2024-11-22 | Disposition: A | Discharge status: Home / Self Care | Source: Ambulatory Visit | Attending: Acute Care | Admitting: Acute Care

## 2024-11-22 DIAGNOSIS — Z122 Encounter for screening for malignant neoplasm of respiratory organs: Secondary | ICD-10-CM | POA: Insufficient documentation

## 2024-11-22 DIAGNOSIS — Z87891 Personal history of nicotine dependence: Secondary | ICD-10-CM | POA: Diagnosis present

## 2024-11-30 ENCOUNTER — Other Ambulatory Visit: Payer: Self-pay

## 2024-11-30 DIAGNOSIS — Z122 Encounter for screening for malignant neoplasm of respiratory organs: Secondary | ICD-10-CM

## 2024-11-30 DIAGNOSIS — Z87891 Personal history of nicotine dependence: Secondary | ICD-10-CM

## 2024-12-10 ENCOUNTER — Ambulatory Visit: Payer: Medicare HMO | Admitting: Allergy & Immunology

## 2024-12-17 ENCOUNTER — Ambulatory Visit: Admitting: Allergy & Immunology

## 2025-01-21 ENCOUNTER — Ambulatory Visit

## 2025-02-22 ENCOUNTER — Ambulatory Visit: Admitting: Internal Medicine
